# Patient Record
Sex: Female | Born: 1985 | Race: White | Hispanic: No | Smoking: Current some day smoker
Health system: Southern US, Community
[De-identification: ages and names within clinical notes are randomized; demographics above are authoritative.]

## PROBLEM LIST (undated history)

## (undated) DIAGNOSIS — E119 Type 2 diabetes mellitus without complications: Secondary | ICD-10-CM

## (undated) DIAGNOSIS — I10 Essential (primary) hypertension: Secondary | ICD-10-CM

## (undated) DIAGNOSIS — F909 Attention-deficit hyperactivity disorder, unspecified type: Secondary | ICD-10-CM

## (undated) DIAGNOSIS — G43909 Migraine, unspecified, not intractable, without status migrainosus: Secondary | ICD-10-CM

## (undated) DIAGNOSIS — R569 Unspecified convulsions: Secondary | ICD-10-CM

## (undated) DIAGNOSIS — G8929 Other chronic pain: Secondary | ICD-10-CM

## (undated) DIAGNOSIS — C9590 Leukemia, unspecified not having achieved remission: Secondary | ICD-10-CM

## (undated) DIAGNOSIS — F419 Anxiety disorder, unspecified: Secondary | ICD-10-CM

## (undated) DIAGNOSIS — N3010 Interstitial cystitis (chronic) without hematuria: Secondary | ICD-10-CM

## (undated) DIAGNOSIS — F32A Depression, unspecified: Secondary | ICD-10-CM

## (undated) DIAGNOSIS — F329 Major depressive disorder, single episode, unspecified: Secondary | ICD-10-CM

## (undated) DIAGNOSIS — R12 Heartburn: Secondary | ICD-10-CM

## (undated) DIAGNOSIS — N889 Noninflammatory disorder of cervix uteri, unspecified: Secondary | ICD-10-CM

## (undated) DIAGNOSIS — K5900 Constipation, unspecified: Secondary | ICD-10-CM

## (undated) DIAGNOSIS — M1990 Unspecified osteoarthritis, unspecified site: Secondary | ICD-10-CM

## (undated) DIAGNOSIS — C539 Malignant neoplasm of cervix uteri, unspecified: Secondary | ICD-10-CM

## (undated) DIAGNOSIS — R87629 Unspecified abnormal cytological findings in specimens from vagina: Secondary | ICD-10-CM

## (undated) DIAGNOSIS — G4730 Sleep apnea, unspecified: Secondary | ICD-10-CM

## (undated) HISTORY — DX: Anxiety disorder, unspecified: F41.9

## (undated) HISTORY — PX: OTHER SURGICAL HISTORY: SHX169

## (undated) HISTORY — DX: Unspecified abnormal cytological findings in specimens from vagina: R87.629

## (undated) HISTORY — DX: Unspecified osteoarthritis, unspecified site: M19.90

## (undated) HISTORY — DX: Sleep apnea, unspecified: G47.30

## (undated) HISTORY — PX: CARDIAC CATHETERIZATION: SHX172

## (undated) HISTORY — DX: Migraine, unspecified, not intractable, without status migrainosus: G43.909

## (undated) HISTORY — DX: Heartburn: R12

## (undated) HISTORY — PX: TUBAL LIGATION: SHX77

## (undated) HISTORY — PX: LEEP: SHX91

## (undated) HISTORY — DX: Unspecified convulsions: R56.9

---
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
## 2005-02-25 ENCOUNTER — Emergency Department: Payer: Self-pay | Admitting: Emergency Medicine

## 2005-05-03 ENCOUNTER — Emergency Department: Payer: Self-pay | Admitting: General Practice

## 2006-02-16 ENCOUNTER — Emergency Department: Payer: Self-pay | Admitting: Unknown Physician Specialty

## 2007-01-12 ENCOUNTER — Emergency Department: Payer: Self-pay | Admitting: Emergency Medicine

## 2007-06-16 ENCOUNTER — Emergency Department: Payer: Self-pay | Admitting: Emergency Medicine

## 2007-06-16 ENCOUNTER — Other Ambulatory Visit: Payer: Self-pay

## 2008-03-27 ENCOUNTER — Ambulatory Visit: Payer: Self-pay | Admitting: Unknown Physician Specialty

## 2008-05-21 ENCOUNTER — Ambulatory Visit: Payer: Self-pay | Admitting: Urology

## 2009-11-30 ENCOUNTER — Emergency Department: Payer: Self-pay | Admitting: Emergency Medicine

## 2009-12-16 ENCOUNTER — Emergency Department: Payer: Self-pay | Admitting: Emergency Medicine

## 2010-05-11 ENCOUNTER — Emergency Department: Payer: Self-pay | Admitting: Emergency Medicine

## 2010-05-21 ENCOUNTER — Emergency Department: Payer: Self-pay | Admitting: Emergency Medicine

## 2010-10-05 ENCOUNTER — Emergency Department: Payer: Self-pay | Admitting: Internal Medicine

## 2011-10-07 ENCOUNTER — Encounter (HOSPITAL_COMMUNITY): Payer: Self-pay | Admitting: *Deleted

## 2011-10-07 DIAGNOSIS — R10819 Abdominal tenderness, unspecified site: Secondary | ICD-10-CM | POA: Insufficient documentation

## 2011-10-07 DIAGNOSIS — R109 Unspecified abdominal pain: Secondary | ICD-10-CM | POA: Insufficient documentation

## 2011-10-07 DIAGNOSIS — N390 Urinary tract infection, site not specified: Secondary | ICD-10-CM | POA: Insufficient documentation

## 2011-10-07 DIAGNOSIS — Z856 Personal history of leukemia: Secondary | ICD-10-CM | POA: Insufficient documentation

## 2011-10-07 DIAGNOSIS — R110 Nausea: Secondary | ICD-10-CM | POA: Insufficient documentation

## 2011-10-07 NOTE — ED Notes (Signed)
Pt reports abd cramping and vomiting dark red blood starting earlier tonight

## 2011-10-08 ENCOUNTER — Emergency Department (HOSPITAL_COMMUNITY)
Admission: EM | Admit: 2011-10-08 | Discharge: 2011-10-08 | Disposition: A | Discharge status: Home / Self Care | Payer: Self-pay | Attending: Emergency Medicine | Admitting: Emergency Medicine

## 2011-10-08 ENCOUNTER — Emergency Department (HOSPITAL_COMMUNITY): Payer: Self-pay

## 2011-10-08 DIAGNOSIS — R110 Nausea: Secondary | ICD-10-CM

## 2011-10-08 DIAGNOSIS — N390 Urinary tract infection, site not specified: Secondary | ICD-10-CM

## 2011-10-08 HISTORY — DX: Noninflammatory disorder of cervix uteri, unspecified: N88.9

## 2011-10-08 HISTORY — DX: Leukemia, unspecified not having achieved remission: C95.90

## 2011-10-08 LAB — URINALYSIS, ROUTINE W REFLEX MICROSCOPIC
Bilirubin Urine: NEGATIVE
Nitrite: POSITIVE — AB
Specific Gravity, Urine: 1.01 (ref 1.005–1.030)
Urobilinogen, UA: 0.2 mg/dL (ref 0.0–1.0)
pH: 6 (ref 5.0–8.0)

## 2011-10-08 LAB — URINE MICROSCOPIC-ADD ON

## 2011-10-08 LAB — PREGNANCY, URINE: Preg Test, Ur: NEGATIVE

## 2011-10-08 MED ORDER — SODIUM CHLORIDE 0.9 % IV BOLUS (SEPSIS)
1000.0000 mL | Freq: Once | INTRAVENOUS | Status: AC
  Administered 2011-10-08: 1000 mL via INTRAVENOUS

## 2011-10-08 MED ORDER — METRONIDAZOLE 500 MG PO TABS
500.0000 mg | ORAL_TABLET | Freq: Once | ORAL | Status: AC
  Administered 2011-10-08: 500 mg via ORAL
  Filled 2011-10-08: qty 1

## 2011-10-08 MED ORDER — ONDANSETRON HCL 4 MG/2ML IJ SOLN: 4.0000 mg | Freq: Once | INTRAMUSCULAR | Status: DC

## 2011-10-08 MED ORDER — METRONIDAZOLE 500 MG PO TABS: 500.0000 mg | ORAL_TABLET | Freq: Two times a day (BID) | ORAL | Status: AC

## 2011-10-08 MED ORDER — ONDANSETRON HCL 4 MG/2ML IJ SOLN
4.0000 mg | Freq: Once | INTRAMUSCULAR | Status: AC
  Administered 2011-10-08: 4 mg via INTRAVENOUS
  Filled 2011-10-08: qty 2

## 2011-10-08 MED ORDER — CIPROFLOXACIN HCL 250 MG PO TABS
250.0000 mg | ORAL_TABLET | Freq: Once | ORAL | Status: AC
  Administered 2011-10-08: 250 mg via ORAL
  Filled 2011-10-08: qty 1

## 2011-10-08 MED ORDER — CIPROFLOXACIN HCL 250 MG PO TABS
250.0000 mg | ORAL_TABLET | Freq: Two times a day (BID) | ORAL | Status: AC
Start: 2011-10-08 — End: 2011-10-18

## 2011-10-08 MED ORDER — MORPHINE SULFATE 2 MG/ML IJ SOLN
2.0000 mg | Freq: Once | INTRAMUSCULAR | Status: AC
  Administered 2011-10-08: 2 mg via INTRAVENOUS
  Filled 2011-10-08: qty 1

## 2011-10-08 MED ORDER — PROMETHAZINE HCL 25 MG PO TABS: 12.5000 mg | ORAL_TABLET | Freq: Four times a day (QID) | ORAL | Status: DC | PRN

## 2011-10-08 NOTE — ED Provider Notes (Signed)
History     CSN: [id number]  Arrival date & time 10/07/11  2318   First MD Initiated Contact with Patient 10/08/11 0032      Chief Complaint  Patient presents with  . Abdominal Pain    (Consider location/radiation/quality/duration/timing/severity/associated sxs/prior treatment) HPI Bethany Little is a 26 y.o. female who presents to the Emergency Department complaining of abdominal cramping that began earlier tonight was associated with vomiting. Vomit had dark red blood in it. Abdominal cramping his lower abdominal pain with no radiation. She has taken no medicines. She denies fever, chills, shortness of breath, chest pain, diarrhea.  No PCP  Past Medical History  Diagnosis Date  . Leukemia   . Cervical abnormality     Past Surgical History  Procedure Date  . Cesarean section   . Leep     No family history on file.  History  Substance Use Topics  . Smoking status: Current Some Day Smoker  . Smokeless tobacco: Not on file  . Alcohol Use: Yes    OB History    Grav Para Term Preterm Abortions TAB SAB Ect Mult Living                  Review of Systems  Constitutional: Negative for fever.       10 Systems reviewed and are negative for acute change except as noted in the HPI.  HENT: Negative for congestion.   Eyes: Negative for discharge and redness.  Respiratory: Negative for cough and shortness of breath.   Cardiovascular: Negative for chest pain.  Gastrointestinal: Positive for nausea, vomiting and abdominal pain.       Hematemesis  Musculoskeletal: Negative for back pain.  Skin: Negative for rash.  Neurological: Negative for syncope, numbness and headaches.  Psychiatric/Behavioral:       No behavior change.    Allergies  Erythromycin; Pertussis vaccines; and Tetracyclines & related  Home Medications   Current Outpatient Rx  Name Route Sig Dispense Refill  . ORPHENADRINE CITRATE ER 100 MG PO TB12 Oral Take 100 mg by mouth 2 (two) times daily as  needed.      BP 130/84  Pulse 85  Temp(Src) 97.9 F (36.6 C) (Oral)  Resp 18  Ht 5\' 4"  (1.626 m)  Wt 170 lb (77.111 kg)  BMI 29.18 kg/m2  SpO2 100%  LMP 09/21/2011  Physical Exam  Nursing note and vitals reviewed. Constitutional: She is oriented to person, place, and time.       Awake, alert, nontoxic appearance.  HENT:  Head: Atraumatic.  Eyes: Right eye exhibits no discharge. Left eye exhibits no discharge.  Neck: Neck supple.  Cardiovascular: Normal rate, normal heart sounds and intact distal pulses.   Pulmonary/Chest: Effort normal. She exhibits no tenderness.  Abdominal: Soft. Bowel sounds are normal. She exhibits no distension. There is tenderness. There is no rebound and no guarding.       Suprapubic tenderness  Genitourinary:       No cva tenderness  Musculoskeletal: She exhibits no tenderness.       Baseline ROM, no obvious new focal weakness.  Neurological: She is alert and oriented to person, place, and time. She has normal reflexes.       Mental status and motor strength appears baseline for patient and situation.  Skin: No rash noted.  Psychiatric: She has a normal mood and affect.    ED Course  Procedures (including critical care time) Results for orders placed during the hospital encounter of 10/08/11  URINALYSIS, ROUTINE W REFLEX MICROSCOPIC      Component Value Range   Color, Urine YELLOW  YELLOW    APPearance CLEAR  CLEAR    Specific Gravity, Urine 1.010  1.005 - 1.030    pH 6.0  5.0 - 8.0    Glucose, UA NEGATIVE  NEGATIVE (mg/dL)   Hgb urine dipstick MODERATE (*) NEGATIVE    Bilirubin Urine NEGATIVE  NEGATIVE    Ketones, ur NEGATIVE  NEGATIVE (mg/dL)   Protein, ur NEGATIVE  NEGATIVE (mg/dL)   Urobilinogen, UA 0.2  0.0 - 1.0 (mg/dL)   Nitrite POSITIVE (*) NEGATIVE    Leukocytes, UA NEGATIVE  NEGATIVE   PREGNANCY, URINE      Component Value Range   Preg Test, Ur NEGATIVE  NEGATIVE   URINE MICROSCOPIC-ADD ON      Component Value Range    Squamous Epithelial / LPF MANY (*) RARE    WBC, UA 0-2  <3 (WBC/hpf)   RBC / HPF 7-10  <3 (RBC/hpf)   Bacteria, UA MANY (*) RARE    Urine-Other CLUE CELLS PRESENT    Dg Abd Acute W/chest  10/08/2011  *RADIOLOGY REPORT*  Clinical Data: Lower abdominal pain, vaginal spotting, vomiting with dark blood.  ACUTE ABDOMEN SERIES (ABDOMEN 2 VIEW & CHEST 1 VIEW)  Comparison: None.  Findings: Normal heart size and pulmonary vascularity.  No focal consolidation in the lungs.  No blunting of costophrenic angles.  Scattered gas and stool in the colon.  No small or large bowel dilatation.  No free intra-abdominal air.  No abnormal air fluid levels.  No radiopaque stones. Intrauterine device projected over the pelvis.  IMPRESSION: No evidence of active pulmonary disease.  Nonobstructive bowel gas pattern.  Original Report Authenticated By: Marlon Pel, M.D.    [phone] NG was placed. No blood in the stomach. NG was discontinued.  MDM  Patient with complaint of abdominal pain and vomiting. MG did not reveal any blood in the stomach. Urinalysis shows a UTI.Initiated antibiotic therapy. Patient was given IV fluids, analgesics, anti-medic, with improvement. Abdominal series negative for acute findings.Pt stable in ED with no significant deterioration in condition.The patient appears reasonably screened and/or stabilized for discharge and I doubt any other medical condition or other [facility] requiring further screening, evaluation, or treatment in the ED at this time prior to discharge.  MDM Reviewed: nursing note and vitals Interpretation: labs and x-ray           Nicoletta Dress. Colon Branch, MD 10/08/11 [phone]

## 2011-10-08 NOTE — Discharge Instructions (Signed)
Your urine pregnancy was negative. Your urine showed a urinary tract infection and bacterial vaginosis. Both require antibiotics. Your x-ray did not show any acute findings. Use a nausea medicine as needed. Take both of the antibiotics until they are finished. Followup with your doctor.Marland Kitchen

## 2012-01-01 ENCOUNTER — Encounter (HOSPITAL_COMMUNITY): Payer: Self-pay

## 2012-01-01 ENCOUNTER — Emergency Department (HOSPITAL_COMMUNITY)
Admission: EM | Admit: 2012-01-01 | Discharge: 2012-01-01 | Disposition: A | Discharge status: Home / Self Care | Payer: Self-pay | Attending: Emergency Medicine | Admitting: Emergency Medicine

## 2012-01-01 DIAGNOSIS — L259 Unspecified contact dermatitis, unspecified cause: Secondary | ICD-10-CM | POA: Insufficient documentation

## 2012-01-01 DIAGNOSIS — Z856 Personal history of leukemia: Secondary | ICD-10-CM | POA: Insufficient documentation

## 2012-01-01 DIAGNOSIS — L255 Unspecified contact dermatitis due to plants, except food: Secondary | ICD-10-CM

## 2012-01-01 DIAGNOSIS — F17200 Nicotine dependence, unspecified, uncomplicated: Secondary | ICD-10-CM | POA: Insufficient documentation

## 2012-01-01 HISTORY — DX: Interstitial cystitis (chronic) without hematuria: N30.10

## 2012-01-01 MED ORDER — PREDNISONE 10 MG PO TABS: 20.0000 mg | ORAL_TABLET | Freq: Every day | ORAL | Status: DC

## 2012-01-01 MED ORDER — DIPHENHYDRAMINE HCL 25 MG PO CAPS
25.0000 mg | ORAL_CAPSULE | Freq: Four times a day (QID) | ORAL | Status: DC | PRN
  Administered 2012-01-01: 25 mg via ORAL
  Filled 2012-01-01: qty 1

## 2012-01-01 MED ORDER — DIPHENHYDRAMINE HCL 25 MG PO CAPS: 25.0000 mg | ORAL_CAPSULE | Freq: Four times a day (QID) | ORAL | Status: DC | PRN

## 2012-01-01 MED ORDER — PREDNISONE 10 MG PO TABS
40.0000 mg | ORAL_TABLET | Freq: Once | ORAL | Status: AC
Start: 2012-01-01 — End: 2012-01-01
  Administered 2012-01-01: 40 mg via ORAL
  Filled 2012-01-01: qty 4

## 2012-01-01 NOTE — ED Notes (Signed)
Started breaking out in a rash on my right forearm, now breaking out on my face and neck per pt. My right hand started going numb while I was at work today per pt.

## 2012-01-01 NOTE — ED Notes (Signed)
Pt presents to ED with rash to right arm as well as face and neck. Pt states she first noticed the breakout on her arms on Wednesday. Pt states clear drainage comes from areas of the rash. Pt describes areas as burning and itching.

## 2012-01-01 NOTE — ED Provider Notes (Signed)
History     CSN: [id number]  Arrival date & time 01/01/12  1924   First MD Initiated Contact with Patient 01/01/12 1956      Chief Complaint  Patient presents with  . Rash    HPI Bethany Little is a 26 y.o. female who presents to the ED for a rash. The rash started 2 days ago after mowing the lawn. The rash started on the right wrist and now has spread to right forearm, face and left arm. The history was provided by the patient.  Past Medical History  Diagnosis Date  . Leukemia   . Cervical abnormality   . Interstitial cystitis     Past Surgical History  Procedure Date  . Cesarean section   . Leep     History reviewed. No pertinent family history.  History  Substance Use Topics  . Smoking status: Current Some Day Smoker  . Smokeless tobacco: Not on file  . Alcohol Use: Yes    OB History    Grav Para Term Preterm Abortions TAB SAB Ect Mult Living                  Review of Systems  Constitutional: Negative for fever, chills, diaphoresis and fatigue.  HENT: Negative for ear pain, congestion, sore throat, facial swelling, neck pain, neck stiffness, dental problem and sinus pressure.   Eyes: Negative for photophobia, pain and discharge.  Respiratory: Negative for cough, chest tightness and wheezing.   Gastrointestinal: Negative for nausea, vomiting, abdominal pain, diarrhea, constipation and abdominal distention.  Genitourinary: Negative for dysuria, frequency, flank pain and difficulty urinating.  Musculoskeletal: Negative for myalgias, back pain and gait problem.  Skin: Positive for rash. Negative for color change.       Itching   Neurological: Negative for dizziness, speech difficulty, weakness, light-headedness, numbness and headaches.  Psychiatric/Behavioral: Negative for confusion and agitation.    Allergies  Erythromycin; Pertussis vaccines; Tetracyclines & related; and Latex  Home Medications   Current Outpatient Rx  Name Route Sig Dispense Refill   . AMITRIPTYLINE HCL 25 MG PO TABS Oral Take 25 mg by mouth at bedtime.    . DESIPRAMINE HCL 25 MG PO TABS Oral Take 25 mg by mouth daily.    Marland Kitchen LEVONORGESTREL 20 MCG/24HR IU IUD Intrauterine 1 each by Intrauterine route once.    Marland Kitchen PROMETHAZINE HCL 25 MG PO TABS Oral Take 25 mg by mouth every 6 (six) hours as needed. For nausea      BP 130/94  Pulse 88  Temp 98.4 F (36.9 C) (Oral)  Resp 18  Ht 5\' 3"  (1.6 m)  Wt 172 lb (78.019 kg)  BMI 30.47 kg/m2  SpO2 100%  LMP 12/01/2011  Physical Exam  Nursing note and vitals reviewed. Constitutional: She is oriented to person, place, and time. She appears well-developed and well-nourished. No distress.       Elevated blood pressure   HENT:  Head: Atraumatic.       Rash to face    Eyes: EOM are normal. Pupils are equal, round, and reactive to light.  Neck: Normal range of motion. Neck supple.  Cardiovascular: Normal rate, regular rhythm and normal heart sounds.   Pulmonary/Chest: Effort normal and breath sounds normal.  Musculoskeletal: Normal range of motion.       Linear rash noted right wrist and elbow. Few vesicular lesions noted face and left arm.   Neurological: She is alert and oriented to person, place, and time. No cranial nerve  deficit.  Skin: Skin is warm and dry.  Psychiatric: She has a normal mood and affect. Her behavior is normal. Judgment and thought content normal.   Assessment: Contact dermitis  Plan:  Prednisone 40 mg    Benadryl 25 mg po   Follow up with derm as needed Medication List  As of 01/01/2012  8:58 PM   START taking these medications         diphenhydrAMINE 25 mg capsule   Commonly known as: BENADRYL   Take 1 capsule (25 mg total) by mouth every 6 (six) hours as needed for itching.      predniSONE 10 MG tablet   Commonly known as: DELTASONE   Take 2 tablets (20 mg total) by mouth daily.         ASK your doctor about these medications         amitriptyline 25 MG tablet   Commonly known as:  ELAVIL      desipramine 25 MG tablet   Commonly known as: NORPRAMIN      levonorgestrel 20 MCG/24HR IUD   Commonly known as: MIRENA      promethazine 25 MG tablet   Commonly known as: PHENERGAN          Where to get your medications    These are the prescriptions that you need to pick up.   You may get these medications from any pharmacy.         diphenhydrAMINE 25 mg capsule   predniSONE 10 MG tablet           Follow-up Information    Follow up with Fran Lowes, MD. (As needed if symptoms worsen)    Contact information:   [address], [place]. [place] [address] [phone]        I have discussed clinical findings with the patient and plan of care. Patient voices understanding. ED Course  Procedures  MDM          Janne Napoleon, NP 01/01/12 [phone]

## 2012-01-02 NOTE — ED Provider Notes (Signed)
Medical screening examination/treatment/procedure(s) were performed by non-physician practitioner and as supervising physician I was immediately available for consultation/collaboration.   Brihanna Devenport, MD 01/02/12 1502 

## 2012-04-03 ENCOUNTER — Encounter (HOSPITAL_COMMUNITY): Payer: Self-pay

## 2012-04-03 ENCOUNTER — Observation Stay (HOSPITAL_COMMUNITY)
Admission: EM | Admit: 2012-04-03 | Discharge: 2012-04-04 | Payer: Managed Care, Other (non HMO) | Attending: Internal Medicine | Admitting: Internal Medicine

## 2012-04-03 DIAGNOSIS — C9590 Leukemia, unspecified not having achieved remission: Secondary | ICD-10-CM

## 2012-04-03 DIAGNOSIS — T43502A Poisoning by unspecified antipsychotics and neuroleptics, intentional self-harm, initial encounter: Secondary | ICD-10-CM | POA: Insufficient documentation

## 2012-04-03 DIAGNOSIS — Y929 Unspecified place or not applicable: Secondary | ICD-10-CM | POA: Insufficient documentation

## 2012-04-03 DIAGNOSIS — F3289 Other specified depressive episodes: Secondary | ICD-10-CM | POA: Insufficient documentation

## 2012-04-03 DIAGNOSIS — R45851 Suicidal ideations: Secondary | ICD-10-CM | POA: Insufficient documentation

## 2012-04-03 DIAGNOSIS — F17200 Nicotine dependence, unspecified, uncomplicated: Secondary | ICD-10-CM | POA: Insufficient documentation

## 2012-04-03 DIAGNOSIS — T43011A Poisoning by tricyclic antidepressants, accidental (unintentional), initial encounter: Secondary | ICD-10-CM | POA: Insufficient documentation

## 2012-04-03 DIAGNOSIS — T43591A Poisoning by other antipsychotics and neuroleptics, accidental (unintentional), initial encounter: Secondary | ICD-10-CM

## 2012-04-03 DIAGNOSIS — F329 Major depressive disorder, single episode, unspecified: Secondary | ICD-10-CM | POA: Insufficient documentation

## 2012-04-03 HISTORY — DX: Depression, unspecified: F32.A

## 2012-04-03 HISTORY — DX: Major depressive disorder, single episode, unspecified: F32.9

## 2012-04-03 LAB — COMPREHENSIVE METABOLIC PANEL
AST: 14 U/L (ref 0–37)
Albumin: 4.1 g/dL (ref 3.5–5.2)
BUN: 9 mg/dL (ref 6–23)
Calcium: 9.5 mg/dL (ref 8.4–10.5)
Chloride: 104 mEq/L (ref 96–112)
Creatinine, Ser: 0.83 mg/dL (ref 0.50–1.10)
Total Bilirubin: 0.3 mg/dL (ref 0.3–1.2)
Total Protein: 7.3 g/dL (ref 6.0–8.3)

## 2012-04-03 LAB — ETHANOL: Alcohol, Ethyl (B): <11 mg/dL (ref 0–11)

## 2012-04-03 LAB — CBC WITH DIFFERENTIAL/PLATELET
Basophils Absolute: 0.1 10*3/uL (ref 0.0–0.1)
Basophils Relative: 1 % (ref 0–1)
Eosinophils Absolute: 0.1 10*3/uL (ref 0.0–0.7)
Eosinophils Relative: 1 % (ref 0–5)
HCT: 43.7 % (ref 36.0–46.0)
Hemoglobin: 14.8 g/dL (ref 12.0–15.0)
MCH: 31.6 pg (ref 26.0–34.0)
MCHC: 33.9 g/dL (ref 30.0–36.0)
MCV: 93.4 fL (ref 78.0–100.0)
Monocytes Absolute: 0.7 10*3/uL (ref 0.1–1.0)
Monocytes Relative: 7 % (ref 3–12)
Neutro Abs: 6.8 10*3/uL (ref 1.7–7.7)
RDW: 12.5 % (ref 11.5–15.5)

## 2012-04-03 LAB — TROPONIN I: Troponin I: <0.3 ng/mL (ref <0.30)

## 2012-04-03 LAB — URINALYSIS, ROUTINE W REFLEX MICROSCOPIC
Glucose, UA: NEGATIVE mg/dL
Ketones, ur: NEGATIVE mg/dL
Leukocytes, UA: NEGATIVE
Nitrite: NEGATIVE
pH: 5.5 (ref 5.0–8.0)

## 2012-04-03 LAB — PREGNANCY, URINE: Preg Test, Ur: NEGATIVE

## 2012-04-03 LAB — RAPID URINE DRUG SCREEN, HOSP PERFORMED
Amphetamines: NOT DETECTED
Opiates: NOT DETECTED

## 2012-04-03 LAB — SALICYLATE LEVEL: Salicylate Lvl: <2 mg/dL — ABNORMAL LOW (ref 2.8–20.0)

## 2012-04-03 LAB — PROTIME-INR
INR: 1 (ref 0.00–1.49)
Prothrombin Time: 13.1 seconds (ref 11.6–15.2)

## 2012-04-03 MED ORDER — SODIUM CHLORIDE 0.9 % IV BOLUS (SEPSIS)
1000.0000 mL | Freq: Once | INTRAVENOUS | Status: AC
  Administered 2012-04-03: 1000 mL via INTRAVENOUS

## 2012-04-03 MED ORDER — SODIUM CHLORIDE 0.9 % IV SOLN: INTRAVENOUS | Status: DC

## 2012-04-03 MED ORDER — SODIUM BICARBONATE 8.4 % IV SOLN
50.0000 meq | Freq: Once | INTRAVENOUS | Status: AC
  Administered 2012-04-03: 50 meq via INTRAVENOUS
  Filled 2012-04-03: qty 50

## 2012-04-03 MED ORDER — ONDANSETRON HCL 4 MG/2ML IJ SOLN: 4.0000 mg | Freq: Three times a day (TID) | INTRAMUSCULAR | Status: DC | PRN

## 2012-04-03 MED ORDER — SODIUM CHLORIDE 0.9 % IV SOLN
Freq: Once | INTRAVENOUS | Status: AC
  Administered 2012-04-03: 1000 mL via INTRAVENOUS

## 2012-04-03 NOTE — ED Notes (Addendum)
Spoke with Gavin Pound from [employer].  Recommended to monitor for prolonged QRS, and seizures.  Recommended overnight medical admission.

## 2012-04-03 NOTE — H&P (Signed)
[facility] History and Physical  Bethany Little ZOX:[id number] DOB: 1985/10/14 DOA: 04/03/2012   PCP: Dr. Audley Hose at [facility] Specialists: Unknown  Chief Complaint: Overdose  HPI: Bethany Little is a 26 y.o. female with a past medical history of depression, cervical abnormalities of unknown type. 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 She, according to the fianc who provided the history, stated that this was her second adverse report and would mean that the patient would lose her job. Patient was very upset. She came home and then about 8:30 PM patient took about 20 tablets of amitriptyline. There is no indication that the patient took any other medications. She was found to be somnolent at home. She apparently tried to cut herself on the left arm. EMS was called and the patient was brought into the hospital. At this time the patient is arousable, however, is not able to provide any history. According to the fianc she doesn't have a history of suicidal behavior in the past. He also tells me that she was diagnosed with some form of cancer recently and is awaiting insurance, so, that 'chemotherapy' can be initiated.  Home Medications: This list does not appear to be accurate. We'll have pharmacy review.  Prior to Admission medications   Medication Sig Start Date End Date Taking? Authorizing Provider  cefdinir (OMNICEF) 300 MG capsule Take 300 mg by mouth 2 (two) times daily. rx'd 03/27/12   Yes Historical Provider, MD  HYDROcodone-acetaminophen (VICODIN) 5-500 MG per tablet Take 1 tablet by mouth every 6 (six) hours as needed. rx'd 03/27/12  For 20 tabs, now empty   Yes Historical Provider, MD  amitriptyline (ELAVIL) 25 MG tablet Take 25 mg by mouth at bedtime.    Historical Provider, MD  desipramine (NORPRAMIN) 25 MG tablet Take 25 mg by mouth daily.    Historical Provider, MD  diphenhydrAMINE (BENADRYL) 25  mg capsule Take 1 capsule (25 mg total) by mouth every 6 (six) hours as needed for itching. 01/01/12 01/11/12  Hope Orlene Och, NP  levonorgestrel (MIRENA) 20 MCG/24HR IUD 1 each by Intrauterine route once.    Historical Provider, MD  predniSONE (DELTASONE) 10 MG tablet Take 2 tablets (20 mg total) by mouth daily. 01/01/12   Hope Orlene Och, NP  promethazine (PHENERGAN) 25 MG tablet Take 25 mg by mouth every 6 (six) hours as needed. For nausea    Historical Provider, MD    Allergies:  Allergies  Allergen Reactions  . Erythromycin Hives    Childhood allergy  . Pertussis Vaccines Hives    Childhood allergy  . Tetracyclines & Related Hives    Childhood allergy  . Latex Itching and Rash    Past Medical History: Past Medical History  Diagnosis Date  . Leukemia   . Cervical abnormality   . Interstitial cystitis   . Depression     Past Surgical History  Procedure Date  . Cesarean section   . Leep     Social History: According to the fianc she lives in [place]. She has a son. There is a history of smoking cigarettes about 1 pack per day. Occasional alcohol use. No illicit drug use.  Living Situation: Lives with her son Activity Level: Independent with daily activities   Family History: Unable to obtain due to mental status  Review of Systems -unable to do due to mental status  Physical Examination  Filed Vitals:  04/03/12 2132 04/03/12 2137  BP:  123/81  Pulse:  92  TempSrc:  Oral  SpO2: 98% 100%   Unfortunately, examination is limited as the patient was not cooperative. She would push my arms away when I was trying to examine her. I cannot get her to be cooperative.  General appearance: distracted, no distress and uncooperative Head: Normocephalic, without obvious abnormality, atraumatic Eyes: conjunctivae/corneas clear. PERRL Resp: clear to auscultation bilaterally Cardio: regular rate and rhythm, S1, S2 normal, no murmur, click, rub or gallop GI: soft, non-tender; bowel  sounds normal; no masses,  no organomegaly Extremities: Superficial lacerations noted on the left forearm. No active bleeding is noted. Pulses: 2+ and symmetric Neurologic: She is asleep. She does open her eyes on painful stimuli. She does move all her extremities. However, she would not cooperative for examination.  Laboratory Data: Results for orders placed during the hospital encounter of 04/03/12 (from the past 48 hour(s))  CBC WITH DIFFERENTIAL     Status: Normal   Collection Time   04/03/12  9:56 PM      Component Value Range Comment   WBC 10.2  4.0 - 10.5 K/uL    RBC 4.68  3.87 - 5.11 MIL/uL    Hemoglobin 14.8  12.0 - 15.0 g/dL    HCT 16.1  09.6 - 04.5 %    MCV 93.4  78.0 - 100.0 fL    MCH 31.6  26.0 - 34.0 pg    MCHC 33.9  30.0 - 36.0 g/dL    RDW 40.9  81.1 - 91.4 %    Platelets 240  150 - 400 K/uL    Neutrophils Relative 66  43 - 77 %    Neutro Abs 6.8  1.7 - 7.7 K/uL    Lymphocytes Relative 25  12 - 46 %    Lymphs Abs 2.6  0.7 - 4.0 K/uL    Monocytes Relative 7  3 - 12 %    Monocytes Absolute 0.7  0.1 - 1.0 K/uL    Eosinophils Relative 1  0 - 5 %    Eosinophils Absolute 0.1  0.0 - 0.7 K/uL    Basophils Relative 1  0 - 1 %    Basophils Absolute 0.1  0.0 - 0.1 K/uL   COMPREHENSIVE METABOLIC PANEL     Status: Normal   Collection Time   04/03/12  9:56 PM      Component Value Range Comment   Sodium 142  135 - 145 mEq/L    Potassium 3.5  3.5 - 5.1 mEq/L    Chloride 104  96 - 112 mEq/L    CO2 28  19 - 32 mEq/L    Glucose, Bld 91  70 - 99 mg/dL    BUN 9  6 - 23 mg/dL    Creatinine, Ser 7.82  0.50 - 1.10 mg/dL    Calcium 9.5  8.4 - 95.6 mg/dL    Total Protein 7.3  6.0 - 8.3 g/dL    Albumin 4.1  3.5 - 5.2 g/dL    AST 14  0 - 37 U/L    ALT 17  0 - 35 U/L    Alkaline Phosphatase 48  39 - 117 U/L    Total Bilirubin 0.3  0.3 - 1.2 mg/dL    GFR calc non Af Amer >90  >90 mL/min    GFR calc Af Amer >90  >90 mL/min   ETHANOL     Status: Normal   Collection Time  04/03/12   9:56 PM      Component Value Range Comment   Alcohol, Ethyl (B) <11  0 - 11 mg/dL   ACETAMINOPHEN LEVEL     Status: Normal   Collection Time   04/03/12  9:56 PM      Component Value Range Comment   Acetaminophen (Tylenol), Serum <15.0  10 - 30 ug/mL   SALICYLATE LEVEL     Status: Abnormal   Collection Time   04/03/12  9:56 PM      Component Value Range Comment   Salicylate Lvl <2.0 (*) 2.8 - 20.0 mg/dL   PROTIME-INR     Status: Normal   Collection Time   04/03/12  9:56 PM      Component Value Range Comment   Prothrombin Time 13.1  11.6 - 15.2 seconds    INR 1.00  0.00 - 1.49   TROPONIN I     Status: Normal   Collection Time   04/03/12  9:56 PM      Component Value Range Comment   Troponin I <0.30  <0.30 ng/mL   PREGNANCY, URINE     Status: Normal   Collection Time   04/03/12 10:11 PM      Component Value Range Comment   Preg Test, Ur NEGATIVE  NEGATIVE   URINALYSIS, ROUTINE W REFLEX MICROSCOPIC     Status: Normal   Collection Time   04/03/12 10:11 PM      Component Value Range Comment   Color, Urine YELLOW  YELLOW    APPearance CLEAR  CLEAR    Specific Gravity, Urine 1.020  1.005 - 1.030    pH 5.5  5.0 - 8.0    Glucose, UA NEGATIVE  NEGATIVE mg/dL    Hgb urine dipstick NEGATIVE  NEGATIVE    Bilirubin Urine NEGATIVE  NEGATIVE    Ketones, ur NEGATIVE  NEGATIVE mg/dL    Protein, ur NEGATIVE  NEGATIVE mg/dL    Urobilinogen, UA 0.2  0.0 - 1.0 mg/dL    Nitrite NEGATIVE  NEGATIVE    Leukocytes, UA NEGATIVE  NEGATIVE MICROSCOPIC NOT DONE ON URINES WITH NEGATIVE PROTEIN, BLOOD, LEUKOCYTES, NITRITE, OR GLUCOSE <1000 mg/dL.  URINE RAPID DRUG SCREEN (HOSP PERFORMED)     Status: Abnormal   Collection Time   04/03/12 10:11 PM      Component Value Range Comment   Opiates NONE DETECTED  NONE DETECTED    Cocaine NONE DETECTED  NONE DETECTED    Benzodiazepines NONE DETECTED  NONE DETECTED    Amphetamines NONE DETECTED  NONE DETECTED    Tetrahydrocannabinol POSITIVE (*) NONE  DETECTED    Barbiturates NONE DETECTED  NONE DETECTED   CK     Status: Normal   Collection Time   04/03/12 10:45 PM      Component Value Range Comment   Total CK 82  7 - 177 U/L     Radiology Reports: No results found.  Electrocardiogram: Independently reviewed. EKG shows a sinus rhythm at 87 beats per minute. Normal axis. Intervals are normal. QRS is 98. No Q waves. No concerning ST or T-wave changes are noted.  Assessment/Plan  Principal Problem:  *Amitriptyline overdose Active Problems:  Suicidal intent  Depression   #1 amitriptyline overdose: Her QRS interval is normal. Continue to monitor her in step down with telemetry. Supportive treatment will be provided. EKG will be repeated. She is appears to be maintaining her airway at this time. Once she is medically stable she will be seen  by behavioral health.  #2 suicidal attempt: As mentioned, above, she'll be seen by behavioral health, when she is medically ready. Sitter will be useful utilized for suicidal precautions.  #3 history of depression: As above,  We'll have pharmacy reconcile her medications as it is unclear if the medication list is accurate. It appears that some of the older medications may be on the list.  Code Status: Full code Family Communication: Her fianc is at the, bedside  Disposition Plan: Unclear at this time   [facility]  [facility] Pager [phone]  If 7PM-7AM, please contact night-coverage [email or web address] Password [facility]  04/03/2012, 11:33 PM

## 2012-04-03 NOTE — ED Provider Notes (Signed)
History   This chart was scribed for Glynn Octave, MD, by Marcina Millard scribe. The patient was seen in room APA02/APA02 and the patient's care was started at 2148.    CSN: [id number]  Arrival date & time 04/03/12  2153   First MD Initiated Contact with Patient 04/03/12 2148      Chief Complaint  Patient presents with  . Drug Overdose    (Consider location/radiation/quality/duration/timing/severity/associated sxs/prior treatment) HPI Comments: Bethany Little is a 26 y.o. female who presents to the Emergency Department complaining of an overdose from taking 20 amitriptyline at 20:30. Her family reports that they are unsure why she took them. She is a Level 5 [employer].    Past Medical History  Diagnosis Date  . Leukemia   . Cervical abnormality   . Interstitial cystitis   . Depression     Past Surgical History  Procedure Date  . Cesarean section   . Leep     History reviewed. No pertinent family history.  History  Substance Use Topics  . Smoking status: Current Some Day Smoker -- 1.0 packs/day  . Smokeless tobacco: Not on file  . Alcohol Use: Yes    OB History    Grav Para Term Preterm Abortions TAB SAB Ect Mult Living                  Review of Systems  Unable to perform ROS: Mental status change   A complete 10 system review of systems was obtained and all systems are negative except as noted in the HPI and PMH.   Allergies  Erythromycin; Pertussis vaccines; Tetracyclines & related; and Latex  Home Medications   Current Outpatient Rx  Name  Route  Sig  Dispense  Refill  . CEFDINIR 300 MG PO CAPS   Oral   Take 300 mg by mouth 2 (two) times daily. rx'd 03/27/12         . HYDROCODONE-ACETAMINOPHEN 5-500 MG PO TABS   Oral   Take 1 tablet by mouth every 6 (six) hours as needed. rx'd 03/27/12  For 20 tabs, now empty         . AMITRIPTYLINE HCL 25 MG PO TABS   Oral   Take 25 mg by mouth at bedtime.         . DESIPRAMINE HCL 25 MG PO TABS  Oral   Take 25 mg by mouth daily.         Marland Kitchen DIPHENHYDRAMINE HCL 25 MG PO CAPS   Oral   Take 1 capsule (25 mg total) by mouth every 6 (six) hours as needed for itching.   30 capsule   0   . LEVONORGESTREL 20 MCG/24HR IU IUD   Intrauterine   1 each by Intrauterine route once.         Marland Kitchen PREDNISONE 10 MG PO TABS   Oral   Take 2 tablets (20 mg total) by mouth daily.   16 tablet   0   . PROMETHAZINE HCL 25 MG PO TABS   Oral   Take 25 mg by mouth every 6 (six) hours as needed. For nausea           BP 97/59  Pulse 91  Resp 14  SpO2 98%  LMP 03/20/2012  Physical Exam  Nursing note and vitals reviewed. Constitutional: She is oriented to person, place, and time. She appears well-developed and well-nourished. No distress.       She is obtunded and arouses to voice commands.  HENT:  Head: Normocephalic and atraumatic.  Eyes: EOM are normal. Pupils are equal, round, and reactive to light.       Her pupils are equal and reactive.  Neck: Normal range of motion. Neck supple. No tracheal deviation present.  Cardiovascular: Normal rate.   Pulmonary/Chest: Effort normal. No respiratory distress.  Abdominal: Soft. She exhibits no distension.  Musculoskeletal: Normal range of motion. She exhibits no edema.  Neurological: She is alert and oriented to person, place, and time.       She follows commands.   Skin: Skin is warm and dry.       She has a superficial laceration to her left forearm.  Psychiatric: She has a normal mood and affect. Her behavior is normal.    ED Course  Procedures (including critical care time)  DIAGNOSTIC STUDIES: Oxygen Saturation is 100% on room air, normal by my interpretation.    COORDINATION OF CARE:  21:57- Performed physical exam. Ordered blood work and an IV.  Labs Reviewed  SALICYLATE LEVEL - Abnormal; Notable for the following:    Salicylate Lvl <2.0 (*)     All other components within normal limits  URINE RAPID DRUG SCREEN (HOSP  PERFORMED) - Abnormal; Notable for the following:    Tetrahydrocannabinol POSITIVE (*)     All other components within normal limits  CBC WITH DIFFERENTIAL  COMPREHENSIVE METABOLIC PANEL  PREGNANCY, URINE  URINALYSIS, ROUTINE W REFLEX MICROSCOPIC  ETHANOL  ACETAMINOPHEN LEVEL  PROTIME-INR  TROPONIN I  CK   No results found.   1. Tricyclic overdose   2. Amitriptyline overdose   3. Depression   4. Suicidal intent       MDM  Intentional overdose of amitriptyline Vicodin around 8:30 PM. Patient is somnolent and unable to provide a history. She is protecting her airway but not awake enough to tolerate charcoal.  QRS normal at this time. Toxicology labs negative. Discussed with poison control recommends admission medically for observation. Benzodiazepines as needed for seizures, bicarbonate as needed for QRS prolongation.   Date: 04/03/2012   Rate: 87  Rhythm: normal sinus rhythm  QRS Axis: normal  Intervals: normal  ST/T Wave abnormalities: normal  Conduction Disutrbances:none  Narrative Interpretation:   Old EKG Reviewed: none available  CRITICAL CARE Performed by: Glynn Octave   Total critical care time: 30  Critical care time was exclusive of separately billable procedures and treating other patients.  Critical care was necessary to treat or prevent imminent or life-threatening deterioration.  Critical care was time spent personally by me on the following activities: development of treatment plan with patient and/or surrogate as well as nursing, discussions with consultants, evaluation of patient's response to treatment, examination of patient, obtaining history from patient or surrogate, ordering and performing treatments and interventions, ordering and review of laboratory studies, ordering and review of radiographic studies, pulse oximetry and re-evaluation of patient's condition.    I personally performed the services described in this documentation, which  was scribed in my presence. The recorded information has been reviewed and is accurate.       Glynn Octave, MD 04/04/12 [phone]

## 2012-04-03 NOTE — ED Notes (Signed)
Family reports pt took 20 amitriptyline approx 8:30pm, unkn reason

## 2012-04-04 ENCOUNTER — Other Ambulatory Visit: Payer: Self-pay

## 2012-04-04 ENCOUNTER — Inpatient Hospital Stay (HOSPITAL_COMMUNITY)
Admission: AD | Admit: 2012-04-04 | Discharge: 2012-04-07 | DRG: 881 | Disposition: A | Discharge status: Home / Self Care | Payer: Medicaid Other | Source: Ambulatory Visit | Attending: Psychiatry | Admitting: Psychiatry

## 2012-04-04 ENCOUNTER — Encounter (HOSPITAL_COMMUNITY): Payer: Self-pay | Admitting: *Deleted

## 2012-04-04 DIAGNOSIS — R42 Dizziness and giddiness: Secondary | ICD-10-CM | POA: Diagnosis not present

## 2012-04-04 DIAGNOSIS — R030 Elevated blood-pressure reading, without diagnosis of hypertension: Secondary | ICD-10-CM | POA: Diagnosis not present

## 2012-04-04 DIAGNOSIS — T438X2A Poisoning by other psychotropic drugs, intentional self-harm, initial encounter: Secondary | ICD-10-CM | POA: Diagnosis present

## 2012-04-04 DIAGNOSIS — Z79899 Other long term (current) drug therapy: Secondary | ICD-10-CM

## 2012-04-04 DIAGNOSIS — F1210 Cannabis abuse, uncomplicated: Secondary | ICD-10-CM | POA: Diagnosis present

## 2012-04-04 DIAGNOSIS — F17200 Nicotine dependence, unspecified, uncomplicated: Secondary | ICD-10-CM | POA: Diagnosis present

## 2012-04-04 DIAGNOSIS — F3289 Other specified depressive episodes: Principal | ICD-10-CM | POA: Diagnosis present

## 2012-04-04 DIAGNOSIS — T43205A Adverse effect of unspecified antidepressants, initial encounter: Secondary | ICD-10-CM | POA: Diagnosis not present

## 2012-04-04 DIAGNOSIS — T43011A Poisoning by tricyclic antidepressants, accidental (unintentional), initial encounter: Secondary | ICD-10-CM | POA: Diagnosis present

## 2012-04-04 DIAGNOSIS — H538 Other visual disturbances: Secondary | ICD-10-CM | POA: Diagnosis not present

## 2012-04-04 DIAGNOSIS — R609 Edema, unspecified: Secondary | ICD-10-CM | POA: Diagnosis not present

## 2012-04-04 DIAGNOSIS — C9590 Leukemia, unspecified not having achieved remission: Secondary | ICD-10-CM | POA: Insufficient documentation

## 2012-04-04 DIAGNOSIS — F329 Major depressive disorder, single episode, unspecified: Secondary | ICD-10-CM

## 2012-04-04 DIAGNOSIS — F411 Generalized anxiety disorder: Secondary | ICD-10-CM | POA: Diagnosis present

## 2012-04-04 DIAGNOSIS — T43502A Poisoning by unspecified antipsychotics and neuroleptics, intentional self-harm, initial encounter: Secondary | ICD-10-CM | POA: Diagnosis present

## 2012-04-04 DIAGNOSIS — R45851 Suicidal ideations: Secondary | ICD-10-CM

## 2012-04-04 LAB — COMPREHENSIVE METABOLIC PANEL
Albumin: 3.2 g/dL — ABNORMAL LOW (ref 3.5–5.2)
Alkaline Phosphatase: 38 U/L — ABNORMAL LOW (ref 39–117)
BUN: 10 mg/dL (ref 6–23)
Calcium: 8.6 mg/dL (ref 8.4–10.5)
Creatinine, Ser: 0.79 mg/dL (ref 0.50–1.10)
GFR calc Af Amer: >90 mL/min (ref >90)
Glucose, Bld: 89 mg/dL (ref 70–99)
Potassium: 3.6 mEq/L (ref 3.5–5.1)
Total Protein: 5.9 g/dL — ABNORMAL LOW (ref 6.0–8.3)

## 2012-04-04 LAB — CBC
HCT: 39 % (ref 36.0–46.0)
MCV: 94.2 fL (ref 78.0–100.0)
RBC: 4.14 MIL/uL (ref 3.87–5.11)
RDW: 12.6 % (ref 11.5–15.5)
WBC: 9 10*3/uL (ref 4.0–10.5)

## 2012-04-04 MED ORDER — MAGNESIUM HYDROXIDE 400 MG/5ML PO SUSP: 30.0000 mL | Freq: Every day | ORAL | Status: DC | PRN

## 2012-04-04 MED ORDER — SODIUM CHLORIDE 0.9 % IJ SOLN
3.0000 mL | Freq: Two times a day (BID) | INTRAMUSCULAR | Status: DC
  Administered 2012-04-04: 3 mL via INTRAVENOUS
  Filled 2012-04-04: qty 3

## 2012-04-04 MED ORDER — ONDANSETRON HCL 4 MG PO TABS
4.0000 mg | ORAL_TABLET | Freq: Four times a day (QID) | ORAL | Status: DC | PRN
  Filled 2012-04-04: qty 1

## 2012-04-04 MED ORDER — NICOTINE 21 MG/24HR TD PT24
21.0000 mg | MEDICATED_PATCH | Freq: Every day | TRANSDERMAL | Status: DC
  Administered 2012-04-05 – 2012-04-07 (×3): 21 mg via TRANSDERMAL
  Filled 2012-04-04 (×5): qty 1

## 2012-04-04 MED ORDER — SODIUM CHLORIDE 0.9 % IV SOLN
INTRAVENOUS | Status: DC
  Administered 2012-04-04 (×2): via INTRAVENOUS

## 2012-04-04 MED ORDER — ACETAMINOPHEN 325 MG PO TABS
650.0000 mg | ORAL_TABLET | Freq: Four times a day (QID) | ORAL | Status: DC | PRN
  Administered 2012-04-04: 650 mg via ORAL
  Filled 2012-04-04: qty 2

## 2012-04-04 MED ORDER — ALUM & MAG HYDROXIDE-SIMETH 200-200-20 MG/5ML PO SUSP: 30.0000 mL | ORAL | Status: DC | PRN

## 2012-04-04 MED ORDER — ONDANSETRON HCL 4 MG/2ML IJ SOLN
4.0000 mg | Freq: Four times a day (QID) | INTRAMUSCULAR | Status: DC | PRN
  Administered 2012-04-04 (×2): 4 mg via INTRAVENOUS
  Filled 2012-04-04: qty 2

## 2012-04-04 MED ORDER — ACETAMINOPHEN 650 MG RE SUPP: 650.0000 mg | Freq: Four times a day (QID) | RECTAL | Status: DC | PRN

## 2012-04-04 MED ORDER — ACETAMINOPHEN 325 MG PO TABS
650.0000 mg | ORAL_TABLET | Freq: Four times a day (QID) | ORAL | Status: DC | PRN
  Administered 2012-04-06 – 2012-04-07 (×3): 650 mg via ORAL

## 2012-04-04 MED ORDER — NICOTINE 14 MG/24HR TD PT24: 1.0000 | MEDICATED_PATCH | Freq: Every day | TRANSDERMAL | Status: DC

## 2012-04-04 MED ORDER — TRAZODONE HCL 50 MG PO TABS
50.0000 mg | ORAL_TABLET | Freq: Every evening | ORAL | Status: DC | PRN
  Administered 2012-04-05 – 2012-04-06 (×4): 50 mg via ORAL
  Filled 2012-04-04 (×4): qty 1
  Filled 2012-04-04 (×2): qty 28
  Filled 2012-04-04 (×2): qty 1
  Filled 2012-04-04: qty 28

## 2012-04-04 MED ORDER — ALBUTEROL SULFATE (5 MG/ML) 0.5% IN NEBU: 2.5000 mg | INHALATION_SOLUTION | RESPIRATORY_TRACT | Status: DC | PRN

## 2012-04-04 MED ORDER — NICOTINE 14 MG/24HR TD PT24
14.0000 mg | MEDICATED_PATCH | Freq: Every day | TRANSDERMAL | Status: DC
  Administered 2012-04-04: 14 mg via TRANSDERMAL
  Filled 2012-04-04: qty 1

## 2012-04-04 NOTE — Tx Team (Signed)
Initial Interdisciplinary Treatment Plan  PATIENT STRENGTHS: (choose at least two) Ability for insight Active sense of humor Average or above average intelligence Capable of independent living Communication skills General fund of knowledge Motivation for treatment/growth Physical Health Work skills  PATIENT STRESSORS: Financial difficulties Health problems Legal issue Marital or family conflict Occupational concerns Substance abuse   PROBLEM LIST: Problem List/Patient Goals Date to be addressed Date deferred Reason deferred Estimated date of resolution  "I didn't want to come, because I know I screwed up"            Depression 04/04/12     Increased risk for suicide 04/04/12     Substance abuse 04/04/12                              DISCHARGE CRITERIA:  Ability to meet basic life and health needs Adequate post-discharge living arrangements Improved stabilization in mood, thinking, and/or behavior Medical problems require only outpatient monitoring Motivation to continue treatment in a less acute level of care Need for constant or close observation no longer present Reduction of life-threatening or endangering symptoms to within safe limits Safe-care adequate arrangements made Verbal commitment to aftercare and medication compliance  PRELIMINARY DISCHARGE PLAN: Attend 12-step recovery group Outpatient therapy Participate in family therapy Return to previous living arrangement Return to previous work or school arrangements  PATIENT/FAMIILY INVOLVEMENT: This treatment plan has been presented to and reviewed with the patient, Bethany Little, and/or family member.  The patient and family have been given the opportunity to ask questions and make suggestions.  Fransico Michael [facility] 04/04/2012, 9:36 PM

## 2012-04-04 NOTE — Addendum Note (Signed)
Addended by: Norval Gable on: 04/04/2012 03:35 PM   Modules accepted: Orders

## 2012-04-04 NOTE — Progress Notes (Signed)
Patient ID: Bethany Little, female   DOB: 11/10/1985, 26 y.o.   MRN: [id number] Pt Was pleasant and cooperative, but tearful and depressed during the adm process. Stated that she's been dealing with a lot of stress but realizes that she did the wrong thing by over dosing on meds. Stated she remembers taking 5, but doesn't remember taking more. Stated she "just wanted to get some sleep".  States she lives with her father, and her boyfriend. States she also has primary custody of her 87yr child.  Pt states she's responsible for paying all the utility bills and buying food. States her boyfriend pays 500.00 child support and is unable to help. Pt has custody hearing over 5 yr and the child is with it's father at this time. Pt's uds tested positive for THC, but she denies using, stating she was around people that were smoking. Report states pt lost her job, but she denies. Stated, "unless they find out I'm in here". Pt became more tearful when she asked if DSS would be called. Informed pt that they may have been notified at the hosp, however she would need to speak with her CM in the morning. Pt denies SI, HI, A/V.

## 2012-04-04 NOTE — BHH Counselor (Signed)
The patient was seen earlier today before medical clearance was given. Later today Dr Brien Few called and informed writer that the patient was now clear and ready for discharge. He agrees that inpatient treatment is indicated and asked that ACT assist in obtaining inpatient treatment for the patient. She is a  [facility] resident and is coverd by [facility]. Called [facility] and they do accept [facility]. They requested that a referral be sent to them for consideration. Patient also referred to [facility].

## 2012-04-04 NOTE — Clinical Social Work Psychosocial (Signed)
    Clinical Social Work Department BRIEF PSYCHOSOCIAL ASSESSMENT 04/04/2012  Patient:  Bethany Little,Bethany Little     Account Number:  [id number]     Admit date:  04/03/2012  Clinical Social Worker:  Santa Genera, CLINICAL SOCIAL WORKER  Date/Time:  04/04/2012 10:00 AM  Referred by:  Physician  Date Referred:  04/04/2012 Referred for  Behavioral Health Issues   Other Referral:   Interview type:  Patient Other interview type:    PSYCHOSOCIAL DATA Living Status:  FAMILY Admitted from facility:   Level of care:   Primary support name:  Clint Bolder Primary support relationship to patient:  FRIEND Degree of support available:   Limited    CURRENT CONCERNS Current Concerns  Behavioral Health Issues   Other Concerns:    SOCIAL WORK ASSESSMENT / PLAN CSW met w patient at bedside, patient alert and oriented. Patient tearful, has been told that she needs inpatient psychiatric hospitalization to treat her depression. Patient overdosed last night, said she "just wanted to go to sleep" and was upset due to second poor review at work and concerns about her 93 year old son.  She is estranged from child's father, patient provides most of the care and support for child.  However, child has frequent visits w father, and patient says father is not supportive of her as a parent.  Patient currently lives w her father, who requires significant payment for living expenses.  Patient is new hire at grocery store, feels she is vulnerable to losing her job due to [employer] of poor performance appraisals and absences.  Patient feels that if she is hospitalized she will lose her job and will also lose custody of her son.  Fears chlid's father will allege she is unable to parent due to mental health concerns.  Patient adamant that she does not want inpatient treatment and states she is willing to do "anything" outpatient.    Patient says this is first suicide attempt, regrets the "trouble it has caused."  Has  sporadic history of psychiatric treatment at [facility], has had multiple providers and many changes in medications.  Feels treatment has been ineffective.  Lives in [place], seeks treatment in [place].  Realizes resources are limited in her area.    Discussed need to follow medical recommendations for needed mental health treatment and patient agreed that she will speak w MD.  However, it is patient's strong desire to be at work tomorrow at 6 AM in order to keep her job.    CSW concluded interview when family members arrived to visit w patient.   Assessment/plan status:  Other - See comment Other assessment/ plan:   CSW will follow recommendations of ACT team and MD and offer support as needed.   Information/referral to community resources:   None at this time    PATIENT'S/FAMILY'S RESPONSE TO PLAN OF CARE: Wants referrals to mental health treatment in her area.    Santa Genera, LCSW Clinical Social Worker [phone])

## 2012-04-04 NOTE — BH Assessment (Addendum)
Assessment Note   Bethany Little is an 26 y.o. female. The patient came to the ED after an intentional overdose on 20 Amitripilyne pills. According to her boyfriend she has received her 2nd bad review at work and will lose her job. She came home and because she was upset she sent her son home with her ex-spouse. After they left she took the pill. She told Clinical research associate that she just wanted  To go to sleep and not wake up. She says she does not know why she did this. She denies any previous attempts on her life. She denies any previous inpatient treatment. She was followed in [facility] by [facility] for a time but is not seeing anyone at this time. She is not on any medications for depression nor anxiety.  She is not homicidal nor does she have a history of violence. She is neither hallucinated nor delusional.  She  Became very tearful when told that she needs jnpatient treatment to resume medications. She cannot contract for safety. Her hospitalist is not available at this time. We will discuss inpatient care after he sees the patient and can provide medical clearance.   Axis I: Major Depression, Recurrent severe Axis II: Deferred Axis III:  Past Medical History  Diagnosis Date  . Leukemia   . Cervical abnormality   . Interstitial cystitis   . Depression    Axis IV: economic problems, occupational problems and problems with access to health care services Axis V: 21-30 behavior considerably influenced by delusions or hallucinations OR serious impairment in judgment, communication OR inability to function in almost all areas  Past Medical History:  Past Medical History  Diagnosis Date  . Leukemia   . Cervical abnormality   . Interstitial cystitis   . Depression     Past Surgical History  Procedure Date  . Cesarean section   . Leep     Family History: History reviewed. No pertinent family history.  Social History:  reports that she has been smoking.  She does not have any smokeless  tobacco history on file. She reports that she drinks alcohol. Her drug history not on file.  Additional Social History:     CIWA: CIWA-Ar BP: 103/75 mmHg Pulse Rate: 82  COWS:    Allergies:  Allergies  Allergen Reactions  . Erythromycin Hives    Childhood allergy  . Pertussis Vaccines Hives    Childhood allergy  . Tetracyclines & Related Hives    Childhood allergy  . Latex Itching and Rash    Home Medications:  Medications Prior to Admission  Medication Sig Dispense Refill  . amitriptyline (ELAVIL) 25 MG tablet Take 25 mg by mouth at bedtime.      . cefdinir (OMNICEF) 300 MG capsule Take 600 mg by mouth daily.      Marland Kitchen desipramine (NORPRAMIN) 25 MG tablet Take 25 mg by mouth daily as needed. Muscle Spasms      . HYDROcodone-acetaminophen (VICODIN) 5-500 MG per tablet Take 1 tablet by mouth every 6 (six) hours as needed. rx'd 03/27/12  For 20 tabs, now empty      . promethazine (PHENERGAN) 25 MG tablet Take 25 mg by mouth every 6 (six) hours as needed. For nausea      . diphenhydrAMINE (BENADRYL) 25 mg capsule Take 1 capsule (25 mg total) by mouth every 6 (six) hours as needed for itching.  30 capsule  0  . levonorgestrel (MIRENA) 20 MCG/24HR IUD 1 each by Intrauterine route once.  OB/GYN Status:  Patient's last menstrual period was 03/20/2012.  General Assessment Data Location of Assessment: [facility] ([facility]) ACT Assessment: Yes Living Arrangements: Spouse/significant other Can pt return to current living arrangement?: Yes Admission Status: Voluntary Is patient capable of signing voluntary admission?: Yes Transfer from: Acute Hospital Referral Source: Medical Floor Inpatient  Education Status Is patient currently in school?: No  Risk to self Suicidal Ideation: Yes-Currently Present Suicidal Intent: Yes-Currently Present Is patient at risk for suicide?: Yes Suicidal Plan?: Yes-Currently Present Specify Current Suicidal Plan: patient took 20 amitriplyne after getting  a bad review at work Access to [employer]: Yes Specify Access to Suicidal Means: had medications at home What has been your use of drugs/alcohol within the last 12 months?: ad;mits to occassional etoh use Previous Attempts/Gestures: No How many times?: 0  Other Self Harm Risks: she did cut her arm superfically Triggers for Past Attempts:  (depression) Intentional Self Injurious Behavior: None Family Suicide History: No Recent stressful life event(s): Financial Problems;Recent negative physical changes Persecutory voices/beliefs?: No Depression: Yes Depression Symptoms: Insomnia;Tearfulness;Isolating;Loss of interest in usual pleasures;Feeling worthless/self pity Substance abuse history and/or treatment for substance abuse?: No (occassional etoh;denies street drugs but was + for [facility]) Suicide prevention information given to non-admitted patients: Not applicable  Risk to Others Homicidal Ideation: No Thoughts of Harm to Others: No Current Homicidal Intent: No Current Homicidal Plan: No Access to Homicidal Means: No History of harm to others?: No Assessment of Violence: None Noted Does patient have access to weapons?: No Criminal Charges Pending?: No Does patient have a court date: No  Psychosis Hallucinations: None noted Delusions: None noted  Mental Status Report Appear/Hygiene: Disheveled Eye Contact: Fair Motor Activity: Freedom of movement;Restlessness Speech: Slurred;Logical/coherent Level of Consciousness: Crying;Sedated Mood: Depressed;Anhedonia Affect: Depressed Anxiety Level: Minimal Thought Processes: Coherent;Relevant Judgement: Unimpaired Orientation: Person;Place;Time;Situation Obsessive Compulsive Thoughts/Behaviors: Minimal  Cognitive Functioning Concentration: Decreased Memory: Recent Impaired;Remote Impaired IQ: Average Insight: Poor Impulse Control: Poor Appetite: Poor Weight Loss: 0  (weight up and down) Weight Gain: 0  (weight up and down) Sleep:  Decreased Total Hours of Sleep: 4  (up and down during the night) Vegetative Symptoms: None  ADLScreening [facility] Assessment Services) Patient's cognitive ability adequate to safely complete daily activities?: Yes Patient able to express need for assistance with ADLs?: Yes Independently performs ADLs?: Yes (appropriate for developmental age)  Abuse/Neglect [facility]) Physical Abuse: Denies Verbal Abuse: Denies Sexual Abuse: Denies  Prior Inpatient Therapy Prior Inpatient Therapy: No  Prior Outpatient Therapy Prior Outpatient Therapy: Yes Prior Therapy Dates: 2012 Prior Therapy Facilty/Provider(s): [facility] [facility] Reason for Treatment: depression/medications  ADL Screening (condition at time of admission) Patient's cognitive ability adequate to safely complete daily activities?: Yes Patient able to express need for assistance with ADLs?: Yes Independently performs ADLs?: Yes (appropriate for developmental age) Weakness of Legs: Both Weakness of Arms/Hands: Both  Home Assistive Devices/Equipment Home Assistive Devices/Equipment: None  Therapy Consults (therapy consults require a physician order) PT Evaluation Needed: No OT Evalulation Needed: No SLP Evaluation Needed: No Abuse/Neglect Assessment (Assessment to be complete while patient is alone) Physical Abuse: Denies Verbal Abuse: Denies Sexual Abuse: Denies Exploitation of patient/patient's resources: Denies Self-Neglect: Denies Values / Beliefs Cultural Requests During Hospitalization: None Spiritual Requests During Hospitalization: None Consults Spiritual Care Consult Needed: No Social Work Consult Needed: Yes (Comment) Advance Directives (For Healthcare) Advance Directive: Patient does not have advance directive;Patient would not like information Pre-existing out of facility DNR order (yellow form or pink MOST form): No Nutrition Screen- [facility]  Adult/[facility] Patient's home diet: Regular Have you recently lost weight  without trying?: No Have you been eating poorly because of a decreased appetite?: No Malnutrition Screening Tool Score: 0   Additional Information 1:1 In Past 12 Months?: No CIRT Risk: No Elopement Risk: No Does patient have medical clearance?: Yes (physician will clear patient after lunch)     Disposition:Remain on medical floor. Spoke with Dr. Brien Few, patient is now medically clear and ready to transfer to inpatient psychiatric care. Patient has been medically cleared by Dr Brien Few. Patient accepted to [facility] as a voluntary admission.  Dr Brien Few in agreement with this disposition. Disposition Disposition of Patient: Other dispositions Other disposition(s):  (remain on medical floor until seen by physician)  On Site Evaluation by:   Reviewed with Physician:     Jearld Pies 04/04/2012 10:25 AM

## 2012-04-04 NOTE — Progress Notes (Signed)
Report called to [facility]. Patient aware of transfer. Tearful but understanding of why she needs to be transferred. Awaiting carelink.

## 2012-04-04 NOTE — Progress Notes (Signed)
UR Chart Review Completed  

## 2012-04-04 NOTE — Progress Notes (Signed)
[facility] PROGRESS NOTE  Jacquana Manteufel WGN:[id number] DOB: [birth year] DOA: 04/03/2012 PCP: No primary provider on file.  Assessment/Plan: Principal Problem:  *Amitriptyline overdose Active Problems:  Suicidal intent  Depression    1. Amitriptyline overdose: Patient ingested about 20 x 25 mg Amitriptyline. Fortunately, she has remained asymptomatic, showed no arrhythmias on telemetry. Repeat EKG shows normal Q-TC.  2. Suicidal attempt: Patient has been evaluated by behavioral health, and is currently on suicide precautions/1:1 Sitter. Inpatient treatment at [facility], has been recommended.  3. History of depression: See above discussion.   Code Status: Full Code. Family Communication:  Disposition Plan: Medically stable for transfer to [facility]. Cleared medically.   Brief narrative: HPI: Bethany Little is a 26 y.o. female with a past medical history of depression, cervical abnormalities of unknown type. 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 She, according to the fianc who provided the history, stated that this was her second adverse report and this would mean that the patient would lose her job. Patient was very upset, came home and then about 8:30 PM, ingested about 20 tablets of amitriptyline, and apparently tried to cut herself on the left arm. She was found to be somnolent at home, EMS was called and the patient was brought into the hospital. According to the fianc, patient has no previous history of suicidal behavior. He also says that she was diagnosed with some form of cancer recently and is awaiting insurance, so, that "chemotherapy" can be initiated.   Consultants:  Psychiatry.   Procedures:  N/A.   Antibiotics:  N/A  HPI/Subjective: Asymptomatic  Objective: Vital signs in last 24 hours: Temp:  [98.5 F (36.9 C)-98.9 F (37.2 C)] 98.9 F (37.2 C) (11/12 0800) Pulse  Rate:  [77-93] 82  (11/12 0900) Resp:  [0-24] 14  (11/12 0800) BP: (95-138)/(56-82) 103/75 mmHg (11/12 0900) SpO2:  [96 %-100 %] 97 % (11/12 0900) Weight:  [82.8 kg (182 lb 8.7 oz)] 82.8 kg (182 lb 8.7 oz) (11/12 0114) Weight change:  Last BM Date: 04/03/12  Intake/Output from previous day: 11/11 0701 - 11/12 0700 In: 566.7 [I.V.:566.7] Out: -  Total I/O In: 303 [I.V.:303] Out: -    Physical Exam: General: Comfortable, alert, communicative, fully oriented, not short of breath at rest.  HEENT:  No clinical pallor, no jaundice, no conjunctival injection or discharge. Hydration is fair.  NECK:  Supple, JVP not seen, no carotid bruits, no palpable lymphadenopathy, no palpable goiter. CHEST:  Clinically clear to auscultation, no wheezes, no crackles. HEART:  Sounds 1 and 2 heard, normal, regular, no murmurs. ABDOMEN:  Full, soft, non-tender, no palpable organomegaly, no palpable masses, normal bowel sounds. GENITALIA:  Not examined. LOWER EXTREMITIES:  No pitting edema, palpable peripheral pulses. MUSCULOSKELETAL SYSTEM:  Unremarkable.  CENTRAL NERVOUS SYSTEM:  No focal neurologic deficit on gross examination.  Lab Results:  Basename 04/04/12 0450 04/03/12 2156  WBC 9.0 10.2  HGB 13.2 14.8  HCT 39.0 43.7  PLT 236 240    Basename 04/04/12 0450 04/03/12 2156  NA 146* 142  K 3.6 3.5  CL 111 104  CO2 27 28  GLUCOSE 89 91  BUN 10 9  CREATININE 0.79 0.83  CALCIUM 8.6 9.5   Recent Results (from the past 240 hour(s))  MRSA PCR SCREENING     Status: Normal   Collection Time   04/04/12  1:13 AM      Component  Value Range Status Comment   MRSA by PCR NEGATIVE  NEGATIVE Final      Studies/Results: No results found.  Medications: Scheduled Meds:   . [COMPLETED] sodium chloride   Intravenous Once  . nicotine  14 mg Transdermal Daily  . [COMPLETED] sodium bicarbonate  50 mEq Intravenous Once  . [COMPLETED] sodium chloride  1,000 mL Intravenous Once  . sodium chloride  3  mL Intravenous Q12H  . [DISCONTINUED] sodium chloride   Intravenous STAT   Continuous Infusions:   . sodium chloride 100 mL/hr at 04/04/12 1122   PRN Meds:.acetaminophen, acetaminophen, albuterol, ondansetron (ZOFRAN) IV, ondansetron, [DISCONTINUED] ondansetron (ZOFRAN) IV    LOS: 1 day   Maysoon Lozada,CHRISTOPHER  [facility] Pager [phone]. If 8PM-8AM, please contact night-coverage at [email or web address], password [facility] 04/04/2012, 12:16 PM  LOS: 1 day

## 2012-04-04 NOTE — BHH Counselor (Signed)
The patient has been accepted to [facility] by K. Karoline Caldwell NP to the service of Dr Daleen Bo. She is assigned to room 503-01. She will be transported by [facility]. She is  an [facility] resident and will be a [facility] sponsorship. Spoke with A Meyers in admission, she will complete [facility] paperwork and fax it in for the [facility]. Support paperwork completed and faxed to [facility]. Dr Brien Few informed of the acceptance .

## 2012-04-04 NOTE — Discharge Summary (Signed)
Physician Discharge Summary  Bethany Little ZOX:[id number] DOB: 25-Nov-1985 DOA: 04/03/2012  PCP: No primary provider on file.  Admit date: 04/03/2012 Discharge date: 04/04/2012  Time spent: 35 minutes  Recommendations for Outpatient Follow-up:  1. Transferred to [facility].   Discharge Diagnoses:  Principal Problem:  *Amitriptyline overdose Active Problems:  Suicidal intent  Depression   Discharge Condition: Satisfactory.  Diet recommendation: Regular.  Filed Weights   04/04/12 0114  Weight: 82.8 kg (182 lb 8.7 oz)    History of present illness:  25 y.o. female with a past medical history of depression, cervical abnormalities of unknown type. 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 She, according to the fianc who provided the history, stated that this was her second adverse report and this would mean that the patient would lose her job. Patient was very upset, came home and then about 8:30 PM, ingested about 20 tablets of amitriptyline, and apparently tried to cut herself on the left arm. She was found to be somnolent at home, EMS was called and the patient was brought into the hospital. According to the fianc, patient has no previous history of suicidal behavior. He also says that she was diagnosed with some form of cancer recently and is awaiting insurance, so, that "chemotherapy" can be initiated.   Hospital Course:  1. Amitriptyline overdose: Patient ingested about 20 x 25 mg Amitriptyline. Fortunately, she remained asymptomatic, showed no arrhythmias on telemetric monitoring. Repeat EKG on 04/04/12, showed normal Q-TC.  2. Suicidal attempt: Patient has been evaluated by behavioral health, and is currently on suicide precautions/1:1 Sitter. Inpatient treatment at [facility], has been recommended.  3. History of depression: See above discussion. 4. Tobacco abuse: Counseled and managed with  Nicoderm CQ patch.    Procedures:  N/A.  Consultations:  Psychiatry.  Discharge Exam: Filed Vitals:   04/04/12 0800 04/04/12 0900 04/04/12 1200 04/04/12 1427  BP: 102/71 103/75  100/58  Pulse: 85 82  93  Temp: 98.9 F (37.2 C)  98.5 F (36.9 C) 98.2 F (36.8 C)  TempSrc: Oral  Oral Oral  Resp: 14   18  Height:      Weight:      SpO2: 98% 97%  100%    General: Comfortable, alert, communicative, fully oriented, not short of breath at rest.  HEENT: No clinical pallor, no jaundice, no conjunctival injection or discharge. Hydration is fair.  NECK: Supple, JVP not seen, no carotid bruits, no palpable lymphadenopathy, no palpable goiter.  CHEST: Clinically clear to auscultation, no wheezes, no crackles.  HEART: Sounds 1 and 2 heard, normal, regular, no murmurs.  ABDOMEN: Full, soft, non-tender, no palpable organomegaly, no palpable masses, normal bowel sounds.  GENITALIA: Not examined.  LOWER EXTREMITIES: No pitting edema, palpable peripheral pulses.  MUSCULOSKELETAL SYSTEM: Unremarkable.  CENTRAL NERVOUS SYSTEM: No focal neurologic deficit on gross examination.  Discharge Instructions      Discharge Orders    Future Orders Please Complete By Expires   Diet general      Increase activity slowly          Medication List     As of 04/04/2012  4:46 PM    STOP taking these medications         amitriptyline 25 MG tablet   Commonly known as: ELAVIL      cefdinir 300 MG capsule   Commonly known as: OMNICEF  desipramine 25 MG tablet   Commonly known as: NORPRAMIN      diphenhydrAMINE 25 mg capsule   Commonly known as: BENADRYL      HYDROcodone-acetaminophen 5-500 MG per tablet   Commonly known as: VICODIN      promethazine 25 MG tablet   Commonly known as: PHENERGAN      TAKE these medications         levonorgestrel 20 MCG/24HR IUD   Commonly known as: MIRENA   1 each by Intrauterine route once.      nicotine 14 mg/24hr patch   Commonly known as:  NICODERM CQ - dosed in mg/24 hours   Place 1 patch onto the skin daily.         Follow-up Information    Please follow up. (To be determined)           The results of significant diagnostics from this hospitalization (including imaging, microbiology, ancillary and laboratory) are listed below for reference.    Significant Diagnostic Studies: No results found.  Microbiology: Recent Results (from the past 240 hour(s))  MRSA PCR SCREENING     Status: Normal   Collection Time   04/04/12  1:13 AM      Component Value Range Status Comment   MRSA by PCR NEGATIVE  NEGATIVE Final      Labs: Basic Metabolic Panel:  Lab 04/04/12 4098 04/03/12 2156  NA 146* 142  K 3.6 3.5  CL 111 104  CO2 27 28  GLUCOSE 89 91  BUN 10 9  CREATININE 0.79 0.83  CALCIUM 8.6 9.5  MG -- --  PHOS -- --   Liver Function Tests:  Lab 04/04/12 0450 04/03/12 2156  AST 12 14  ALT 13 17  ALKPHOS 38* 48  BILITOT 0.4 0.3  PROT 5.9* 7.3  ALBUMIN 3.2* 4.1   No results found for this basename: LIPASE:5,AMYLASE:5 in the last 168 hours No results found for this basename: AMMONIA:5 in the last 168 hours CBC:  Lab 04/04/12 0450 04/03/12 2156  WBC 9.0 10.2  NEUTROABS -- 6.8  HGB 13.2 14.8  HCT 39.0 43.7  MCV 94.2 93.4  PLT 236 240   Cardiac Enzymes:  Lab 04/03/12 2245 04/03/12 2156  CKTOTAL 82 --  CKMB -- --  CKMBINDEX -- --  TROPONINI -- <0.30   BNP: BNP (last 3 results) No results found for this basename: PROBNP:3 in the last 8760 hours CBG: No results found for this basename: GLUCAP:5 in the last 168 hours     Signed:  Maurie Musco,CHRISTOPHER  [facility] 04/04/2012, 4:46 PM

## 2012-04-04 NOTE — Progress Notes (Signed)
Report given to Armanda Magic, RN. Patient ready for transfer to room 212 with suicide sitter.  ACT team to return to do paperwork for inpatient rehabilitation.  Patient resting quietly at present with no acute distress noted.

## 2012-04-05 LAB — TSH: TSH: 0.386 u[IU]/mL (ref 0.350–4.500)

## 2012-04-05 MED ORDER — ONDANSETRON 4 MG PO TBDP
4.0000 mg | ORAL_TABLET | Freq: Four times a day (QID) | ORAL | Status: DC | PRN
  Administered 2012-04-05: 4 mg via ORAL

## 2012-04-05 MED ORDER — FAMOTIDINE 20 MG PO TABS
20.0000 mg | ORAL_TABLET | Freq: Two times a day (BID) | ORAL | Status: DC
  Administered 2012-04-05: 20 mg via ORAL
  Filled 2012-04-05 (×7): qty 1

## 2012-04-05 MED ORDER — ONDANSETRON 4 MG PO TBDP
4.0000 mg | ORAL_TABLET | Freq: Once | ORAL | Status: AC
  Administered 2012-04-05: 4 mg via ORAL
  Filled 2012-04-05: qty 1

## 2012-04-05 MED ORDER — SERTRALINE HCL 25 MG PO TABS
25.0000 mg | ORAL_TABLET | Freq: Every day | ORAL | Status: DC
  Administered 2012-04-05 – 2012-04-07 (×3): 25 mg via ORAL
  Filled 2012-04-05 (×2): qty 1
  Filled 2012-04-05: qty 14
  Filled 2012-04-05 (×4): qty 1
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
## 2012-04-05 NOTE — Tx Team (Signed)
Interdisciplinary Treatment Plan Update (Adult)  Date:  04/05/2012  Time Reviewed:  9:37 AM   Progress in Treatment: Attending groups:   Yes   Participating in groups:  Yes Taking medication as prescribed:  Yes Tolerating medication:  Yes Family/Significant othe contact made: Contact made with family to be made Patient understands diagnosis:  Yes Discussing patient identified problems/goals with staff: Yes Medical problems stabilized or resolved: Yes Denies suicidal/homicidal ideation: Yes Issues/concerns per patient self-inventory:  Other:  New problem(s) identified:  Reason for Continuation of Hospitalization: Anxiety Depression Medication stabilization  Interventions implemented related to continuation of hospitalization:  Medication Management; safety checks q 15 mins  Additional comments:  Estimated length of stay:  2-3 days  Discharge Plan:  Home with outpatient follow up in [facility] goal(s):  Review of initial/current patient goals per problem list:    1.  Goal(s): Eliminate SI/other thoughts of self harm   Met:  Yes  Target date: d/c  As evidenced by: Patient no longer endorsing SI/HI or other thoughts of self harm.    2.  Goal (s):Reduce depression/anxiety (rated at five six)  Met: No  Target date: d/c  As evidenced by: Patient currently rating symptoms at four or below    3.  Goal(s):.stabilize on meds   Met:  No  Target date: d/c  As evidenced by: Patient reports being stabilized on medications - less symptomatic    4.  Goal(s): Refer for outpatient follow up   Met:  No  Target date: d/c  As evidenced by: Follow up appointment scheduled    Attendees: Patient:  Bethany Little 04/05/2012 9:37 AM  Physican:  Patrick North, MD 04/05/2012 9:37 AM  Nursing:  Neill Loft, RN 04/05/2012 9:37 AM   Nursing:   Quintella Reichert, RN 04/05/2012 9:37 AM   Clinical Social Worker:  Juline Patch, LCSW 04/05/2012 9:37 AM     Other: Norval Gable, FNP 04/05/2012 9:37 AM   Other: Teddy Spike, MSW Intern     04/05/2012 04/05/2012 Other:        04/05/2012 9:37 AM

## 2012-04-05 NOTE — Progress Notes (Signed)
[facility] Group Notes:  (Counselor/Nursing/MHT/Case Management/Adjunct)  04/05/2012 8:10PM  Type of Therapy:  Group Therapy  Participation Level:  Active  Participation Quality:  Attentive, Sharing and Supportive  Affect:  Flat  Cognitive:  Alert and Oriented  Insight:  Good  Engagement in Group:  Good  Engagement in Therapy:  Good  Modes of Intervention:  Clarification, Problem-solving, Socialization and Support  Summary of Progress/Problems: Pt reported that she realized that she needs to change her environment. Pt reported that a coping strategy that she uses is to sit in her room and have some alone time. Pt stated that she is looking forward to going home and being with her son and fiance. Pt reported that her two main supports are her grandparents and her fiance. Pt reported that coping strategies after her discharge is for outpatient therapy, regular therapy sessions, and to concentrate more on herself and learn to understand her cancer.   Jennifier Smitherman, Randal Buba 04/05/2012, 9:23 PM

## 2012-04-05 NOTE — Progress Notes (Signed)
[facility] Group Notes:  (Counselor/Nursing/MHT/Case Management/Adjunct)  04/05/2012 5:05 PM  Type of Therapy:  Psychoeducational Skills  Participation Level:  Active  Participation Quality:  Appropriate, Attentive and Sharing  Affect:  Appropriate  Cognitive:  Alert, Appropriate and Oriented  Insight:  Good  Engagement in Group:  Good  Engagement in Therapy:  n/a  Modes of Intervention:  Activity, Education, Problem-solving, Socialization and Support  Summary of Progress/Problems: [name] attended a psychoeducational group on labels. 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 [name] was active while group discussed what labels are, how they change the way we think about and perceive the world, and listed positive and negative labels they have used or been called. [name] was given a homework assignment to list 10 words she has been labeled and to find the reality of that situation/label.     Bethany Little 04/05/2012, 5:05 PM

## 2012-04-05 NOTE — Progress Notes (Signed)
  Aftercare Planning Group: 04/05/2012 8:45 AM  Kathryn did not attend group this morning.  Will work to [employer] in group later this afternoon and being developing a aftercare plan.    [facility] Group Notes:  (Counselor/Nursing/MHT/Case Management/Adjunct)     Emotional Regulation 1:15 - 2:30 PM            04/05/2012 1:15 PM  Type of Therapy:  Group Therapy  Participation Level:  Minimal  Participation Quality:  Appropriate and Attentive  Affect:  Appropriate  Cognitive:  Alert and Appropriate  Insight:  Limited  Engagement in Group:  Limited  Engagement in Therapy:  Limited  Modes of Intervention:  Education, Problem-solving, Support and Exploration  Summary of Progress/Problems:  Tiffney stated she has difficulty with her emotions.  She stated it feels like for every step forward, someone pulls her twenty step backwards.  She advised her son is the only thing that keeps her going.   Hannah Nail, LCSW 04/05/2012 10:29 AM

## 2012-04-05 NOTE — Progress Notes (Signed)
Psychoeducational Group Note  Date:  04/05/2012 Time:  1100  Group Topic/Focus:  Personal Choices and Values:   The focus of this group is to help patients assess and explore the importance of values in their lives, how their values affect their decisions, how they express their values and what opposes their expression.  Participation Level: Did Not Attend  Participation Quality:  Not Applicable  Affect:  Not Applicable  Cognitive:  Not Applicable  Insight:  Not Applicable  Engagement in Group: Not Applicable  Additional Comments:  Patient did not attend group.  Karleen Hampshire Brittini 04/05/2012, 7:06 PM
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
## 2012-04-05 NOTE — Progress Notes (Signed)
D: Patient resting in bed with eyes closed.  Respirations even and unlabored.  Patient appears to be in no apparent distress. A: Staff to monitor Q 15 mins for safety.   R:Patient remains safe on the unit.  

## 2012-04-05 NOTE — Progress Notes (Signed)
D: Patient in her room sitting up in bed on approach.  Patient states she had been depressed today but it got better after her boyfriend came to visit her.  Patient states she knew that something was off and was not feeling well prior to being admitted.  Patient states she did not remember taking pills with intent of harming herself.  Patient states she has a 28 year old son who is staying with his father. Patient state she is sad and feeling depressed.  Patient denies SI/HI and denies AVH.  A: Staff to monitor Q 15 mins for safety.  Encouragement and support offered.  Scheduled medications administered per orders. Zofran administered prn for nausea. R: Patient remains safe on the unit.  Patient attended wrap-up group tonight.  Patient calm,cooperative and taking administered medications. Patient states she felt relief from Zofran.

## 2012-04-05 NOTE — Progress Notes (Signed)
D: Patient pleasant and cooperative with staff. Patient c/o nausea. She reported on self inventory sheet that her energy level is low and ability to pay attention is good. Patient rated depression "3" and feelings of hopelessness "4".    A: Support and encouragement provided to patient. Administered scheduled medications per MD orders. Maintain 15 minute checks for safety.   R: Patient receptive. Patient reported relief of nausea. Denies SI/HI/AVH. Patient remains safe.

## 2012-04-06 ENCOUNTER — Encounter (HOSPITAL_COMMUNITY): Payer: Self-pay | Admitting: Family Medicine

## 2012-04-06 DIAGNOSIS — T43011A Poisoning by tricyclic antidepressants, accidental (unintentional), initial encounter: Secondary | ICD-10-CM

## 2012-04-06 DIAGNOSIS — R030 Elevated blood-pressure reading, without diagnosis of hypertension: Secondary | ICD-10-CM

## 2012-04-06 DIAGNOSIS — T43591A Poisoning by other antipsychotics and neuroleptics, accidental (unintentional), initial encounter: Secondary | ICD-10-CM

## 2012-04-06 NOTE — Consult Note (Signed)
[facility] Medical Consultation  Prerana Xxxhutchins UJW:[id number] DOB: 1985/12/18 DOA: 04/04/2012 PCP: No primary provider on file. Dr. Audley Hose at [facility]  Requesting physician: Henrietta Dine, MD Date of consultation: 04/06/2012 Reason for consultation: hand swelling  Impression/Recommendations 1. Adverse drug reaction to Elavil--constellation of signs and symptoms consistent with reaction to Elavil (edema, thirst, hypertension, dizziness, blurred vision, palpitations). Main complaint of edema already improved. Except spontaneous improvement to continue. 2. Reported upper/lower extremity edema--no noted upper extremity/hand edema. Trace bilateral LE edema. Likely secondary to ADR and is spontaneously resolving. No further suggestions. 3. Elevated blood pressure--no history of HTN, likely secondary to stress, possibly Elavil. Recommend outpatient follow-up. No medications suggested at this time.  Patient knows to follow-up with gynecologist. No further recommendations. I will sign off. Please call with questions. Thank you for consultation.  Brendia Sacks, MD [facility] Team 2 Pager [phone]  If 8PM-8AM, please contact night-coverage [email or web address] Password [email or web address]  Chief Complaint: hand swelling  HPI:  26 year old woman initially admitted for Elavil overdose and subsequently transferred to [facility] for inpatient treatment. Noted edema of hands last night and had difficulty manipulating and removing ring this morning. Also noted LE edema bilaterally, elevated blood pressure, some dizziness, blurred vision at times, thirst. New medications--Zoloft, trazodone, Pepcid.  I was asked to comment on hand swelling and elevated blood pressure. Patient denies history of HTN. She does report a cervix problem that is actively followed by her GYN and has had LEEP in the past.  Patient reports hand and leg edema better this evening.  Review of Systems:  Negative for fever, sore throat, rash, new  muscle aches, chest pain, SOB, bleeding, n/v/  Positive for chronic dysuria (interstitial cystitis).  Past Medical History  Diagnosis Date  . Cervical abnormality   . Interstitial cystitis   . Depression   . Leukemia     Remission for 23 yrs   Past Surgical History  Procedure Date  . Cesarean section   . Leep    Social History:  reports that she has been smoking Cigarettes.  She has a 5 pack-year smoking history. She does not have any smokeless tobacco history on file. She reports that she drinks alcohol. She reports that she uses illicit drugs (Marijuana).  Allergies  Allergen Reactions  . Erythromycin Hives    Childhood allergy  . Pertussis Vaccines Hives    Childhood allergy  . Tetracyclines & Related Hives    Childhood allergy  . Latex Itching and Rash   Family History  Problem Relation Age of Onset  . Depression Mother     Prior to Admission medications   Medication Sig Start Date End Date Taking? Authorizing Provider  amitriptyline (ELAVIL) 25 MG tablet Take 25 mg by mouth at bedtime.   Yes Historical Provider, MD  cefdinir (OMNICEF) 300 MG capsule Take 600 mg by mouth daily.   Yes Historical Provider, MD  desipramine (NORPRAMIN) 25 MG tablet Take 25 mg by mouth daily as needed. For muscle spasms   Yes Historical Provider, MD  HYDROcodone-acetaminophen (VICODIN) 5-500 MG per tablet Take 1 tablet by mouth every 6 (six) hours as needed. For pain   Yes Historical Provider, MD  promethazine (PHENERGAN) 25 MG tablet Take 25 mg by mouth every 6 (six) hours as needed. For nausea   Yes Historical Provider, MD  levonorgestrel (MIRENA) 20 MCG/24HR IUD 1 each by Intrauterine route once.    Historical Provider, MD   Physical Exam: Filed Vitals:   04/05/12 6578 04/05/12 0745  04/06/12 0600 04/06/12 0601  BP: 124/87 132/86 136/92 141/92  Pulse: 83 86 83 86  Temp: 98.6 F (37 C)  98 F (36.7 C)   TempSrc: Oral     Resp: 15  16     General:  Appears calm and  comfortable Eyes: PERRL, normal lids, irises ENT: grossly normal hearing, lips & tongue Neck: no LAD, masses or thyromegaly Cardiovascular: RRR, no m/r/g. Trace bilateral LE edema. No appreciable hand edema bilaterally. Ring left ring finger is loose and easily manipulated. Bony landmarks both hands easily visible. Respiratory: CTA bilaterally, no w/r/r. Normal respiratory effort. Abdomen: soft, ntnd Skin: no rash or induration seen on limited exam Musculoskeletal: grossly normal tone BUE/BLE Psychiatric: grossly normal mood and affect, speech fluent and appropriate  Labs on Admission:  Basic Metabolic Panel:  Lab 04/04/12 1610 04/03/12 2156  NA 146* 142  K 3.6 3.5  CL 111 104  CO2 27 28  GLUCOSE 89 91  BUN 10 9  CREATININE 0.79 0.83  CALCIUM 8.6 9.5  MG -- --  PHOS -- --   Liver Function Tests:  Lab 04/04/12 0450 04/03/12 2156  AST 12 14  ALT 13 17  ALKPHOS 38* 48  BILITOT 0.4 0.3  PROT 5.9* 7.3  ALBUMIN 3.2* 4.1   CBC:  Lab 04/04/12 0450 04/03/12 2156  WBC 9.0 10.2  NEUTROABS -- 6.8  HGB 13.2 14.8  HCT 39.0 43.7  MCV 94.2 93.4  PLT 236 240   Cardiac Enzymes:  Lab 04/03/12 2245 04/03/12 2156  CKTOTAL 82 --  CKMB -- --  CKMBINDEX -- --  TROPONINI -- <0.30    Principal Problem:  *Depression Active Problems:  Amitriptyline overdose    Time spent: 35 minutes  Brendia Sacks, MD [facility] Team 2 Pager [phone]  If 8PM-8AM, please contact night-coverage [email or web address] Password [facility] 04/06/2012, 6:57 PM

## 2012-04-06 NOTE — Progress Notes (Signed)
Psychoeducational Group Note  Date:  04/06/2012 Time:  10:00 AM  Group Topic/Focus:  Therapeutic Activity - Question Ball  Participation Level:  Active  Participation Quality:  Appropriate  Affect:  Appropriate  Cognitive:  Alert and Oriented  Insight:  Good  Engagement in Group:  Good  Additional Comments:    Melanee Spry 04/06/2012, 6:29 PM
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
## 2012-04-06 NOTE — Progress Notes (Signed)
Psychoeducational Group Note  Date:  04/06/2012 Time:  1100  Group Topic/Focus:  Rediscovering Joy:   The focus of this group is to explore various ways to relieve stress in a positive manner.  Participation Level:  Active  Participation Quality:  Appropriate, Attentive and Sharing  Affect:  Appropriate  Cognitive:  Appropriate  Insight:  Good  Engagement in Group:  Good  Additional Comments:  Patient participated in group of rediscovering joy. Patient shared and stated ways humor and laughter affected the mind and body. Patient then stated others ways to have joy apart of life as a coping skill. Patient was asked to give one thing that brings joy to patient when it is related to five senses. Patient understood that using the five senses as tool was a way of being pro-active and coping skills.   Karleen Hampshire Brittini 04/06/2012, 4:31 PM

## 2012-04-06 NOTE — Progress Notes (Signed)
Aftercare Planning Group:  04/06/2012 8:30 AM   Patient rates all symptoms at zero. She is not endorsing SI or HI at this time.  Patient communicates that she would like a letter for work. She is interested in follow up with [facility] in [place].    [facility] Group Notes:  (Counselor/Nursing/MHT/Case Management/Adjunct)  04/06/2012 10:39 AM  Type of Therapy:  Group Therapy  Participation Level:  Minimal  Participation Quality:  Appropriate and Attentive  Affect:  Appropriate  Cognitive:  Alert and Appropriate  Insight:  Good  Engagement in Group:  Limited  Engagement in Therapy:  Limited  Modes of Intervention:  Clarification, Problem-solving and Support  Summary of Progress/Problems:  Bethany Little listened attentively to speaker from [employer].  She shared that her strength is that she is caring.  She stated group would be surprised to learn she is a three time cancer survivor.  She noted she has been battling cancer for 28 years.   Bethany Little, Bethany Little 04/06/2012, 10:39 AM

## 2012-04-06 NOTE — Progress Notes (Signed)
Patient states over the past few days her hands have been swelling.  No pitting edema noted however patient cannot get her ring off her ring finger and she was able to last night.

## 2012-04-06 NOTE — Progress Notes (Signed)
Nutrition Brief Note  Patient identified on the Malnutrition Screening Tool (MST) Report  Current wt: 182 lbs, wt gain. Wt Readings from Last 10 Encounters:  04/04/12 182 lb 8.7 oz (82.8 kg)  01/01/12 172 lb (78.019 kg)  10/07/11 170 lb (77.111 kg)    Current diet order is Regular and pt is consuming approximately 100% of meals at this time. Labs and medications reviewed.   Pt with wt gain.  Notes wt fluctuates and denies concern for nutrition or wt status.  No nutrition interventions warranted at this time. If nutrition issues arise, please consult RD.   Loyce Dys, MS RD LDN Clinical Inpatient Dietitian Pager: [phone] Weekend/After hours pager: [phone]

## 2012-04-06 NOTE — Progress Notes (Signed)
[facility] MD Progress Note  04/06/2012 11:16 AM Bethany Little  MRN:  [id number]  S: Patient reports her mood is improved. Having swelling all over body. BP is elevated, having increased thirst. Poor appetite.  Diagnosis:   Axis I: Depressive Disorder NOS Axis II: No diagnosis Axis III:  Past Medical History  Diagnosis Date  . Cervical abnormality   . Interstitial cystitis   . Depression   . Leukemia     Remission for 23 yrs   Axis IV: occupational problems and other psychosocial or environmental problems Axis V: 51-60 moderate symptoms  ADL's:  Intact  Sleep: Fair  Appetite:  Poor  Mental Status Examination/Evaluation: Objective:  Appearance: Casual, looks puffy in the face  Eye Contact::  Good  Speech:  Clear and Coherent  Volume:  Normal  Mood:  Anxious and Depressed  Affect:  Full Range  Thought Process:  Coherent  Orientation:  Full  Thought Content:  WDL  Suicidal Thoughts:  No  Homicidal Thoughts:  No  Memory:  Immediate;   Fair Recent;   Fair Remote;   Fair  Judgement:  Fair  Insight:  Fair  Psychomotor Activity:  Normal  Concentration:  Fair  Recall:  Fair  Akathisia:  No  Handed:  Right  AIMS (if indicated):     Assets:  Communication Skills Desire for Improvement Housing  Sleep:  Number of Hours: 6.75    Vital Signs:Blood pressure 141/92, pulse 86, temperature 98 F (36.7 C), temperature source Oral, resp. rate 16, last menstrual period 03/20/2012. Current Medications: Current Facility-Administered Medications  Medication Dose Route Frequency Provider Last Rate Last Dose  . acetaminophen (TYLENOL) tablet 650 mg  650 mg Oral Q6H PRN Kerry Hough, PA   650 mg at 04/06/12 4098  . alum & mag hydroxide-simeth (MAALOX/MYLANTA) 200-200-20 MG/5ML suspension 30 mL  30 mL Oral Q4H PRN Kerry Hough, PA      . famotidine (PEPCID) tablet 20 mg  20 mg Oral BID Kerry Hough, PA   20 mg at 04/05/12 2118  . magnesium hydroxide (MILK OF MAGNESIA)  suspension 30 mL  30 mL Oral Daily PRN Kerry Hough, PA      . nicotine (NICODERM CQ - dosed in mg/24 hours) patch 21 mg  21 mg Transdermal Q0600 Kerry Hough, PA   21 mg at 04/06/12 0643  . [COMPLETED] ondansetron (ZOFRAN-ODT) disintegrating tablet 4 mg  4 mg Oral Once Maryland Luppino, MD   4 mg at 04/05/12 1326  . ondansetron (ZOFRAN-ODT) disintegrating tablet 4 mg  4 mg Oral Q6H PRN Kerry Hough, PA   4 mg at 04/05/12 2057  . sertraline (ZOLOFT) tablet 25 mg  25 mg Oral Daily Lura Falor, MD   25 mg at 04/06/12 0933  . traZODone (DESYREL) tablet 50 mg  50 mg Oral QHS,MR X 1 Kerry Hough, PA   50 mg at 04/05/12 2229    Lab Results:  Results for orders placed during the hospital encounter of 04/04/12 (from the past 48 hour(s))  TSH     Status: Normal   Collection Time   04/05/12  6:21 AM      Component Value Range Comment   TSH 0.386  0.350 - 4.500 uIU/mL     Physical Findings: AIMS: Facial and Oral Movements Muscles of Facial Expression: None, normal Lips and Perioral Area: None, normal Jaw: None, normal Tongue: None, normal,Extremity Movements Upper (arms, wrists, hands, fingers): None, normal Lower (legs, knees, ankles,  toes): None, normal, Trunk Movements Neck, shoulders, hips: None, normal, Overall Severity Severity of abnormal movements (highest score from questions above): None, normal Incapacitation due to abnormal movements: None, normal Patient's awareness of abnormal movements (rate only patient's report): No Awareness, Dental Status Current problems with teeth and/or dentures?: No Does patient usually wear dentures?: No  CIWA:    COWS:     Treatment Plan Summary: Daily contact with patient to assess and evaluate symptoms and progress in treatment Medication management  Plan: Obtain hospitalist consult to evaluate swelling and high BP. Continue current plan of care. Plan for discharge tomorrow. Bethany Little 04/06/2012, 11:16 AM

## 2012-04-06 NOTE — Progress Notes (Signed)
D: Patient appropriate and cooperative with staff and peers. Sad and tearful at times. Patient complained of headache 8/10. Patient attending groups throughout the day.   A: Support and encouragement provided to patient. Scheduled medications administered per MD orders. PRN Tylenol given for pain. Maintain 15 minute checks for safety.  R: Patient receptive. Denies SI/HI/AVH. Patient remains safe.
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
## 2012-04-07 DIAGNOSIS — F329 Major depressive disorder, single episode, unspecified: Principal | ICD-10-CM

## 2012-04-07 MED ORDER — SERTRALINE HCL 25 MG PO TABS: 25.0000 mg | ORAL_TABLET | Freq: Every day | ORAL | Status: DC

## 2012-04-07 MED ORDER — TRAZODONE HCL 50 MG PO TABS: 50.0000 mg | ORAL_TABLET | Freq: Every evening | ORAL | Status: DC | PRN

## 2012-04-07 MED ORDER — LEVONORGESTREL 20 MCG/24HR IU IUD: 1.0000 | INTRAUTERINE_SYSTEM | Freq: Once | INTRAUTERINE | Status: DC

## 2012-04-07 NOTE — Progress Notes (Signed)
Patient attended 11 am Psychoeducational group.  Group consists of therapeutic question ball along with long and short term goals 

## 2012-04-07 NOTE — [facility] Suicide Risk Assessment (Signed)
Suicide Risk Assessment  Discharge Assessment     Demographic Factors:  Caucasian  Mental Status Per Nursing Assessment::   On Admission:  NA  Current Mental Status by Physician: In full contact with reality. There are no suicidal ideas, plans or intent. Her mood is euthymic, her affect is bright, broad. Expresses readiness to be discharge and follow up on outpatient basis  Loss Factors: Decline in physical health  Historical Factors: NA  Risk Reduction Factors:   Sense of responsibility to family, Employed, Living with another person, especially a relative, Positive social support and Positive coping skills or problem solving skills  Continued Clinical Symptoms: Depression improved.    Cognitive Features That Contribute To Risk: No evidence  Suicide Risk:  Minimal: No identifiable suicidal ideation.  Patients presenting with no risk factors but with morbid ruminations; may be classified as minimal risk based on the severity of the depressive symptoms  Discharge Diagnoses:   AXIS I:  Major Depression, single episode AXIS II:  Deferred AXIS III:   Past Medical History  Diagnosis Date  . Cervical abnormality   . Interstitial cystitis   . Depression   . Leukemia     Remission for 23 yrs   AXIS IV:  occupational problems AXIS V:  61-70 mild symptoms  Plan Of Care/Follow-up recommendations:  Activity:  As tolerated Diet:  Regular  Is patient on multiple antipsychotic therapies at discharge:  No   Has Patient had three or more failed trials of antipsychotic monotherapy by history:  No  Recommended Plan for Multiple Antipsychotic Therapies: N/A   Dajana Gehrig A 04/07/2012, 12:20 PM

## 2012-04-07 NOTE — Progress Notes (Signed)
D: Pt in bed resting with eyes closed. Respirations even and unlabored. Pt appears to be in no signs of distress at this time. A: Q15min checks remains for this pt. R: Pt remains safe at this time.   
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
## 2012-04-07 NOTE — Progress Notes (Signed)
[facility] Adult Inpatient Family/Significant Other Suicide Prevention Education  Suicide Prevention Education:  Education Completed; Bethany Little (Father) [phone] has been identified by the patient as the family member/significant other with whom the patient will be residing, and identified as the person(s) who will aid the patient in the event of a mental health crisis (suicidal ideations/suicide attempt).  With written consent from the patient, the family member/significant other has been provided the following suicide prevention education, prior to the and/or following the discharge of the patient.  The suicide prevention education provided includes the following:  Suicide risk factors  Suicide prevention and interventions  National Suicide Hotline telephone number  [facility] assessment telephone number  [facility] Emergency Assistance 911  [facility] and/or Residential Mobile Crisis Unit telephone number  Request made of family/significant other to:  Remove weapons (e.g., guns, rifles, knives), all items previously/currently identified as safety concern.    Remove drugs/medications (over-the-counter, prescriptions, illicit drugs), all items previously/currently identified as a safety concern.  The family member/significant other verbalizes understanding of the suicide prevention education information provided.  The family member/significant other agrees to remove the items of safety concern listed above.  Bethany Little, Bethany Little 04/07/2012, 1:51 PM

## 2012-04-07 NOTE — Progress Notes (Signed)
[facility] Case Management Discharge Plan:  Will you be returning to the same living situation after discharge: Yes,  Patient returning home with family At discharge, do you have transportation home?:Yes,  Patient has arranged transportation home Do you have the ability to pay for your medications:Yes,  Patient has insurance and able to make co-payment  Interagency Information:     Release of information consent forms completed and in the chart;  Patient's signature needed at discharge.  Patient to Follow up at:  Follow-up Information    Follow up with Florencia Reasons [facility]. On 04/11/2012. (You are scheduled with Florencia Reasons on Tuesday, April 11, 2012 at 9:45 AM)    Contact information:   [address] [place], [place]  [address]  [phone]        Follow up with Dr. Dan Humphreys - [facility] [employer]. On 05/05/2012. (You are scheduled to see Dr. Dan Humphreys on Friday, May 05, 2012 15 8:45 am)    Contact information:   [address] [place], [place]  [address]  [phone]         Patient denies SI/HI:   Yes,  Patient denies SI/HI and other thoughts of self harm    Safety Planning and Suicide Prevention discussed:  Yes,  Reviewed during after care group  Barrier to discharge identified:Yes,  Limited supports  Summary and Recommendations: .Patient encouraged to be compliant with medications and to follow up with all outpatient recommendations.    Wynn Banker 04/07/2012, 3:41 PM

## 2012-04-07 NOTE — Progress Notes (Signed)
D/C instructions/meds/follow-up reviewed, pt verbalized understanding, pt's belongings returned, samples given. 

## 2012-04-07 NOTE — Discharge Summary (Signed)
Physician Discharge Summary Note  Patient:  Bethany Little is an 26 y.o., female MRN:  [id number] DOB:  [birth year] Patient phone:  [phone] (home)  Patient address:   [address] [place] [address],   Date of Admission:  04/04/2012 Date of Discharge: 05/07/2012  Reason for Admission: Amitriptyline overdose  Discharge Diagnoses: Principal Problem:  *Depression Active Problems:  Amitriptyline overdose  Elevated blood pressure   Axis Diagnosis:      Level of Care:  OP  Hospital Course:  Bethany Little was admitted after being stabilized in the ICU after an overdose on Amitriptyline.  She endorsed symptoms of depression, anxiety and feelings of being overwhelmed. She was evaluated and treated with Zoloft 25mg  po qd for her anxiety and depression, and trazodone for sleep.      Bethany Little was seen daily by clinical providers to evaluate her response to treatment.  Her improvement was documented in a daily self inventory assessing her mental and emotional status. She was encouraged to participate in unit and group programming with our Case managers and nursing staff.      Her medical problems were addressed and treated appropriately. On the day of discharge she was in much improved condition than upon her admission and requesting discharge. She was evaluated by the treatment team and all felt she was stable to return home. Consults:  None  Significant Diagnostic Studies:  None  Discharge Vitals:   Blood pressure 109/76, pulse 99, temperature 98.1 F (36.7 C), temperature source Oral, resp. rate 20, last menstrual period 03/20/2012. Lab Results:   Results for orders placed during the hospital encounter of 04/04/12 (from the past 72 hour(s))  TSH     Status: Normal   Collection Time   04/05/12  6:21 AM      Component Value Range Comment   TSH 0.386  0.350 - 4.500 uIU/mL     Physical Findings: AIMS: Facial and Oral Movements Muscles of Facial Expression: None, normal Lips  and Perioral Area: None, normal Jaw: None, normal Tongue: None, normal,Extremity Movements Upper (arms, wrists, hands, fingers): None, normal Lower (legs, knees, ankles, toes): None, normal, Trunk Movements Neck, shoulders, hips: None, normal, Overall Severity Severity of abnormal movements (highest score from questions above): None, normal Incapacitation due to abnormal movements: None, normal Patient's awareness of abnormal movements (rate only patient's report): No Awareness, Dental Status Current problems with teeth and/or dentures?: No Does patient usually wear dentures?: No  CIWA:    COWS:     Mental Status Exam: See Mental Status Examination and Suicide Risk Assessment completed by Attending Physician prior to discharge.  Discharge destination:  Home  Is patient on multiple antipsychotic therapies at discharge:  No   Has Patient had three or more failed trials of antipsychotic monotherapy by history:  No  Recommended Plan for Multiple Antipsychotic Therapies: n/a  Discharge Orders    Future Orders Please Complete By Expires   Diet - low sodium heart healthy      Increase activity slowly      Discharge instructions      Comments:   Take all of your medications as prescribed.  Be sure to keep ALL follow up appointments as scheduled. This is to ensure getting your refills on time to avoid any interruption in your medication.  If you find that you can not keep your appointment, call the clinic and reschedule. Be sure to tell the nurse if you will need a refill before your appointment.  Medication List     As of 04/07/2012 11:10 AM    STOP taking these medications         amitriptyline 25 MG tablet   Commonly known as: ELAVIL      cefdinir 300 MG capsule   Commonly known as: OMNICEF      desipramine 25 MG tablet   Commonly known as: NORPRAMIN      HYDROcodone-acetaminophen 5-500 MG per tablet   Commonly known as: VICODIN      promethazine 25 MG tablet    Commonly known as: PHENERGAN      TAKE these medications      Indication    levonorgestrel 20 MCG/24HR IUD   Commonly known as: MIRENA   1 each by Intrauterine route once.     Contraception    sertraline 25 MG tablet   Commonly known as: ZOLOFT   Take 1 tablet (25 mg total) by mouth daily.    Indication: Anxiety Disorder      traZODone 50 MG tablet   Commonly known as: DESYREL   Take 1 tablet (50 mg total) by mouth at bedtime and may repeat dose one time if needed.    Indication: Trouble Sleeping           Follow-up Information    Follow up with Florencia Reasons [facility]. On 04/11/2012. (You are scheduled with Florencia Reasons on Tuesday, April 11, 2012 at 9:45 AM)    Contact information:   [address] [place], [place]  [address]  [phone]        Follow up with Dr. Dan Humphreys - [facility] [place]. On 05/05/2012. (You are scheduled to see Dr. Dan Humphreys on Friday, May 05, 2012 15 8:45 am)    Contact information:   [address] [place], [place]  [address]  [phone]         Follow-up recommendations:  As noted above  Comments:    Signed: Lloyd Huger T. Aleera Gilcrease PAC 04/07/2012, 11:10 AM

## 2012-04-11 ENCOUNTER — Emergency Department (HOSPITAL_COMMUNITY): Payer: Managed Care, Other (non HMO)

## 2012-04-11 ENCOUNTER — Ambulatory Visit (HOSPITAL_COMMUNITY): Payer: Self-pay | Admitting: Psychiatry

## 2012-04-11 ENCOUNTER — Encounter (HOSPITAL_COMMUNITY): Payer: Self-pay

## 2012-04-11 ENCOUNTER — Emergency Department (HOSPITAL_COMMUNITY)
Admission: EM | Admit: 2012-04-11 | Discharge: 2012-04-11 | Disposition: A | Discharge status: Home / Self Care | Payer: Managed Care, Other (non HMO) | Attending: Emergency Medicine | Admitting: Emergency Medicine

## 2012-04-11 DIAGNOSIS — F3289 Other specified depressive episodes: Secondary | ICD-10-CM | POA: Insufficient documentation

## 2012-04-11 DIAGNOSIS — Z8742 Personal history of other diseases of the female genital tract: Secondary | ICD-10-CM | POA: Insufficient documentation

## 2012-04-11 DIAGNOSIS — Z79899 Other long term (current) drug therapy: Secondary | ICD-10-CM | POA: Insufficient documentation

## 2012-04-11 DIAGNOSIS — R109 Unspecified abdominal pain: Secondary | ICD-10-CM | POA: Insufficient documentation

## 2012-04-11 DIAGNOSIS — F329 Major depressive disorder, single episode, unspecified: Secondary | ICD-10-CM | POA: Insufficient documentation

## 2012-04-11 DIAGNOSIS — T50905A Adverse effect of unspecified drugs, medicaments and biological substances, initial encounter: Secondary | ICD-10-CM

## 2012-04-11 DIAGNOSIS — F411 Generalized anxiety disorder: Secondary | ICD-10-CM | POA: Insufficient documentation

## 2012-04-11 DIAGNOSIS — Z856 Personal history of leukemia: Secondary | ICD-10-CM | POA: Insufficient documentation

## 2012-04-11 DIAGNOSIS — T43205A Adverse effect of unspecified antidepressants, initial encounter: Secondary | ICD-10-CM | POA: Insufficient documentation

## 2012-04-11 DIAGNOSIS — R0602 Shortness of breath: Secondary | ICD-10-CM | POA: Insufficient documentation

## 2012-04-11 DIAGNOSIS — F17200 Nicotine dependence, unspecified, uncomplicated: Secondary | ICD-10-CM | POA: Insufficient documentation

## 2012-04-11 DIAGNOSIS — Z8739 Personal history of other diseases of the musculoskeletal system and connective tissue: Secondary | ICD-10-CM | POA: Insufficient documentation

## 2012-04-11 NOTE — ED Provider Notes (Signed)
History     CSN: [id number]  Arrival date & time 04/11/12  1118   First MD Initiated Contact with Patient 04/11/12 1140      Chief Complaint  Patient presents with  . Shortness of Breath    (Consider location/radiation/quality/duration/timing/severity/associated sxs/prior treatment) HPI Comments: Pt present to ED with C/O SOB. She reports, and EMS report that the patient was pasted on zoloft recent and had taken this medication for about 3 days. She was recently released from [facility] due to overdose. She reports tremors, jerks, swelling of the hkands and feet  And SOB.  EMS reports labored breathing, she was treated with solumedrol, benadryl and zantac during transport. The breathing has improved, but pt states she has a sensation of swelling of the throat and a burning sensation from the back of the throat to near the abd.   Past Medical History  Diagnosis Date  . Cervical abnormality   . Interstitial cystitis   . Depression   . Leukemia     Remission for 23 yrs    Past Surgical History  Procedure Date  . Cesarean section   . Leep     Family History  Problem Relation Age of Onset  . Depression Mother     History  Substance Use Topics  . Smoking status: Current Some Day Smoker -- 0.5 packs/day for 10 years    Types: Cigarettes  . Smokeless tobacco: Not on file  . Alcohol Use: Yes     Comment: 1 to 2 drinks monthly    OB History    Grav Para Term Preterm Abortions TAB SAB Ect Mult Living                  Review of Systems  Constitutional: Negative for activity change.       All ROS Neg except as noted in HPI  HENT: Negative for nosebleeds and neck pain.   Eyes: Negative for photophobia and discharge.  Respiratory: Negative for cough, shortness of breath and wheezing.   Cardiovascular: Negative for chest pain and palpitations.  Gastrointestinal: Positive for abdominal pain. Negative for blood in stool.  Genitourinary: Negative for dysuria,  frequency and hematuria.  Musculoskeletal: Negative for back pain and arthralgias.  Skin: Negative.   Neurological: Negative for dizziness, seizures and speech difficulty.  Psychiatric/Behavioral: Negative for hallucinations and confusion. The patient is nervous/anxious.        Depression    Allergies  Erythromycin; Pertussis vaccines; Tetracyclines & related; and Latex  Home Medications   Current Outpatient Rx  Name  Route  Sig  Dispense  Refill  . CLOTRIMAZOLE 1 % EX CREA   Topical   Apply 1 application topically daily as needed. Rash/itching.         . IBUPROFEN 200 MG PO TABS   Oral   Take 200 mg by mouth every 6 (six) hours as needed. Pain.         Marland Kitchen LEVONORGESTREL 20 MCG/24HR IU IUD   Intrauterine   1 Intra Uterine Device (1 each total) by Intrauterine route once.   1 each   0     For contraception.   . SERTRALINE HCL 25 MG PO TABS   Oral   Take 1 tablet (25 mg total) by mouth daily.   30 tablet   0   . TRAZODONE HCL 50 MG PO TABS   Oral   Take 1 tablet (50 mg total) by mouth at bedtime and may repeat dose one  time if needed.   30 tablet   0     BP 109/72  Pulse 88  Temp 98.8 F (37.1 C) (Oral)  Resp 15  SpO2 95%  LMP 03/20/2012  Physical Exam  Nursing note and vitals reviewed. Constitutional: She is oriented to person, place, and time. She appears well-developed and well-nourished.  Non-toxic appearance.  HENT:  Head: Normocephalic.  Right Ear: Tympanic membrane and external ear normal.  Left Ear: Tympanic membrane and external ear normal.       Swelling of the lips or tongue. The airway is patent. The uvula is in the midline.   Eyes: EOM and lids are normal. Pupils are equal, round, and reactive to light.  Neck: Normal range of motion. Neck supple. Carotid bruit is not present. No tracheal deviation present.  Cardiovascular: Normal rate, regular rhythm, normal heart sounds, intact distal pulses and normal pulses.   No murmur  heard. Pulmonary/Chest: Breath sounds normal. No respiratory distress. She has no wheezes. She has no rales.  Abdominal: Soft. Bowel sounds are normal. There is no tenderness. There is no guarding.  Musculoskeletal: Normal range of motion.  Lymphadenopathy:       Head (right side): No submandibular adenopathy present.       Head (left side): No submandibular adenopathy present.    She has no cervical adenopathy.  Neurological: She is alert and oriented to person, place, and time. She has normal strength. No cranial nerve deficit or sensory deficit. She exhibits normal muscle tone. Coordination normal.  Skin: Skin is warm and dry.       There is a fine rash on the hands, but no other rash noted. No hives appreciated.  Psychiatric: Her speech is normal. Her mood appears anxious.    ED Course  Procedures (including critical care time)  Labs Reviewed - No data to display Dg Chest 2 View  04/11/2012  *RADIOLOGY REPORT*  Clinical Data: Left  CHEST - 2 VIEW  Comparison: None.  Findings: The heart, mediastinum and hila are within normal limits. The lungs are clear.  No pleural effusion or pneumothorax.  The bony thorax and surrounding soft tissues are unremarkable.  IMPRESSION: Normal PA and lateral chest radiographs   Original Report Authenticated By: Amie Portland, M.D.    Ct Soft Tissue Neck W Contrast  04/11/2012  *RADIOLOGY REPORT*  Clinical Data: Coughing.  Hoarseness.  Throat burning and swelling. History of childhood leukemia.  CT NECK WITH CONTRAST  Technique:  Multidetector CT imaging of the neck was performed with intravenous contrast.  Contrast:  100 ml of Omnipaque-300.  Comparison: No priors.  Findings: No pathologic lymphadenopathy identified in the neck.  No abnormal rim enhancing fluid collection to suggest the presence of an abscess.  Nasopharynx, oropharynx and parapharyngeal soft tissues are unremarkable in appearance. Prevertebral soft tissues are normal.  Vascular structures are  normal in appearance. No acute abnormality of the cervical spine.  Visualized portions of the upper thorax are remarkable for high density structure in the left innominate vein, that may represents a chronically calcified fibrin sheath (particularly if the patient previously had a left-sided Port-A-Cath) or a less likely retained fragment of the catheter.  IMPRESSION: 1.  No acute abnormality in the neck to account for the patient's symptoms. 2.  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   Original Report Authenticated By: Trudie Reed, M.D.      No diagnosis found.    MDM  I have reviewed  nursing notes, vital signs, and all appropriate lab and imaging results for this patient. Xray of the chest is negative for acute problem. Soft tissue CT of the neck is neg for swelling, mass or acute problems. Pulse ox wnl the entire time the patient has been in ED. Pt has been speaking in complete sentences during the ED visit. Suspect adverse side effects from medication accompanied by panic attack. Advised family to speak with prescribing MD about tapering zoloft and possible changing to a different med. Pt to use benadryl for symptoms until seen by her MD. Pt ambulatory from ED without problem.       Kathie Dike, [place] 04/13/12 [phone]

## 2012-04-11 NOTE — ED Notes (Signed)
Per ems, pt was released from [facility] last Friday for overdose.  Pt was sent home with with prescription for Zoloft.  Pt reports several adverse side effects from the medication.  Pt reports tremors, sob, and swelling to her hands, feet, and stomach.  Per ems, pt had labored breathing upon their arrival.  Ems reports giving her 125 solumedrol, 50mg  benadryl, and Zantac during transport.  Pt is alert and oriented, breathing in wnl.  Pt reports burning to her throat and difficulty talking.

## 2012-04-11 NOTE — ED Notes (Signed)
Pt gave a urine sample, urine sample placed in blue bin.

## 2012-04-12 NOTE — Progress Notes (Signed)
Patient Discharge Instructions:  After Visit Summary (AVS):   Faxed to:  04/12/12 Psychiatric Admission Assessment Note:   Faxed to:  04/12/12 Suicide Risk Assessment - Discharge Assessment:   Faxed to:  04/12/12 Next Level Care Provider Has Access to the EMR, 04/12/12 Records provided to Dr. Dan Humphreys @ [facility] via [facility] Access  Jerelene Redden, 04/12/2012, 2:34 PM

## 2012-04-15 NOTE — ED Provider Notes (Signed)
Medical screening examination/treatment/procedure(s) were conducted as a shared visit with non-physician practitioner(s) and myself.  I personally evaluated the patient during the encounter.  Good airway. CT scan of soft tissue neck negative for obstruction  Donnetta Hutching, MD 04/15/12 1556

## 2012-04-16 NOTE — Discharge Summary (Signed)
Agree with assessment and plan Hiroko Tregre A. Raeden Schippers, M.D. 

## 2012-05-05 ENCOUNTER — Ambulatory Visit (HOSPITAL_COMMUNITY): Payer: Self-pay | Admitting: Psychiatry

## 2012-06-11 ENCOUNTER — Emergency Department: Payer: Self-pay | Admitting: Emergency Medicine

## 2012-06-11 LAB — COMPREHENSIVE METABOLIC PANEL
Anion Gap: 9 (ref 7–16)
Bilirubin,Total: 0.6 mg/dL (ref 0.2–1.0)
Calcium, Total: 9.2 mg/dL (ref 8.5–10.1)
Chloride: 104 mmol/L (ref 98–107)
Co2: 26 mmol/L (ref 21–32)
Creatinine: 0.86 mg/dL (ref 0.60–1.30)
SGOT(AST): 14 U/L — ABNORMAL LOW (ref 15–37)
SGPT (ALT): 25 U/L (ref 12–78)
Total Protein: 8 g/dL (ref 6.4–8.2)

## 2012-06-11 LAB — URINALYSIS, COMPLETE
Blood: NEGATIVE
Glucose,UR: NEGATIVE mg/dL (ref 0–75)
Ketone: NEGATIVE
Nitrite: NEGATIVE
Ph: 8 (ref 4.5–8.0)
Protein: NEGATIVE
Specific Gravity: 1.015 (ref 1.003–1.030)
Squamous Epithelial: 13
WBC UR: <1 /HPF (ref 0–5)

## 2012-06-11 LAB — CBC
MCH: 31.1 pg (ref 26.0–34.0)
MCV: 91 fL (ref 80–100)
Platelet: 193 10*3/uL (ref 150–440)
RBC: 4.99 10*6/uL (ref 3.80–5.20)
WBC: 8.4 10*3/uL (ref 3.6–11.0)

## 2012-06-11 LAB — RAPID INFLUENZA A&B ANTIGENS

## 2012-06-14 ENCOUNTER — Emergency Department (HOSPITAL_COMMUNITY)
Admission: EM | Admit: 2012-06-14 | Discharge: 2012-06-14 | Disposition: A | Discharge status: Home / Self Care | Payer: Managed Care, Other (non HMO) | Attending: Emergency Medicine | Admitting: Emergency Medicine

## 2012-06-14 ENCOUNTER — Encounter (HOSPITAL_COMMUNITY): Payer: Self-pay | Admitting: *Deleted

## 2012-06-14 DIAGNOSIS — Z79899 Other long term (current) drug therapy: Secondary | ICD-10-CM | POA: Insufficient documentation

## 2012-06-14 DIAGNOSIS — R509 Fever, unspecified: Secondary | ICD-10-CM | POA: Insufficient documentation

## 2012-06-14 DIAGNOSIS — R059 Cough, unspecified: Secondary | ICD-10-CM | POA: Insufficient documentation

## 2012-06-14 DIAGNOSIS — J3489 Other specified disorders of nose and nasal sinuses: Secondary | ICD-10-CM | POA: Insufficient documentation

## 2012-06-14 DIAGNOSIS — F3289 Other specified depressive episodes: Secondary | ICD-10-CM | POA: Insufficient documentation

## 2012-06-14 DIAGNOSIS — H9209 Otalgia, unspecified ear: Secondary | ICD-10-CM | POA: Insufficient documentation

## 2012-06-14 DIAGNOSIS — Z8742 Personal history of other diseases of the female genital tract: Secondary | ICD-10-CM | POA: Insufficient documentation

## 2012-06-14 DIAGNOSIS — Z862 Personal history of diseases of the blood and blood-forming organs and certain disorders involving the immune mechanism: Secondary | ICD-10-CM | POA: Insufficient documentation

## 2012-06-14 DIAGNOSIS — R51 Headache: Secondary | ICD-10-CM | POA: Insufficient documentation

## 2012-06-14 DIAGNOSIS — R05 Cough: Secondary | ICD-10-CM | POA: Insufficient documentation

## 2012-06-14 DIAGNOSIS — F329 Major depressive disorder, single episode, unspecified: Secondary | ICD-10-CM | POA: Insufficient documentation

## 2012-06-14 DIAGNOSIS — R0602 Shortness of breath: Secondary | ICD-10-CM | POA: Insufficient documentation

## 2012-06-14 DIAGNOSIS — F17200 Nicotine dependence, unspecified, uncomplicated: Secondary | ICD-10-CM | POA: Insufficient documentation

## 2012-06-14 DIAGNOSIS — Z8739 Personal history of other diseases of the musculoskeletal system and connective tissue: Secondary | ICD-10-CM | POA: Insufficient documentation

## 2012-06-14 DIAGNOSIS — R42 Dizziness and giddiness: Secondary | ICD-10-CM | POA: Insufficient documentation

## 2012-06-14 DIAGNOSIS — R6889 Other general symptoms and signs: Secondary | ICD-10-CM

## 2012-06-14 DIAGNOSIS — J111 Influenza due to unidentified influenza virus with other respiratory manifestations: Secondary | ICD-10-CM | POA: Insufficient documentation

## 2012-06-14 DIAGNOSIS — R110 Nausea: Secondary | ICD-10-CM | POA: Insufficient documentation

## 2012-06-14 NOTE — ED Provider Notes (Signed)
History   This chart was scribed for Joya Gaskins, MD by Charolett Bumpers, ED Scribe. The patient was seen in room APFT21/APFT21. Patient's care was started at 1343.   CSN: [id number]  Arrival date & time 06/14/12  1336   First MD Initiated Contact with Patient 06/14/12 1343      Chief Complaint  Patient presents with  . Influenza    The history is provided by the patient. No language interpreter was used.  Bethany Little is a 27 y.o. female who presents to the Emergency Department complaining of persistent, moderate cough with associated fever, SOB, headache, ear pain, congestion and slight nausea. She states that her symptoms started 3 days ago. Marland Kitchen She was seen at [facility] and was started on Tamiflu, Tylenol with codeine and Zofran. She states that she last had a fever yesterday. Temperature here in ED is 98.2.  She states that she is otherwise healthy.   Past Medical History  Diagnosis Date  . Cervical abnormality   . Interstitial cystitis   . Depression   . Leukemia     Remission for 23 yrs    Past Surgical History  Procedure Date  . Cesarean section   . Leep     Family History  Problem Relation Age of Onset  . Depression Mother     History  Substance Use Topics  . Smoking status: Current Some Day Smoker -- 0.5 packs/day for 10 years    Types: Cigarettes  . Smokeless tobacco: Not on file  . Alcohol Use: Yes     Comment: 1 to 2 drinks monthly    OB History    Grav Para Term Preterm Abortions TAB SAB Ect Mult Living                  Review of Systems  Constitutional: Positive for fever.  HENT: Positive for ear pain and congestion.   Respiratory: Positive for cough and shortness of breath.   Gastrointestinal: Positive for nausea. Negative for vomiting.  Neurological: Positive for dizziness and headaches.  All other systems reviewed and are negative.    Allergies  Erythromycin; Pertussis vaccines; Tetracyclines & related; and  Latex  Home Medications   Current Outpatient Rx  Name  Route  Sig  Dispense  Refill  . CLOTRIMAZOLE 1 % EX CREA   Topical   Apply 1 application topically daily as needed. Rash/itching.         . IBUPROFEN 200 MG PO TABS   Oral   Take 200 mg by mouth every 6 (six) hours as needed. Pain.         Marland Kitchen LEVONORGESTREL 20 MCG/24HR IU IUD   Intrauterine   1 Intra Uterine Device (1 each total) by Intrauterine route once.   1 each   0     For contraception.   . SERTRALINE HCL 25 MG PO TABS   Oral   Take 1 tablet (25 mg total) by mouth daily.   30 tablet   0   . TRAZODONE HCL 50 MG PO TABS   Oral   Take 1 tablet (50 mg total) by mouth at bedtime and may repeat dose one time if needed.   30 tablet   0     BP 126/97  Pulse 93  Temp 98.2 F (36.8 C) (Oral)  Resp 16  Ht 5\' 4"  (1.626 m)  Wt 170 lb (77.111 kg)  BMI 29.18 kg/m2  SpO2 99%  Physical Exam CONSTITUTIONAL: Well  developed/well nourished HEAD AND FACE: Normocephalic/atraumatic EYES: EOMI/PERRL ENMT: Mucous membranes moist, nasal congestion, uvula midline, pharynx normal NECK: supple no meningeal signs SPINE:entire spine nontender CV: S1/S2 noted, no murmurs/rubs/gallops noted LUNGS: Lungs are clear to auscultation bilaterally, no apparent distress ABDOMEN: soft, nontender, no rebound or guarding GU:no cva tenderness NEURO: Pt is awake/alert, moves all extremitiesx4 EXTREMITIES: pulses normal, full ROM SKIN: warm, color normal PSYCH: no abnormalities of mood noted  ED Course  Procedures (including critical care time)  DIAGNOSTIC STUDIES: Oxygen Saturation is 99% on room air, normal by my interpretation.    COORDINATION OF CARE:  13:48-Discussed planned course of treatment with the patient including continuing with her prescribed medications, fluids and rest, who is agreeable at this time. Pt is requesting a work note.  Pt here for work note.  She appears to be improving.  Her exam is unremarkable.   Stable for d/c    MDM  Nursing notes including past medical history and social history reviewed and considered in documentation    I personally performed the services described in this documentation, which was scribed in my presence. The recorded information has been reviewed and is accurate.         Joya Gaskins, MD 06/14/12 [phone]

## 2012-06-14 NOTE — ED Notes (Signed)
Pt states flu symptoms began Sunday and was seen at [facility]. PT states she needs to be rechecked for flu since work note received had her going back tomorrow per pt. Headache, earache, cough, congestion, slight nausea.

## 2012-11-02 ENCOUNTER — Emergency Department: Payer: Self-pay | Admitting: Emergency Medicine

## 2012-11-16 ENCOUNTER — Ambulatory Visit: Payer: Self-pay | Admitting: Neurology

## 2012-12-14 ENCOUNTER — Ambulatory Visit: Payer: Self-pay | Admitting: Neurology

## 2012-12-27 ENCOUNTER — Emergency Department (HOSPITAL_COMMUNITY)
Admission: EM | Admit: 2012-12-27 | Discharge: 2012-12-27 | Disposition: A | Discharge status: Home / Self Care | Payer: Managed Care, Other (non HMO) | Attending: Emergency Medicine | Admitting: Emergency Medicine

## 2012-12-27 ENCOUNTER — Encounter (HOSPITAL_COMMUNITY): Payer: Self-pay

## 2012-12-27 ENCOUNTER — Emergency Department (HOSPITAL_COMMUNITY): Payer: Managed Care, Other (non HMO)

## 2012-12-27 DIAGNOSIS — Z856 Personal history of leukemia: Secondary | ICD-10-CM | POA: Insufficient documentation

## 2012-12-27 DIAGNOSIS — N3010 Interstitial cystitis (chronic) without hematuria: Secondary | ICD-10-CM | POA: Insufficient documentation

## 2012-12-27 DIAGNOSIS — F3289 Other specified depressive episodes: Secondary | ICD-10-CM | POA: Insufficient documentation

## 2012-12-27 DIAGNOSIS — Z79899 Other long term (current) drug therapy: Secondary | ICD-10-CM | POA: Insufficient documentation

## 2012-12-27 DIAGNOSIS — R5381 Other malaise: Secondary | ICD-10-CM | POA: Insufficient documentation

## 2012-12-27 DIAGNOSIS — Z91040 Latex allergy status: Secondary | ICD-10-CM | POA: Insufficient documentation

## 2012-12-27 DIAGNOSIS — R102 Pelvic and perineal pain: Secondary | ICD-10-CM

## 2012-12-27 DIAGNOSIS — R3919 Other difficulties with micturition: Secondary | ICD-10-CM | POA: Insufficient documentation

## 2012-12-27 DIAGNOSIS — R0602 Shortness of breath: Secondary | ICD-10-CM | POA: Insufficient documentation

## 2012-12-27 DIAGNOSIS — R300 Dysuria: Secondary | ICD-10-CM | POA: Insufficient documentation

## 2012-12-27 DIAGNOSIS — F17200 Nicotine dependence, unspecified, uncomplicated: Secondary | ICD-10-CM | POA: Insufficient documentation

## 2012-12-27 DIAGNOSIS — Z8742 Personal history of other diseases of the female genital tract: Secondary | ICD-10-CM | POA: Insufficient documentation

## 2012-12-27 DIAGNOSIS — N949 Unspecified condition associated with female genital organs and menstrual cycle: Secondary | ICD-10-CM | POA: Insufficient documentation

## 2012-12-27 DIAGNOSIS — N760 Acute vaginitis: Secondary | ICD-10-CM | POA: Insufficient documentation

## 2012-12-27 DIAGNOSIS — F329 Major depressive disorder, single episode, unspecified: Secondary | ICD-10-CM | POA: Insufficient documentation

## 2012-12-27 DIAGNOSIS — B9689 Other specified bacterial agents as the cause of diseases classified elsewhere: Secondary | ICD-10-CM

## 2012-12-27 DIAGNOSIS — Z3202 Encounter for pregnancy test, result negative: Secondary | ICD-10-CM | POA: Insufficient documentation

## 2012-12-27 LAB — URINALYSIS, ROUTINE W REFLEX MICROSCOPIC
Ketones, ur: NEGATIVE mg/dL
Leukocytes, UA: NEGATIVE
Nitrite: NEGATIVE
Protein, ur: NEGATIVE mg/dL

## 2012-12-27 LAB — CBC WITH DIFFERENTIAL/PLATELET
Basophils Absolute: 0 10*3/uL (ref 0.0–0.1)
Basophils Relative: 1 % (ref 0–1)
MCHC: 34.1 g/dL (ref 30.0–36.0)
Neutro Abs: 5.5 10*3/uL (ref 1.7–7.7)
Neutrophils Relative %: 62 % (ref 43–77)
Platelets: 256 10*3/uL (ref 150–400)
RDW: 12.7 % (ref 11.5–15.5)

## 2012-12-27 LAB — POCT PREGNANCY, URINE: Preg Test, Ur: NEGATIVE

## 2012-12-27 LAB — COMPREHENSIVE METABOLIC PANEL
AST: 16 U/L (ref 0–37)
Albumin: 4.1 g/dL (ref 3.5–5.2)
Alkaline Phosphatase: 53 U/L (ref 39–117)
Chloride: 103 mEq/L (ref 96–112)
Creatinine, Ser: 0.75 mg/dL (ref 0.50–1.10)
Potassium: 3.9 mEq/L (ref 3.5–5.1)
Sodium: 139 mEq/L (ref 135–145)
Total Bilirubin: 0.4 mg/dL (ref 0.3–1.2)

## 2012-12-27 LAB — WET PREP, GENITAL

## 2012-12-27 MED ORDER — ONDANSETRON HCL 4 MG/2ML IJ SOLN
4.0000 mg | Freq: Once | INTRAMUSCULAR | Status: AC
  Administered 2012-12-27: 4 mg via INTRAVENOUS
  Filled 2012-12-27: qty 2

## 2012-12-27 MED ORDER — SODIUM CHLORIDE 0.9 % IV SOLN: 1000.0000 mL | INTRAVENOUS | Status: DC

## 2012-12-27 MED ORDER — ONDANSETRON HCL 4 MG PO TABS: 4.0000 mg | ORAL_TABLET | Freq: Four times a day (QID) | ORAL | Status: DC

## 2012-12-27 MED ORDER — HYDROCODONE-ACETAMINOPHEN 5-325 MG PO TABS: ORAL_TABLET | ORAL | Status: DC

## 2012-12-27 MED ORDER — MORPHINE SULFATE 4 MG/ML IJ SOLN
4.0000 mg | Freq: Once | INTRAMUSCULAR | Status: AC
  Administered 2012-12-27: 4 mg via INTRAVENOUS
  Filled 2012-12-27: qty 1

## 2012-12-27 MED ORDER — DEXTROSE 5 % IV SOLN
1.0000 g | Freq: Once | INTRAVENOUS | Status: AC
  Administered 2012-12-27: 1 g via INTRAVENOUS
  Filled 2012-12-27: qty 10

## 2012-12-27 MED ORDER — METRONIDAZOLE 500 MG PO TABS: 500.0000 mg | ORAL_TABLET | Freq: Two times a day (BID) | ORAL | Status: DC

## 2012-12-27 MED ORDER — SODIUM CHLORIDE 0.9 % IV SOLN
1000.0000 mL | Freq: Once | INTRAVENOUS | Status: AC
  Administered 2012-12-27: 1000 mL via INTRAVENOUS

## 2012-12-27 MED ORDER — KETOROLAC TROMETHAMINE 30 MG/ML IJ SOLN
30.0000 mg | Freq: Once | INTRAMUSCULAR | Status: AC
  Administered 2012-12-27: 30 mg via INTRAVENOUS
  Filled 2012-12-27: qty 1

## 2012-12-27 NOTE — ED Notes (Signed)
Pt reports sudden onset of rlq pain since approx 1100.  Pt doubled over in wheelchair.  Reports fell trying to get from her shower to her bedroom because pain was so severe.  Denies any n/v/d.  LBM was this am.  Denies any abnormal vaginal bleeding or discharge.  Reports pain with urination and foul smell since this morning.

## 2012-12-27 NOTE — ED Provider Notes (Signed)
CSN: [id number]     Arrival date & time 12/27/12  1253 History    This chart was scribed for Ward Givens, MD,  by Ashley Jacobs, ED Scribe. The patient was seen in room APA01/APA01 and the patient's care was started at 1:26 PM.    First MD Initiated Contact with Patient 12/27/12 1308     Chief Complaint  Patient presents with  . Abdominal Pain   The history is provided by medical records. No language interpreter was used.    HPI  HPI Comments: Bethany Little is a 27 y.o. female who presents to the Emergency Department complaining of severe constant  abdominal pain localized to R side of her abdomen with sudden onset while showering 2 hrs PTA .She mentions 6 days PTA she experienced SOB, weakness, weight gain (6 lbs) and diffused swelling.  Pt reports that abdominal pain is worsened with movement, and extension and  is relieved by nothing.  Pt denies any changes in BM other than stool appears to be softer. Pt reports that her urine has a foul smell and pain when urinating. She has a hx of ovarian cyst and abnormal cells on pap test. Pt denies nausea, vomiting, blood in stool and diarrhea. Pt denies blood in urine and vaginal discharge.  Patient relates that 11 AM while in the shower she had acute onset of severe sharp lower abdominal pain it's worse on the right than the left. She relates she's never had this before. Patient is G2 P1 Ab1, she states she has an IUD which was changed out about 6 weeks ago by Dr. Logan Bores. She states she does not have periods. She reports she's had soft stools for the past 2 days and is having one to 2 episodes a day. She denies any vaginal bleeding. She has dysuria and foul-smelling urine and sometimes has trouble urinating. She states she typically urinates 3 times a day because she has interstitial cystitis and relates that is unchanged. She states she feels weak but does not feel lightheaded. She relates over the last 2 days she's gained about 6 pounds and she has  some shortness of breath. She denies chest pain. She denies vaginal discharge or leg pain. She reports she was seen by her GYN 2 weeks ago and had an abnormal Pap smear with some abnormal cells. She states they said she did not have ovarian cyst. She reports any movement makes the pain worse. She does take in the car hitting bumps in the road made her pain worse. She states this pain is different from her interstitial cystitis or any other type of pelvic pain she's had in the past.  Pt smokes less than 1 pack a day, and does not drink. She works in as a Nature conservation officer at [employer] and reports that it is very strenuous work.  Pt has hx of two pregnancies and one delivery.  PCP Dr Dan Humphreys GYN Dr Logan Bores  Past Medical History  Diagnosis Date  . Cervical abnormality   . Interstitial cystitis   . Depression   . 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 Cesarean section    . Leep     Family History  Problem Relation Age of Onset  . Depression Mother    History  Substance Use Topics  . Smoking status: Current Some Day Smoker -- 0.50 packs/day for 10 years    Types: Cigarettes  . Smokeless tobacco: Not  on file  . Alcohol Use: Yes     Comment: 1 to 2 drinks monthly  employed    OB History   Grav Para Term Preterm Abortions TAB SAB Ect Mult Living                 Review of Systems  Respiratory: Positive for shortness of breath.   Gastrointestinal: Positive for abdominal pain (Right sided). Negative for nausea, vomiting, diarrhea and blood in stool.  Genitourinary: Positive for dysuria. Negative for vaginal bleeding and vaginal discharge.       Foul smelling urine   All other systems reviewed and are negative.    Allergies  Erythromycin; Pertussis vaccines; Tetracyclines & related; and Latex  Home Medications   Current Outpatient Rx  Name  Route  Sig  Dispense  Refill  . clotrimazole (LOTRIMIN) 1 % cream   Topical   Apply 1 application  topically daily as needed. Rash/itching.         Marland Kitchen ibuprofen (ADVIL,MOTRIN) 200 MG tablet   Oral   Take 200 mg by mouth every 6 (six) hours as needed. Pain.         Marland Kitchen levonorgestrel (MIRENA) 20 MCG/24HR IUD   Intrauterine   1 Intra Uterine Device (1 each total) by Intrauterine route once.   1 each   0     For contraception.   . sertraline (ZOLOFT) 25 MG tablet   Oral   Take 1 tablet (25 mg total) by mouth daily.   30 tablet   0   . traZODone (DESYREL) 50 MG tablet   Oral   Take 1 tablet (50 mg total) by mouth at bedtime and may repeat dose one time if needed.   30 tablet   0    BP 123/97  Pulse 90  Temp(Src) 98.4 F (36.9 C) (Oral)  Resp 24  Ht 5\' 6"  (1.676 m)  Wt 188 lb (85.276 kg)  BMI 30.36 kg/m2  SpO2 97%  Vital signs normal   Physical Exam  Nursing note and vitals reviewed. Constitutional: She is oriented to person, place, and time. She appears well-developed and well-nourished.  Non-toxic appearance. She does not appear ill. She appears distressed.  Very uncomfortable and pain is worsened by movement Lays in L lateral decubitus position  HENT:  Head: Normocephalic and atraumatic.  Right Ear: External ear normal.  Left Ear: External ear normal.  Nose: Nose normal. No mucosal edema or rhinorrhea.  Mouth/Throat: Oropharynx is clear and moist and mucous membranes are normal. No dental abscesses or edematous.  Eyes: Conjunctivae and EOM are normal. Pupils are equal, round, and reactive to light.  Neck: Normal range of motion and full passive range of motion without pain. Neck supple.  Cardiovascular: Normal rate, regular rhythm and normal heart sounds.  Exam reveals no gallop and no friction rub.   No murmur heard. Pulmonary/Chest: Effort normal and breath sounds normal. No respiratory distress. She has no wheezes. She has no rhonchi. She has no rales. She exhibits no tenderness and no crepitus.  Abdominal: Soft. Normal appearance and bowel sounds are  normal. She exhibits no distension. Tenderness: diffusely to palpation, to abd upon palpitation/ positive for Rovsing's sign. There is no rebound and no guarding.  R Flank pain to  Percussion  Diffuse abdominal pain with referral of pain to the RLQ, + Rovsings sign, guarding. No rebound  Genitourinary:  Normal external genitalia. Has a lot of white thick discharge, does not appear to be yeast,  Tender uterus and right adenexa, minimally tender on the left  Musculoskeletal: Normal range of motion. She exhibits no edema and no tenderness.  Neurological: She is alert and oriented to person, place, and time. She has normal strength. No cranial nerve deficit.  Skin: Skin is warm, dry and intact. No rash noted. No erythema. No pallor.  Psychiatric: She has a normal mood and affect. Her speech is normal and behavior is normal. Her mood appears not anxious.    ED Course    Medications  0.9 %  sodium chloride infusion (0 mLs Intravenous Stopped 12/27/12 1554)    Followed by  0.9 %  sodium chloride infusion (not administered)  morphine 4 MG/ML injection 4 mg (4 mg Intravenous Given 12/27/12 1340)  ondansetron (ZOFRAN) injection 4 mg (4 mg Intravenous Given 12/27/12 1339)  morphine 4 MG/ML injection 4 mg (4 mg Intravenous Given 12/27/12 1556)  ketorolac (TORADOL) 30 MG/ML injection 30 mg (30 mg Intravenous Given 12/27/12 1711)  cefTRIAXone (ROCEPHIN) 1 g in dextrose 5 % 50 mL IVPB (0 g Intravenous Stopped 12/27/12 1806)     DIAGNOSTIC STUDIES: Oxygen Saturation is 97% on room air , normal by my interpretation.    COORDINATION OF CARE: 1:30 PM Discussed course of care with pt which includes laboratory tests. Pt understands and agrees.    Procedures (including critical care time)  Recheck at 1545. Patient now sitting upright in her stretcher and appears to be much less painful. Her ultrasound results were discussed in need for CT explained.  1645 Patient had pelvic exam done and was given more pain  medication afterwards. Her CT results were discussed.  Patient given IV Rocephin because she has an IV intact. We discussed I felt she had a infection and should be treated with antibiotics.  Unfortunately patient is allergic to erythromycin and tetracycline. We will have to wait for her Chlamydia to come back and decide at that point what alternative treatment to give her for that.  Reviewed the [place] controlled substance site shows she has gotten about 7 or cardiac prescriptions in the past year, she has gotten prescriptions on May 19 #10 hydrocodone 5/325, June 13 #20 oxycodone 5/325, in June 19 #20 hydrocodone 5/325.  Results for orders placed during the hospital encounter of 12/27/12  WET PREP, GENITAL      Result Value Range   Yeast Wet Prep HPF POC NONE SEEN  NONE SEEN   Trich, Wet Prep NONE SEEN  NONE SEEN   Clue Cells Wet Prep HPF POC MODERATE (*) NONE SEEN   WBC, Wet Prep HPF POC MODERATE (*) NONE SEEN  URINALYSIS, ROUTINE W REFLEX MICROSCOPIC      Result Value Range   Color, Urine YELLOW  YELLOW   APPearance CLEAR  CLEAR   Specific Gravity, Urine 1.010  1.005 - 1.030   pH 6.0  5.0 - 8.0   Glucose, UA NEGATIVE  NEGATIVE mg/dL   Hgb urine dipstick NEGATIVE  NEGATIVE   Bilirubin Urine NEGATIVE  NEGATIVE   Ketones, ur NEGATIVE  NEGATIVE mg/dL   Protein, ur NEGATIVE  NEGATIVE mg/dL   Urobilinogen, UA 0.2  0.0 - 1.0 mg/dL   Nitrite NEGATIVE  NEGATIVE   Leukocytes, UA NEGATIVE  NEGATIVE  CBC WITH DIFFERENTIAL      Result Value Range   WBC 8.9  4.0 - 10.5 K/uL   RBC 4.85  3.87 - 5.11 MIL/uL   Hemoglobin 15.4 (*) 12.0 - 15.0 g/dL   HCT 45.2  36.0 - 46.0 %   MCV 93.2  78.0 - 100.0 fL   MCH 31.8  26.0 - 34.0 pg   MCHC 34.1  30.0 - 36.0 g/dL   RDW 16.1  09.6 - 04.5 %   Platelets 256  150 - 400 K/uL   Neutrophils Relative % 62  43 - 77 %   Neutro Abs 5.5  1.7 - 7.7 K/uL   Lymphocytes Relative 28  12 - 46 %   Lymphs Abs 2.5  0.7 - 4.0 K/uL   Monocytes Relative 6  3 -  12 %   Monocytes Absolute 0.6  0.1 - 1.0 K/uL   Eosinophils Relative 4  0 - 5 %   Eosinophils Absolute 0.3  0.0 - 0.7 K/uL   Basophils Relative 1  0 - 1 %   Basophils Absolute 0.0  0.0 - 0.1 K/uL  COMPREHENSIVE METABOLIC PANEL      Result Value Range   Sodium 139  135 - 145 mEq/L   Potassium 3.9  3.5 - 5.1 mEq/L   Chloride 103  96 - 112 mEq/L   CO2 26  19 - 32 mEq/L   Glucose, Bld 86  70 - 99 mg/dL   BUN 14  6 - 23 mg/dL   Creatinine, Ser 4.09  0.50 - 1.10 mg/dL   Calcium 9.6  8.4 - 81.1 mg/dL   Total Protein 7.5  6.0 - 8.3 g/dL   Albumin 4.1  3.5 - 5.2 g/dL   AST 16  0 - 37 U/L   ALT 15  0 - 35 U/L   Alkaline Phosphatase 53  39 - 117 U/L   Total Bilirubin 0.4  0.3 - 1.2 mg/dL   GFR calc non Af Amer >90  >90 mL/min   GFR calc Af Amer >90  >90 mL/min  POCT PREGNANCY, URINE      Result Value Range   Preg Test, Ur NEGATIVE  NEGATIVE  ABO/RH      Result Value Range   ABO/RH(D) A NEG     Laboratory interpretation all normal except BV   Ct Abdomen Pelvis Wo Contrast  12/27/2012   *RADIOLOGY REPORT*  Clinical Data: Right lower quadrant pain  CT ABDOMEN AND PELVIS WITHOUT CONTRAST  Technique:  Multidetector CT imaging of the abdomen and pelvis was performed following the standard protocol without intravenous contrast.  Comparison: None.  Findings: The lung bases are free of acute infiltrate or sizable effusion.  The liver, gallbladder, spleen, adrenal glands and pancreas are all well visualized and normal in their CT appearance.  Kidneys are well seen and reveal no mass lesion or hydronephrosis. No renal calculi are identified.  Scanning into the pelvis reveals the appendix to be within normal limits.  The bladder is partially distended.  An IUD is noted within the uterus.  It appears appropriately placed.  Mild ovarian follicular changes are seen.  Minimal free pelvic fluid is noted likely physiologic in nature.  The bony structures are within normal limits.  IMPRESSION: No acute  abnormality is noted.   Original Report Authenticated By: Alcide Clever, M.D.   US Transvaginal Non-ob  US Pelvis Complete  12/27/2012   *RADIOLOGY REPORT*  Clinical Data: Right lower quadrant pain  TRANSABDOMINAL AND TRANSVAGINAL ULTRASOUND OF PELVIS Technique:  Both transabdominal and transvaginal ultrasound examinations of the pelvis were performed. Transabdominal technique was performed for global imaging of the pelvis including uterus, ovaries, adnexal regions, and pelvic cul-de-sac.  It was necessary to proceed  with endovaginal exam following the transabdominal exam to visualize the bilateral ovaries.  Comparison:  None  Findings:  Uterus: Normal in size and appearance, measuring 7.8 x 4.0 x 4.2 cm.  Endometrium: Measures 5 mm.  IUD in satisfactory position.  Right ovary:  Normal appearance/no adnexal mass, measuring 2.5 x 2.0 x 1.5 cm.  Left ovary: Normal appearance/no adnexal mass, measuring 3.0 x 2.0 x 2.0 cm.  Other findings: Small volume pelvic ascites.  IMPRESSION: Normal study.  No evidence of pelvic mass or other significant abnormality.   Original Report Authenticated By: Charline Bills, M.D.     1. Pelvic pain   2. BV (bacterial vaginosis)     New Prescriptions   HYDROCODONE-ACETAMINOPHEN (NORCO/VICODIN) 5-325 MG PER TABLET    Take 1 or 2 po Q 6hrs for pain   METRONIDAZOLE (FLAGYL) 500 MG TABLET    Take 1 tablet (500 mg total) by mouth 2 (two) times daily.   ONDANSETRON (ZOFRAN) 4 MG TABLET    Take 1 tablet (4 mg total) by mouth every 6 (six) hours.     Plan discharge  Devoria Albe, MD, FACEP   MDM    I personally performed the services described in this documentation, which was scribed in my presence. The recorded information has been reviewed and considered.    Ward Givens, MD 12/27/12 [facility] Moment

## 2012-12-28 LAB — GC/CHLAMYDIA PROBE AMP
CT Probe RNA: NEGATIVE
GC Probe RNA: NEGATIVE

## 2012-12-31 ENCOUNTER — Emergency Department (HOSPITAL_COMMUNITY): Payer: Managed Care, Other (non HMO)

## 2012-12-31 ENCOUNTER — Emergency Department (HOSPITAL_COMMUNITY)
Admission: EM | Admit: 2012-12-31 | Discharge: 2012-12-31 | Disposition: A | Discharge status: Home / Self Care | Payer: Managed Care, Other (non HMO) | Attending: Emergency Medicine | Admitting: Emergency Medicine

## 2012-12-31 ENCOUNTER — Encounter (HOSPITAL_COMMUNITY): Payer: Self-pay | Admitting: Emergency Medicine

## 2012-12-31 DIAGNOSIS — R300 Dysuria: Secondary | ICD-10-CM | POA: Insufficient documentation

## 2012-12-31 DIAGNOSIS — F3289 Other specified depressive episodes: Secondary | ICD-10-CM | POA: Insufficient documentation

## 2012-12-31 DIAGNOSIS — R102 Pelvic and perineal pain: Secondary | ICD-10-CM

## 2012-12-31 DIAGNOSIS — N949 Unspecified condition associated with female genital organs and menstrual cycle: Secondary | ICD-10-CM | POA: Insufficient documentation

## 2012-12-31 DIAGNOSIS — R630 Anorexia: Secondary | ICD-10-CM | POA: Insufficient documentation

## 2012-12-31 DIAGNOSIS — Z79899 Other long term (current) drug therapy: Secondary | ICD-10-CM | POA: Insufficient documentation

## 2012-12-31 DIAGNOSIS — M7989 Other specified soft tissue disorders: Secondary | ICD-10-CM | POA: Insufficient documentation

## 2012-12-31 DIAGNOSIS — N898 Other specified noninflammatory disorders of vagina: Secondary | ICD-10-CM | POA: Insufficient documentation

## 2012-12-31 DIAGNOSIS — Z8742 Personal history of other diseases of the female genital tract: Secondary | ICD-10-CM | POA: Insufficient documentation

## 2012-12-31 DIAGNOSIS — H538 Other visual disturbances: Secondary | ICD-10-CM | POA: Insufficient documentation

## 2012-12-31 DIAGNOSIS — R197 Diarrhea, unspecified: Secondary | ICD-10-CM | POA: Insufficient documentation

## 2012-12-31 DIAGNOSIS — F17200 Nicotine dependence, unspecified, uncomplicated: Secondary | ICD-10-CM | POA: Insufficient documentation

## 2012-12-31 DIAGNOSIS — Z9889 Other specified postprocedural states: Secondary | ICD-10-CM | POA: Insufficient documentation

## 2012-12-31 DIAGNOSIS — Z3202 Encounter for pregnancy test, result negative: Secondary | ICD-10-CM | POA: Insufficient documentation

## 2012-12-31 DIAGNOSIS — R112 Nausea with vomiting, unspecified: Secondary | ICD-10-CM | POA: Insufficient documentation

## 2012-12-31 DIAGNOSIS — M2559 Pain in other specified joint: Secondary | ICD-10-CM | POA: Insufficient documentation

## 2012-12-31 DIAGNOSIS — Z91040 Latex allergy status: Secondary | ICD-10-CM | POA: Insufficient documentation

## 2012-12-31 DIAGNOSIS — F329 Major depressive disorder, single episode, unspecified: Secondary | ICD-10-CM | POA: Insufficient documentation

## 2012-12-31 LAB — COMPREHENSIVE METABOLIC PANEL
ALT: 14 U/L (ref 0–35)
AST: 16 U/L (ref 0–37)
Calcium: 9.3 mg/dL (ref 8.4–10.5)
Sodium: 139 mEq/L (ref 135–145)
Total Protein: 7 g/dL (ref 6.0–8.3)

## 2012-12-31 LAB — URINE MICROSCOPIC-ADD ON

## 2012-12-31 LAB — CBC WITH DIFFERENTIAL/PLATELET
Basophils Absolute: 0.1 10*3/uL (ref 0.0–0.1)
Eosinophils Absolute: 0.2 10*3/uL (ref 0.0–0.7)
Eosinophils Relative: 2 % (ref 0–5)
Lymphocytes Relative: 26 % (ref 12–46)
MCH: 31.5 pg (ref 26.0–34.0)
MCV: 93.4 fL (ref 78.0–100.0)
Platelets: 242 10*3/uL (ref 150–400)
RDW: 12.8 % (ref 11.5–15.5)
WBC: 10.2 10*3/uL (ref 4.0–10.5)

## 2012-12-31 LAB — URINALYSIS, ROUTINE W REFLEX MICROSCOPIC
Hgb urine dipstick: NEGATIVE
Specific Gravity, Urine: 1.01 (ref 1.005–1.030)
Urobilinogen, UA: 0.2 mg/dL (ref 0.0–1.0)
pH: 7.5 (ref 5.0–8.0)

## 2012-12-31 MED ORDER — HYDROMORPHONE HCL PF 1 MG/ML IJ SOLN
1.0000 mg | Freq: Once | INTRAMUSCULAR | Status: AC
  Administered 2012-12-31: 1 mg via INTRAVENOUS
  Filled 2012-12-31: qty 1

## 2012-12-31 MED ORDER — IOHEXOL 300 MG/ML  SOLN
50.0000 mL | Freq: Once | INTRAMUSCULAR | Status: AC | PRN
  Administered 2012-12-31: 50 mL via ORAL

## 2012-12-31 MED ORDER — HYDROMORPHONE HCL 2 MG PO TABS: 2.0000 mg | ORAL_TABLET | ORAL | Status: DC | PRN

## 2012-12-31 MED ORDER — SODIUM CHLORIDE 0.9 % IV SOLN: INTRAVENOUS | Status: DC

## 2012-12-31 MED ORDER — METRONIDAZOLE 500 MG PO TABS: 500.0000 mg | ORAL_TABLET | Freq: Two times a day (BID) | ORAL | Status: DC

## 2012-12-31 MED ORDER — HYDROMORPHONE HCL PF 1 MG/ML IJ SOLN
0.5000 mg | Freq: Once | INTRAMUSCULAR | Status: AC
  Administered 2012-12-31: 0.5 mg via INTRAVENOUS
  Filled 2012-12-31: qty 1

## 2012-12-31 MED ORDER — CLINDAMYCIN HCL 150 MG PO CAPS: 300.0000 mg | ORAL_CAPSULE | Freq: Three times a day (TID) | ORAL | Status: DC

## 2012-12-31 MED ORDER — VANCOMYCIN HCL IN DEXTROSE 1-5 GM/200ML-% IV SOLN
INTRAVENOUS | Status: AC
  Filled 2012-12-31: qty 200

## 2012-12-31 MED ORDER — ONDANSETRON HCL 4 MG/2ML IJ SOLN
4.0000 mg | Freq: Once | INTRAMUSCULAR | Status: AC
  Administered 2012-12-31: 4 mg via INTRAVENOUS
  Filled 2012-12-31: qty 2

## 2012-12-31 MED ORDER — HYDROCODONE-ACETAMINOPHEN 5-325 MG PO TABS: 1.0000 | ORAL_TABLET | Freq: Four times a day (QID) | ORAL | Status: DC | PRN

## 2012-12-31 MED ORDER — IOHEXOL 300 MG/ML  SOLN
100.0000 mL | Freq: Once | INTRAMUSCULAR | Status: AC | PRN
  Administered 2012-12-31: 100 mL via INTRAVENOUS

## 2012-12-31 MED ORDER — SODIUM CHLORIDE 0.9 % IV BOLUS (SEPSIS)
1000.0000 mL | Freq: Once | INTRAVENOUS | Status: AC
  Administered 2012-12-31: 1000 mL via INTRAVENOUS

## 2012-12-31 NOTE — ED Notes (Signed)
Pt refused pelvic exam and is requesting more pain medication. Dr. Deretha Emory aware.

## 2012-12-31 NOTE — ED Notes (Signed)
Pt c/o right lower abdominal and pelvic pain since Wednesday. Pt was seen in ED that day and told she had an infection. Pt states symptoms have not improved. Pt states N/V/D began this morning. Pt reports gaining 8 pounds since Wednesday.

## 2012-12-31 NOTE — ED Notes (Signed)
States that she started having right lower abdominal pain with nausea, vomiting and diarrhea on Wednesday and was evaluated in the ER, states that her symptoms have not improved.  The pt states that she has gained 8 pounds since Wednesday.

## 2012-12-31 NOTE — ED Notes (Signed)
Dc instructions reviewed with pt and explained. F/u appt discussed and 4 Rx given to pt. Ambulated out without difficulty.

## 2012-12-31 NOTE — ED Provider Notes (Signed)
CSN: [id number]     Arrival date & time 12/31/12  1212 History  This chart was scribed for Shelda Jakes, MD by Greggory Stallion, ED Scribe. This patient was seen in room APA10/[facility]10 and the patient's care was started at 1:57 PM.   Chief Complaint  Patient presents with  . Abdominal Pain  . Nausea  . Emesis  . Diarrhea   Patient is a 27 y.o. female presenting with abdominal pain. The history is provided by the patient. No language interpreter was used.  Abdominal Pain Pain location:  RLQ Pain quality: sharp   Pain radiates to:  Back Pain severity:  Moderate Onset quality:  Gradual Duration:  4 days Timing:  Constant Progression:  Waxing and waning Chronicity:  New Relieved by:  Nothing Worsened by:  Nothing tried Ineffective treatments: hydrocodone, Zofran. Associated symptoms: dysuria, nausea and vaginal bleeding   Associated symptoms: no chest pain, no cough and no shortness of breath     HPI Comments: Bethany Little is a 27 y.o. female who presents to the Emergency Department complaining of gradual onset, waxing and waning sharp RLQ abdominal pain that started Wednesday. Pt rates her pain 10/10 at it's worst and 6/10 at best. She states nausea, emesis and diarrhea started this morning. She states the pain radiates to her back. Pt has dysuria, mild blurred vision and some vaginal bleeding when wipes. Pt has had leg swelling for 2 weeks. She had a severe migraine last night. She states she hasn't been eating very much but has gained 8 pounds since Wednesday. Pt was evaluated in the ED on Wednesday and states her symptoms have not improved. She states the pain medication she was given hasn't relieved any pain. Pt denies fever, rash, congestion, cough, rhinorrhea, CP, trouble breathing, SOB, hematochezia and bleeding easily as associated symptoms. She has an IUD in place.   PCP is Dr. Yates Decamp OB is at same practice as Dr. Dan Humphreys  Past Medical History  Diagnosis Date  .  Cervical abnormality   . Interstitial cystitis   . Depression   . 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 Cesarean section    . Leep     Family History  Problem Relation Age of Onset  . Depression Mother    History  Substance Use Topics  . Smoking status: Current Some Day Smoker -- 0.50 packs/day for 10 years    Types: Cigarettes  . Smokeless tobacco: Not on file  . Alcohol Use: Yes     Comment: 1 to 2 drinks monthly   OB History   Grav Para Term Preterm Abortions TAB SAB Ect Mult Living                 Review of Systems  Constitutional: Positive for appetite change.  HENT: Negative for congestion and rhinorrhea.   Eyes: Positive for visual disturbance.  Respiratory: Negative for cough and shortness of breath.   Cardiovascular: Positive for leg swelling. Negative for chest pain.  Gastrointestinal: Positive for nausea and abdominal pain. Negative for blood in stool.  Genitourinary: Positive for dysuria and vaginal bleeding.  Musculoskeletal: Positive for back pain and arthralgias.  Skin: Negative for rash.  Hematological: Does not bruise/bleed easily.  Psychiatric/Behavioral: Negative for confusion.  All other systems reviewed and are negative.    Allergies  Erythromycin; Pertussis vaccines; Tetracyclines & related; and Latex  Home Medications   Current Outpatient Rx  Name  Route  Sig  Dispense  Refill  . diphenhydramine-acetaminophen (TYLENOL PM) 25-500 MG TABS   Oral   Take 1 tablet by mouth at bedtime as needed (sleep).         . gabapentin (NEURONTIN) 300 MG capsule   Oral   Take 300 mg by mouth 4 (four) times daily.         Marland Kitchen HYDROcodone-acetaminophen (NORCO/VICODIN) 5-325 MG per tablet      Take 1 or 2 po Q 6hrs for pain   10 tablet   0   . levonorgestrel (MIRENA) 20 MCG/24HR IUD   Intrauterine   1 Intra Uterine Device (1 each total) by Intrauterine route once.   1 each   0     For  contraception.   . metroNIDAZOLE (FLAGYL) 500 MG tablet   Oral   Take 1 tablet (500 mg total) by mouth 2 (two) times daily.   28 tablet   0   . ondansetron (ZOFRAN) 4 MG tablet   Oral   Take 1 tablet (4 mg total) by mouth every 6 (six) hours.   12 tablet   0   . pentosan polysulfate (ELMIRON) 100 MG capsule   Oral   Take 100 mg by mouth 3 (three) times daily before meals.         . promethazine (PHENERGAN) 25 MG tablet   Oral   Take 25 mg by mouth every 6 (six) hours as needed for nausea.         . traZODone (DESYREL) 50 MG tablet   Oral   Take 50-100 mg by mouth at bedtime as needed for sleep.         . vitamin B-12 (CYANOCOBALAMIN) 1000 MCG tablet   Oral   Take 1,000 mcg by mouth daily.         . clindamycin (CLEOCIN) 150 MG capsule   Oral   Take 2 capsules (300 mg total) by mouth 3 (three) times daily.   30 capsule   0   . HYDROcodone-acetaminophen (NORCO/VICODIN) 5-325 MG per tablet   Oral   Take 1-2 tablets by mouth every 6 (six) hours as needed for pain.   20 tablet   0   . HYDROmorphone (DILAUDID) 2 MG tablet   Oral   Take 1 tablet (2 mg total) by mouth every 4 (four) hours as needed for pain.   20 tablet   0   . metroNIDAZOLE (FLAGYL) 500 MG tablet   Oral   Take 1 tablet (500 mg total) by mouth 2 (two) times daily.   28 tablet   0    BP 127/77  Pulse 98  Temp(Src) 98 F (36.7 C) (Oral)  Resp 18  Ht 5\' 4"  (1.626 m)  Wt 196 lb (88.905 kg)  BMI 33.63 kg/m2  SpO2 100%  Physical Exam  Nursing note and vitals reviewed. Constitutional: She is oriented to person, place, and time. She appears well-developed and well-nourished. No distress.  HENT:  Head: Normocephalic and atraumatic.  Mucous membranes moist.   Eyes: EOM are normal. No scleral icterus.  Neck: Normal range of motion. Neck supple. No tracheal deviation present.  Cardiovascular: Normal rate, regular rhythm and normal heart sounds.   No murmur heard. Pulmonary/Chest: Effort  normal and breath sounds normal. No respiratory distress. She has no wheezes. She has no rales.  Lungs clear bilaterally.   Abdominal: Soft.  Tenderness in RLQ and super pubic region. No upper quadrant tenderness.  Musculoskeletal: Normal range of motion.  No pitting on the ankles.   Neurological: She is alert and oriented to person, place, and time.  Skin: Skin is warm and dry. No rash noted.  Psychiatric: She has a normal mood and affect. Her behavior is normal.    ED Course   Procedures (including critical care time)  Medications  0.9 %  sodium chloride infusion (not administered)  vancomycin (VANCOCIN) 1 GM/200ML IVPB (  Not Given 12/31/12 1513)  sodium chloride 0.9 % bolus 1,000 mL (0 mLs Intravenous Stopped 12/31/12 1823)  ondansetron (ZOFRAN) injection 4 mg (4 mg Intravenous Given 12/31/12 1515)  HYDROmorphone (DILAUDID) injection 1 mg (1 mg Intravenous Given 12/31/12 1517)  iohexol (OMNIPAQUE) 300 MG/ML solution 50 mL (50 mLs Oral Contrast Given 12/31/12 1556)  iohexol (OMNIPAQUE) 300 MG/ML solution 100 mL (100 mLs Intravenous Contrast Given 12/31/12 1557)  HYDROmorphone (DILAUDID) injection 0.5 mg (0.5 mg Intravenous Given 12/31/12 1548)  HYDROmorphone (DILAUDID) injection 0.5 mg (0.5 mg Intravenous Given 12/31/12 1827)    DIAGNOSTIC STUDIES: Oxygen Saturation is 100% on RA, normal by my interpretation.    COORDINATION OF CARE: 2:34 PM-Discussed treatment plan which includes pelvic and CT with contrast with pt at bedside and pt agreed to plan.   Labs Reviewed  URINALYSIS, ROUTINE W REFLEX MICROSCOPIC - Abnormal; Notable for the following:    Leukocytes, UA TRACE (*)    All other components within normal limits  URINE MICROSCOPIC-ADD ON - Abnormal; Notable for the following:    Squamous Epithelial / LPF FEW (*)    All other components within normal limits  CBC WITH DIFFERENTIAL - Abnormal; Notable for the following:    Hemoglobin 15.3 (*)    All other components within  normal limits  COMPREHENSIVE METABOLIC PANEL - Abnormal; Notable for the following:    Total Bilirubin 0.2 (*)    GFR calc non Af Amer 86 (*)    All other components within normal limits  GC/CHLAMYDIA PROBE AMP  WET PREP, GENITAL  PREGNANCY, URINE     Results for orders placed during the hospital encounter of 12/31/12  PREGNANCY, URINE      Result Value Range   Preg Test, Ur NEGATIVE  NEGATIVE  URINALYSIS, ROUTINE W REFLEX MICROSCOPIC      Result Value Range   Color, Urine YELLOW  YELLOW   APPearance CLEAR  CLEAR   Specific Gravity, Urine 1.010  1.005 - 1.030   pH 7.5  5.0 - 8.0   Glucose, UA NEGATIVE  NEGATIVE mg/dL   Hgb urine dipstick NEGATIVE  NEGATIVE   Bilirubin Urine NEGATIVE  NEGATIVE   Ketones, ur NEGATIVE  NEGATIVE mg/dL   Protein, ur NEGATIVE  NEGATIVE mg/dL   Urobilinogen, UA 0.2  0.0 - 1.0 mg/dL   Nitrite NEGATIVE  NEGATIVE   Leukocytes, UA TRACE (*) NEGATIVE  URINE MICROSCOPIC-ADD ON      Result Value Range   Squamous Epithelial / LPF FEW (*) RARE   WBC, UA 3-6  <3 WBC/hpf   RBC / HPF 0-2  <3 RBC/hpf   Bacteria, UA RARE  RARE  CBC WITH DIFFERENTIAL      Result Value Range   WBC 10.2  4.0 - 10.5 K/uL   RBC 4.86  3.87 - 5.11 MIL/uL   Hemoglobin 15.3 (*) 12.0 - 15.0 g/dL   HCT 69.6  29.5 - 28.4 %   MCV 93.4  78.0 - 100.0 fL   MCH 31.5  26.0 - 34.0 pg  MCHC 33.7  30.0 - 36.0 g/dL   RDW 40.9  81.1 - 91.4 %   Platelets 242  150 - 400 K/uL   Neutrophils Relative % 65  43 - 77 %   Neutro Abs 6.7  1.7 - 7.7 K/uL   Lymphocytes Relative 26  12 - 46 %   Lymphs Abs 2.6  0.7 - 4.0 K/uL   Monocytes Relative 6  3 - 12 %   Monocytes Absolute 0.6  0.1 - 1.0 K/uL   Eosinophils Relative 2  0 - 5 %   Eosinophils Absolute 0.2  0.0 - 0.7 K/uL   Basophils Relative 1  0 - 1 %   Basophils Absolute 0.1  0.0 - 0.1 K/uL  COMPREHENSIVE METABOLIC PANEL      Result Value Range   Sodium 139  135 - 145 mEq/L   Potassium 3.9  3.5 - 5.1 mEq/L   Chloride 103  96 - 112 mEq/L    CO2 28  19 - 32 mEq/L   Glucose, Bld 86  70 - 99 mg/dL   BUN 13  6 - 23 mg/dL   Creatinine, Ser 7.82  0.50 - 1.10 mg/dL   Calcium 9.3  8.4 - 95.6 mg/dL   Total Protein 7.0  6.0 - 8.3 g/dL   Albumin 3.8  3.5 - 5.2 g/dL   AST 16  0 - 37 U/L   ALT 14  0 - 35 U/L   Alkaline Phosphatase 48  39 - 117 U/L   Total Bilirubin 0.2 (*) 0.3 - 1.2 mg/dL   GFR calc non Af Amer 86 (*) >90 mL/min   GFR calc Af Amer >90  >90 mL/min    Ct Abdomen Pelvis W Contrast  12/31/2012   *RADIOLOGY REPORT*  Clinical Data: Right lower quadrant pain  CT ABDOMEN AND PELVIS WITH CONTRAST  Technique:  Multidetector CT imaging of the abdomen and pelvis was performed following the standard protocol during bolus administration of intravenous contrast.  Contrast: 50mL OMNIPAQUE IOHEXOL 300 MG/ML  SOLN, OMNIPAQUE IOHEXOL 300 MG/ML  SOLN  Comparison: 12/27/2012  Findings: Normal appendix.  The liver, gallbladder, spleen, pancreas, kidneys, and adrenal glands are within normal limits.  Right ovary is unremarkable.  IUD is in place within the fundal endometrium.  Collapsing cyst in the left ovary.  Small amount of layering free fluid in the pelvis is likely physiologic.  Unremarkable bladder.  Left sacral sclerotic lesion on image 60 has a benign appearance.  IMPRESSION: No acute pathology.   Original Report Authenticated By: Jolaine Click, M.D.   1. Pelvic pain     MDM  Patient returns with persistent right lower corner abdominal pain pelvic pain since August 6. Patient was seen in the emergency per my August 6 by Dr. Lynelle Doctor head CT scan without contrast sagittal colic ultrasound pelvic examination cultures wet prep and lab work. Patient was treated as if clinically this could be PID she was given a Rocephin. Her cultures for GC and chlamydia are negative. Patient's wet prep was consistent with a bacterial vaginosis. Patient was treated with Flagyl, patient was not treated with doxycycline because she has an allergy to doxycycline.  Today's workup without any significant changes. Patient refused pelvic examination today it is possible that this could be untreated PID but without a significant increase in her leukocytosis or fever I suspect that this is more of noninfectious pelvic pain. However patient's antibiotics will be changed to clindamycin in lieu of the doxycycline  and she'll continue the Flagyl. Pain medication provided. Patient has a GYN Dr. she's been unable to get followup with him in the [facility] area patient given referral here also recommend followup with her GYN as early as possible if she can't then call [facility] here. Patient pain improved in the emergency department. Patient sent home on hydrocodone and the lauded for pain control. Patient's urinalysis is negative for UTI patient's pregnancy test is again negative. Patient had repeat CT scan here today with contrast no significant intra-abdominal findings were pelvic findings.       I personally performed the services described in this documentation, which was scribed in my presence. The recorded information has been reviewed and is accurate.    Shelda Jakes, MD 12/31/12 2042

## 2013-01-01 ENCOUNTER — Other Ambulatory Visit: Payer: Self-pay | Admitting: Gastroenterology

## 2013-01-01 LAB — CLOSTRIDIUM DIFFICILE BY PCR

## 2013-01-02 LAB — WBCS, STOOL

## 2013-01-04 ENCOUNTER — Emergency Department: Payer: Self-pay | Admitting: Emergency Medicine

## 2013-01-04 LAB — COMPREHENSIVE METABOLIC PANEL
Albumin: 4.1 g/dL (ref 3.4–5.0)
BUN: 11 mg/dL (ref 7–18)
Bilirubin,Total: 0.5 mg/dL (ref 0.2–1.0)
Co2: 25 mmol/L (ref 21–32)
Creatinine: 0.79 mg/dL (ref 0.60–1.30)
EGFR (Non-African Amer.): >60
Glucose: 90 mg/dL (ref 65–99)
Osmolality: 278 (ref 275–301)
Potassium: 3.6 mmol/L (ref 3.5–5.1)
SGPT (ALT): 47 U/L (ref 12–78)
Sodium: 140 mmol/L (ref 136–145)

## 2013-01-04 LAB — URINALYSIS, COMPLETE
Ph: 9 (ref 4.5–8.0)
Protein: 25
RBC,UR: 79 /HPF (ref 0–5)
Specific Gravity: 1.005 (ref 1.003–1.030)
Squamous Epithelial: 3
WBC UR: 2 /HPF (ref 0–5)

## 2013-01-04 LAB — DRUG SCREEN, URINE
Barbiturates, Ur Screen: NEGATIVE (ref ?–200)
Cannabinoid 50 Ng, Ur ~~LOC~~: NEGATIVE (ref ?–50)
Cocaine Metabolite,Ur ~~LOC~~: NEGATIVE (ref ?–300)
MDMA (Ecstasy)Ur Screen: NEGATIVE (ref ?–500)
Methadone, Ur Screen: NEGATIVE (ref ?–300)
Opiate, Ur Screen: NEGATIVE (ref ?–300)
Phencyclidine (PCP) Ur S: NEGATIVE (ref ?–25)

## 2013-01-04 LAB — CBC WITH DIFFERENTIAL/PLATELET
Basophil #: 0 10*3/uL (ref 0.0–0.1)
Basophil %: 0.5 %
Eosinophil #: 0 10*3/uL (ref 0.0–0.7)
HCT: 46.6 % (ref 35.0–47.0)
HGB: 16.3 g/dL — ABNORMAL HIGH (ref 12.0–16.0)
Lymphocyte #: 1.6 10*3/uL (ref 1.0–3.6)
MCHC: 34.9 g/dL (ref 32.0–36.0)
Monocyte %: 4.8 %
Neutrophil #: 7.9 10*3/uL — ABNORMAL HIGH (ref 1.4–6.5)
RDW: 12.6 % (ref 11.5–14.5)

## 2013-01-04 LAB — LIPASE, BLOOD: Lipase: 112 U/L (ref 73–393)

## 2013-01-04 LAB — ETHANOL: Ethanol %: <0.003 % (ref 0.000–0.080)

## 2013-01-05 LAB — STOOL CULTURE

## 2013-01-16 ENCOUNTER — Ambulatory Visit: Payer: Self-pay | Admitting: Obstetrics and Gynecology

## 2013-01-16 LAB — PREGNANCY, URINE: Pregnancy Test, Urine: NEGATIVE m[IU]/mL

## 2013-01-19 ENCOUNTER — Ambulatory Visit: Payer: Self-pay | Admitting: Obstetrics and Gynecology

## 2013-02-12 ENCOUNTER — Ambulatory Visit: Payer: Self-pay | Admitting: Gastroenterology

## 2013-02-13 LAB — PATHOLOGY REPORT

## 2013-06-20 ENCOUNTER — Encounter (HOSPITAL_COMMUNITY): Payer: Self-pay | Admitting: Emergency Medicine

## 2013-06-20 ENCOUNTER — Emergency Department (HOSPITAL_COMMUNITY): Payer: Managed Care, Other (non HMO)

## 2013-06-20 ENCOUNTER — Emergency Department (HOSPITAL_COMMUNITY)
Admission: EM | Admit: 2013-06-20 | Discharge: 2013-06-20 | Disposition: A | Discharge status: Home / Self Care | Payer: Managed Care, Other (non HMO) | Attending: Emergency Medicine | Admitting: Emergency Medicine

## 2013-06-20 DIAGNOSIS — F3289 Other specified depressive episodes: Secondary | ICD-10-CM | POA: Insufficient documentation

## 2013-06-20 DIAGNOSIS — Z79899 Other long term (current) drug therapy: Secondary | ICD-10-CM | POA: Insufficient documentation

## 2013-06-20 DIAGNOSIS — Z91040 Latex allergy status: Secondary | ICD-10-CM | POA: Insufficient documentation

## 2013-06-20 DIAGNOSIS — N3010 Interstitial cystitis (chronic) without hematuria: Secondary | ICD-10-CM | POA: Insufficient documentation

## 2013-06-20 DIAGNOSIS — Z856 Personal history of leukemia: Secondary | ICD-10-CM | POA: Insufficient documentation

## 2013-06-20 DIAGNOSIS — F329 Major depressive disorder, single episode, unspecified: Secondary | ICD-10-CM | POA: Insufficient documentation

## 2013-06-20 DIAGNOSIS — F17200 Nicotine dependence, unspecified, uncomplicated: Secondary | ICD-10-CM | POA: Insufficient documentation

## 2013-06-20 DIAGNOSIS — R69 Illness, unspecified: Secondary | ICD-10-CM

## 2013-06-20 DIAGNOSIS — J111 Influenza due to unidentified influenza virus with other respiratory manifestations: Secondary | ICD-10-CM | POA: Insufficient documentation

## 2013-06-20 DIAGNOSIS — Z3202 Encounter for pregnancy test, result negative: Secondary | ICD-10-CM | POA: Insufficient documentation

## 2013-06-20 DIAGNOSIS — Z8742 Personal history of other diseases of the female genital tract: Secondary | ICD-10-CM | POA: Insufficient documentation

## 2013-06-20 DIAGNOSIS — R110 Nausea: Secondary | ICD-10-CM | POA: Insufficient documentation

## 2013-06-20 LAB — CBC WITH DIFFERENTIAL/PLATELET
BASOS ABS: 0.1 10*3/uL (ref 0.0–0.1)
Basophils Relative: 1 % (ref 0–1)
Eosinophils Absolute: 0.1 10*3/uL (ref 0.0–0.7)
Eosinophils Relative: 1 % (ref 0–5)
HCT: 48.4 % — ABNORMAL HIGH (ref 36.0–46.0)
HEMOGLOBIN: 16.5 g/dL — AB (ref 12.0–15.0)
LYMPHS PCT: 29 % (ref 12–46)
Lymphs Abs: 2.5 10*3/uL (ref 0.7–4.0)
MCH: 31.5 pg (ref 26.0–34.0)
MCHC: 34.1 g/dL (ref 30.0–36.0)
MCV: 92.4 fL (ref 78.0–100.0)
MONO ABS: 0.9 10*3/uL (ref 0.1–1.0)
MONOS PCT: 10 % (ref 3–12)
NEUTROS ABS: 5.1 10*3/uL (ref 1.7–7.7)
Neutrophils Relative %: 59 % (ref 43–77)
Platelets: 228 10*3/uL (ref 150–400)
RBC: 5.24 MIL/uL — ABNORMAL HIGH (ref 3.87–5.11)
RDW: 12.6 % (ref 11.5–15.5)
WBC: 8.7 10*3/uL (ref 4.0–10.5)

## 2013-06-20 LAB — COMPREHENSIVE METABOLIC PANEL
ALT: 13 U/L (ref 0–35)
AST: 13 U/L (ref 0–37)
Albumin: 4 g/dL (ref 3.5–5.2)
Alkaline Phosphatase: 59 U/L (ref 39–117)
BUN: 11 mg/dL (ref 6–23)
CO2: 26 mEq/L (ref 19–32)
CREATININE: 0.74 mg/dL (ref 0.50–1.10)
Calcium: 9.6 mg/dL (ref 8.4–10.5)
Chloride: 101 mEq/L (ref 96–112)
GFR calc Af Amer: >90 mL/min (ref >90)
Glucose, Bld: 86 mg/dL (ref 70–99)
Potassium: 3.7 mEq/L (ref 3.7–5.3)
Sodium: 139 mEq/L (ref 137–147)
Total Bilirubin: 0.3 mg/dL (ref 0.3–1.2)
Total Protein: 7.6 g/dL (ref 6.0–8.3)

## 2013-06-20 LAB — URINALYSIS, ROUTINE W REFLEX MICROSCOPIC
BILIRUBIN URINE: NEGATIVE
Glucose, UA: NEGATIVE mg/dL
Hgb urine dipstick: NEGATIVE
Ketones, ur: NEGATIVE mg/dL
Leukocytes, UA: NEGATIVE
Nitrite: NEGATIVE
Protein, ur: NEGATIVE mg/dL
Specific Gravity, Urine: 1.015 (ref 1.005–1.030)
UROBILINOGEN UA: 0.2 mg/dL (ref 0.0–1.0)
pH: 6.5 (ref 5.0–8.0)

## 2013-06-20 LAB — PREGNANCY, URINE: Preg Test, Ur: NEGATIVE

## 2013-06-20 MED ORDER — SODIUM CHLORIDE 0.9 % IV BOLUS (SEPSIS)
1000.0000 mL | Freq: Once | INTRAVENOUS | Status: AC
  Administered 2013-06-20: 1000 mL via INTRAVENOUS

## 2013-06-20 NOTE — ED Notes (Signed)
Pt c/o SOB that began earlier today. Pt states any exertion makes symptoms worse. Pt also reports centralized chest pain with exertion that began today.

## 2013-06-20 NOTE — Discharge Instructions (Signed)

## 2013-06-20 NOTE — ED Provider Notes (Signed)
CSN: [id number]     Arrival date & time 06/20/13  1857 History   First MD Initiated Contact with Patient 06/20/13 1921     Chief Complaint  Patient presents with  . Shortness of Breath   (Consider location/radiation/quality/duration/timing/severity/associated sxs/prior Treatment) HPI 28 year old female who comes in today complaining of nasal congestion, sore throat, cough, nausea, subjective fever and chills for 2-3 days. She has taken clear liquids today. She denies having any diarrhea and states that she has not had a bowel movement for 3 days. She hasn't vomited multiple times and states that her throat is more sore since she had the vomiting. She states that her decreased stooling is consistent with her IBS. She has not noted any blood in her stool. She states her menstrual cycles are normal. She denies any abnormal vaginal discharge she has been pregnant twice and has one child. Past Medical History  Diagnosis Date  . Cervical abnormality   . Interstitial cystitis   . Depression   . 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 Cesarean section    . Leep    . Tubal ligation     Family History  Problem Relation Age of Onset  . Depression Mother    History  Substance Use Topics  . Smoking status: Current Some Day Smoker -- 0.50 packs/day for 10 years    Types: Cigarettes  . Smokeless tobacco: Not on file  . Alcohol Use: Yes     Comment: 1 to 2 drinks monthly   OB History   Grav Para Term Preterm Abortions TAB SAB Ect Mult Living                 Review of Systems  All other systems reviewed and are negative.    Allergies  Erythromycin; Pertussis vaccines; Reglan; Tetracyclines & related; and Latex  Home Medications   Current Outpatient Rx  Name  Route  Sig  Dispense  Refill  . nortriptyline (PAMELOR) 10 MG capsule   Oral   Take 20 mg by mouth at bedtime.         . pentosan polysulfate (ELMIRON) 100 MG capsule  Oral   Take 100 mg by mouth 3 (three) times daily before meals.         . promethazine (PHENERGAN) 25 MG tablet   Oral   Take 25 mg by mouth every 6 (six) hours as needed for nausea.         . SUMAtriptan (IMITREX) 100 MG tablet   Oral   Take 100 mg by mouth every 2 (two) hours as needed for migraine or headache. May repeat in 2 hours if headache persists or recurs.          BP 129/87  Pulse 106  Temp(Src) 97.6 F (36.4 C) (Oral)  Resp 24  SpO2 100% Physical Exam  Nursing note and vitals reviewed. Constitutional: She is oriented to person, place, and time. She appears well-developed and well-nourished.  HENT:  Head: Normocephalic and atraumatic.  Right Ear: External ear normal.  Left Ear: External ear normal.  Nose: Nose normal.  Mouth/Throat: Oropharynx is clear and moist.  Eyes: Conjunctivae and EOM are normal. Pupils are equal, round, and reactive to light.  Neck: Normal range of motion. Neck supple. No JVD present. No tracheal deviation present. No thyromegaly present.  Cardiovascular: Normal rate, regular rhythm, normal heart sounds and intact distal pulses.   Pulmonary/Chest: Effort  normal and breath sounds normal. No respiratory distress. She has no wheezes.  Abdominal: Soft. Bowel sounds are normal. She exhibits no mass. There is no tenderness. There is no guarding.  Musculoskeletal: Normal range of motion.  Lymphadenopathy:    She has no cervical adenopathy.  Neurological: She is alert and oriented to person, place, and time. She has normal reflexes. No cranial nerve deficit or sensory deficit. Gait normal. GCS eye subscore is 4. GCS verbal subscore is 5. GCS motor subscore is 6.  Reflex Scores:      Bicep reflexes are 2+ on the right side and 2+ on the left side.      Patellar reflexes are 2+ on the right side and 2+ on the left side. Strength is 5/5 bilateral elbow flexor/extensors, wrist extension/flexion, intrinsic hand strength equal Bilateral hip  flexion/extension 5/5, knee flexion/extension 5/5, ankle 5/5 flexion extension    Skin: Skin is warm and dry.  Psychiatric: She has a normal mood and affect. Her behavior is normal. Judgment and thought content normal.    ED Course  Procedures (including critical care time) Labs Review Labs Reviewed  CBC WITH DIFFERENTIAL - Abnormal; Notable for the following:    RBC 5.24 (*)    Hemoglobin 16.5 (*)    HCT 48.4 (*)    All other components within normal limits  COMPREHENSIVE METABOLIC PANEL  URINALYSIS, ROUTINE W REFLEX MICROSCOPIC  PREGNANCY, URINE   Imaging Review Dg Chest 2 View  06/20/2013   CLINICAL DATA:  Congestion, shortness of breath  EXAM: CHEST  2 VIEW  COMPARISON:  04/11/2012  FINDINGS: The heart size and mediastinal contours are within normal limits. Both lungs are clear. The visualized skeletal structures are unremarkable.  IMPRESSION: No active cardiopulmonary disease.   Electronically Signed   By: Daryll Brod M.D.   On: 06/20/2013 19:36    EKG Interpretation    Date/Time:  Wednesday June 20 2013 19:08:44 EST Ventricular Rate:  95 PR Interval:  132 QRS Duration: 82 QT Interval:  360 QTC Calculation: 452 R Axis:   63 Text Interpretation:  Sinus rhythm with marked sinus arrhythmia Possible Left atrial enlargement Borderline ECG When compared with ECG of 11-Apr-2012 11:20, No significant change was found Confirmed by Vayla Wilhelmi MD, Shravya Wickwire ([id number]) on 06/20/2013 9:06:39 PM            MDM  No diagnosis found. Patient with influenza-like illness. She does not have any acute lab abnormalities. Chest x-Shayley Medlin shows no evidence of acute infiltrate. Patient is advised of symptomatic treatment and return precautions.   Shaune Pollack, MD 06/20/13 2131

## 2013-11-12 IMAGING — CT CT ABD-PELV W/ CM
2 of 3 series · 17 of 46 positions shown, 19 images · IV contrast (Omnipaque 300)
Comparison: 12/27/2012

CLINICAL DATA: Right lower quadrant pain

CT ABDOMEN AND PELVIS WITH CONTRAST
TECHNIQUE: Multidetector CT imaging of the abdomen and pelvis was
performed following the standard protocol during bolus
administration of intravenous contrast.
Contrast: 50mL OMNIPAQUE IOHEXOL 300 MG/ML  SOLN, 100mL OMNIPAQUE
IOHEXOL 300 MG/ML  SOLN

[Series 2: abd_pel_with 5.0 b40f · axial · 0.65mm/px · z∈[+305,+705]mm · 14 of 92 slices shown, 16 images]
[im 6/92  soft-tissue]
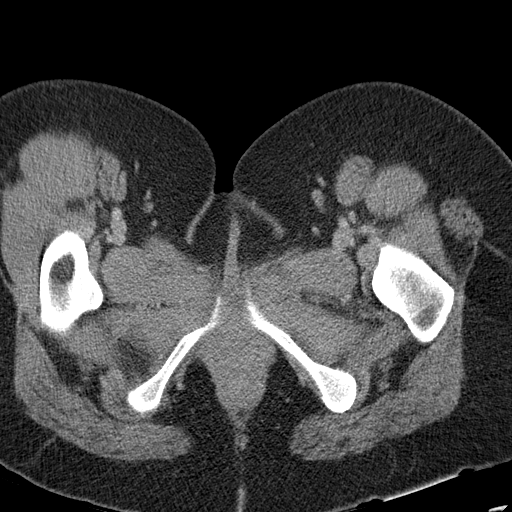
[im 6/92  bone]
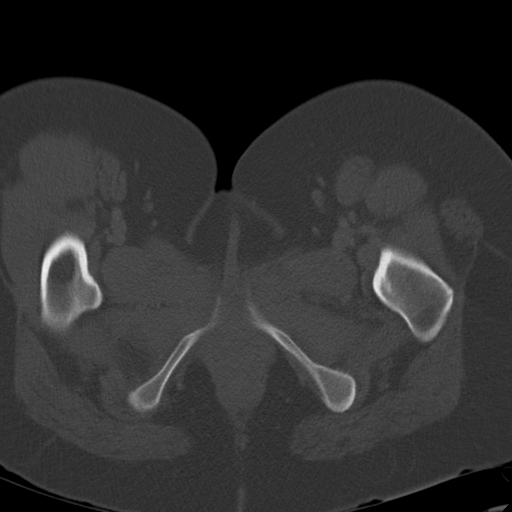
[im 12/92  soft-tissue]
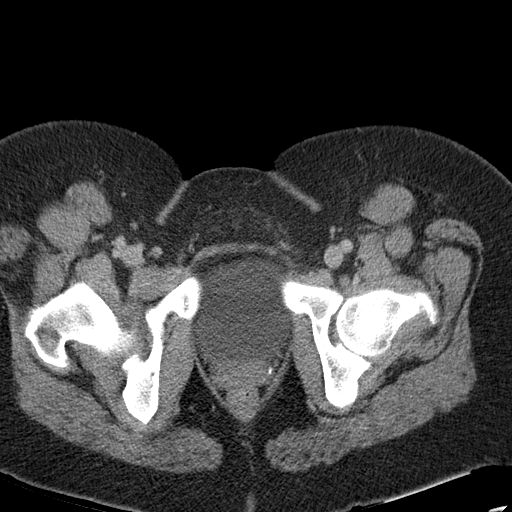
[im 18/92  soft-tissue]
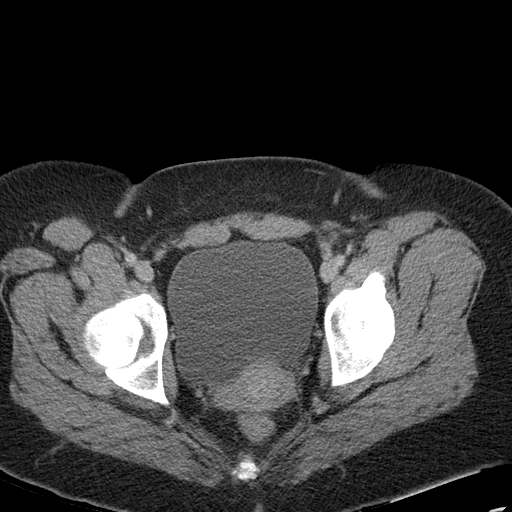
[im 24/92  soft-tissue]
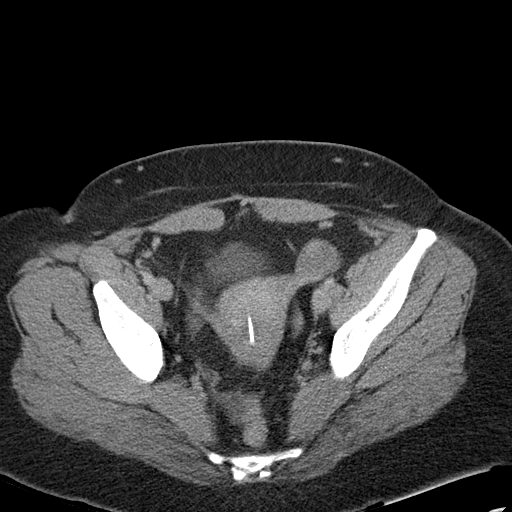
[im 30/92  soft-tissue]
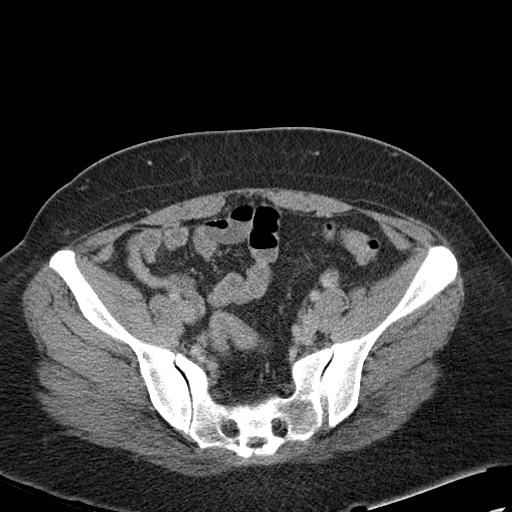
[im 36/92  soft-tissue]
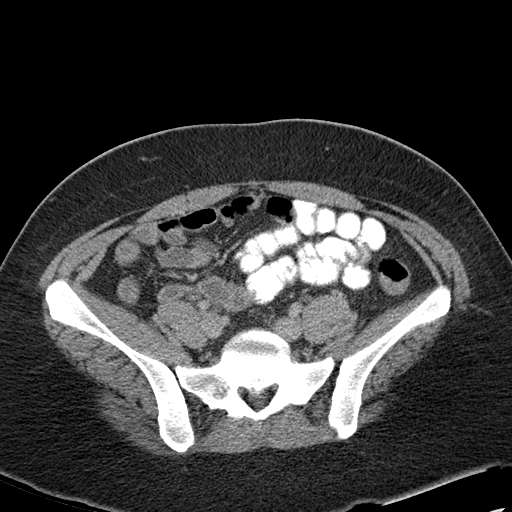
[im 42/92  soft-tissue]
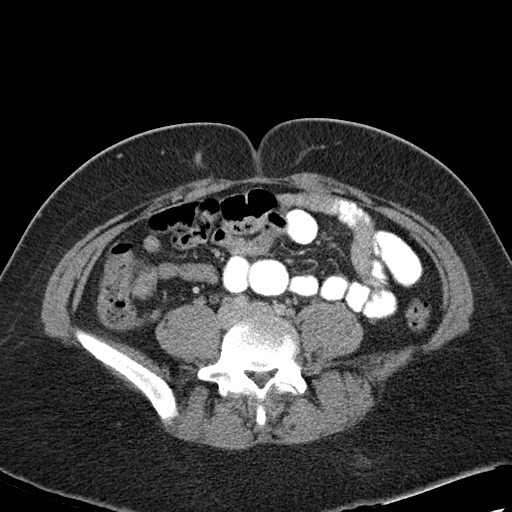
[im 50/92  soft-tissue]
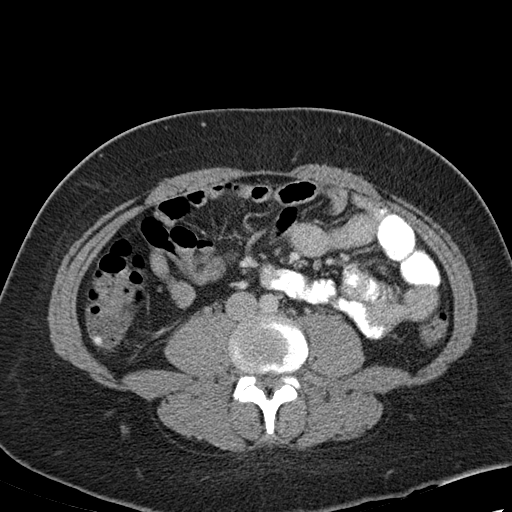
[im 56/92  soft-tissue]
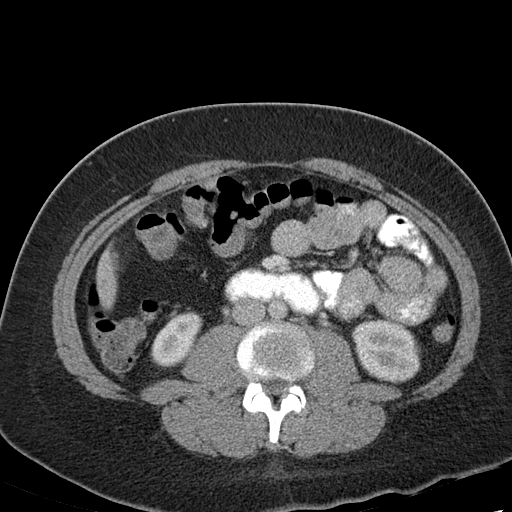
[im 56/92  bone]
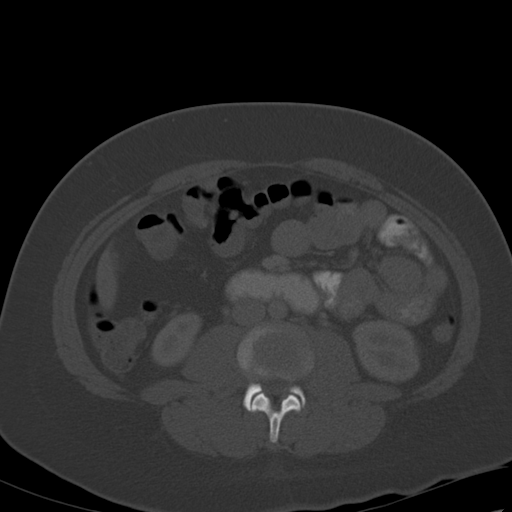
[im 62/92  soft-tissue]
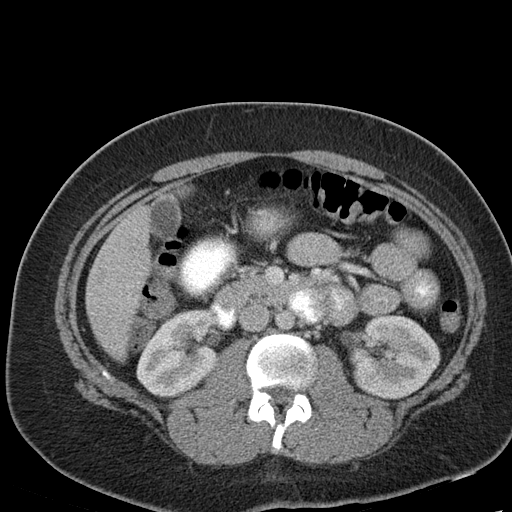
[im 68/92  soft-tissue]
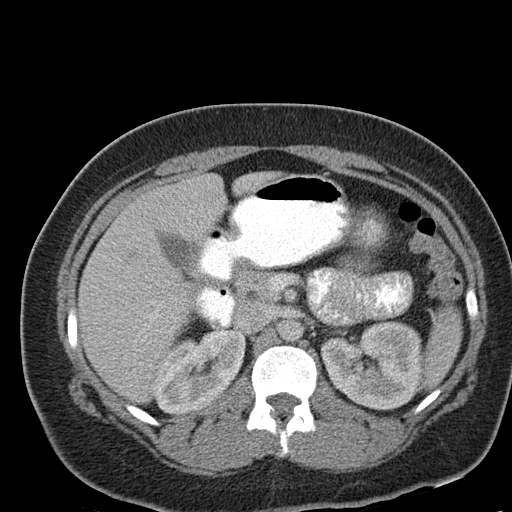
[im 74/92  soft-tissue]
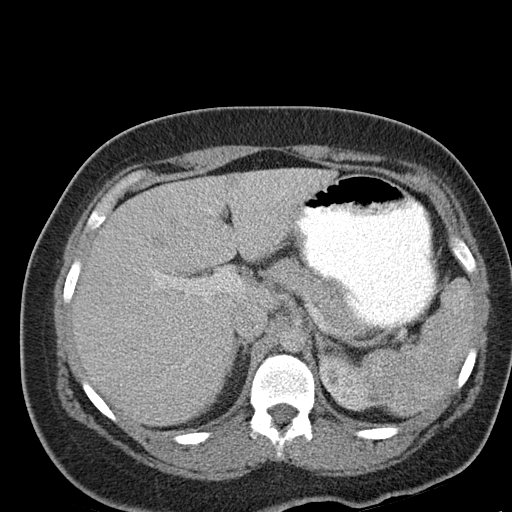
[im 80/92  soft-tissue]
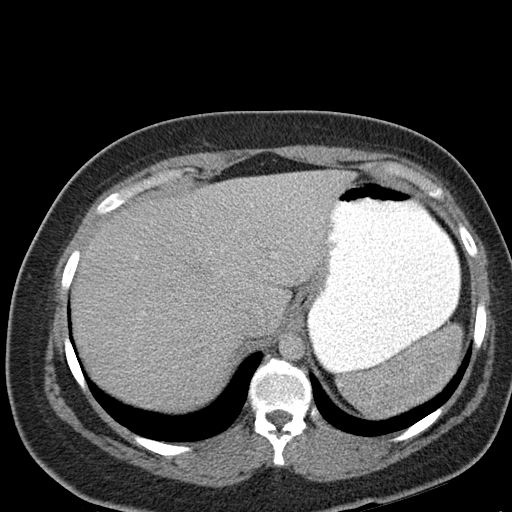
[im 86/92  soft-tissue]
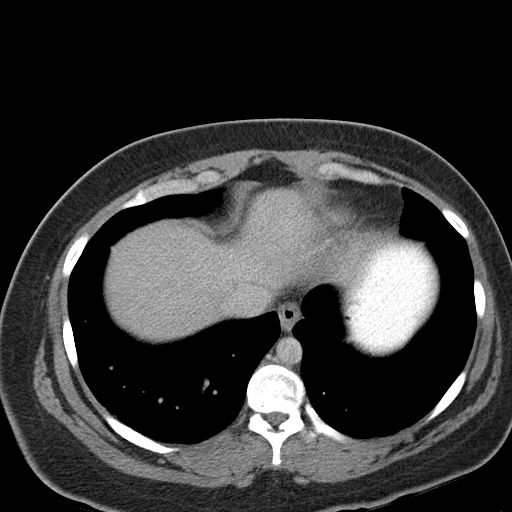

[Series 4: abd_pel_with 3.0 spo cor · coronal · 0.70mm/px · 3 of 90 slices shown]
[im 30/90  soft-tissue]
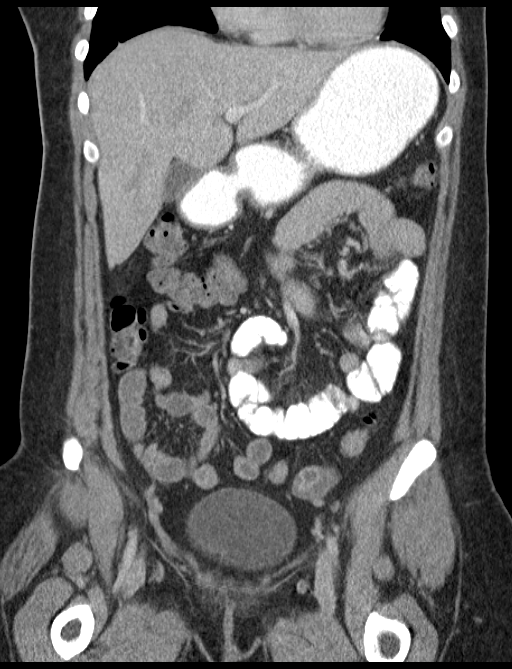
[im 40/90  soft-tissue]
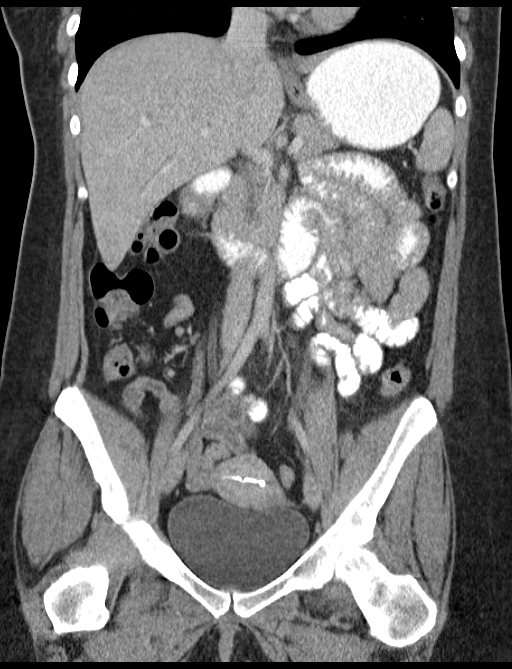
[im 50/90  soft-tissue]
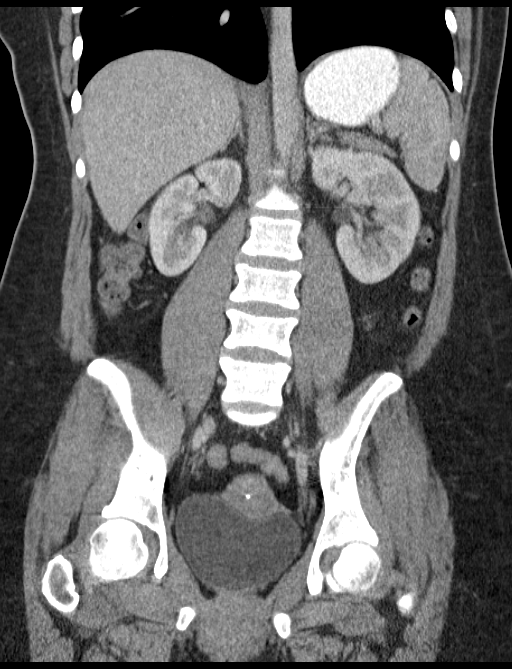

[17 of 46 positions shown; findings below may reference images not displayed]

FINDINGS: Normal appendix.

The liver, gallbladder, spleen, pancreas, kidneys, and adrenal
glands are within normal limits.

Right ovary is unremarkable.  IUD is in place within the fundal
endometrium.  Collapsing cyst in the left ovary.

Small amount of layering free fluid in the pelvis is likely
physiologic.

Unremarkable bladder.

Left sacral sclerotic lesion on image 60 has a benign appearance.
IMPRESSION: No acute pathology.

## 2014-04-11 ENCOUNTER — Emergency Department (HOSPITAL_COMMUNITY): Payer: Medicaid Other

## 2014-04-11 ENCOUNTER — Emergency Department (HOSPITAL_COMMUNITY)
Admission: EM | Admit: 2014-04-11 | Discharge: 2014-04-11 | Disposition: A | Discharge status: Home / Self Care | Payer: Medicaid Other | Attending: Emergency Medicine | Admitting: Emergency Medicine

## 2014-04-11 ENCOUNTER — Encounter (HOSPITAL_COMMUNITY): Payer: Self-pay | Admitting: Emergency Medicine

## 2014-04-11 DIAGNOSIS — Z3202 Encounter for pregnancy test, result negative: Secondary | ICD-10-CM | POA: Insufficient documentation

## 2014-04-11 DIAGNOSIS — F329 Major depressive disorder, single episode, unspecified: Secondary | ICD-10-CM | POA: Insufficient documentation

## 2014-04-11 DIAGNOSIS — R05 Cough: Secondary | ICD-10-CM

## 2014-04-11 DIAGNOSIS — Z79899 Other long term (current) drug therapy: Secondary | ICD-10-CM | POA: Diagnosis not present

## 2014-04-11 DIAGNOSIS — K5289 Other specified noninfective gastroenteritis and colitis: Secondary | ICD-10-CM | POA: Insufficient documentation

## 2014-04-11 DIAGNOSIS — K529 Noninfective gastroenteritis and colitis, unspecified: Secondary | ICD-10-CM

## 2014-04-11 DIAGNOSIS — Z856 Personal history of leukemia: Secondary | ICD-10-CM | POA: Diagnosis not present

## 2014-04-11 DIAGNOSIS — Z87448 Personal history of other diseases of urinary system: Secondary | ICD-10-CM | POA: Diagnosis not present

## 2014-04-11 DIAGNOSIS — M549 Dorsalgia, unspecified: Secondary | ICD-10-CM | POA: Diagnosis not present

## 2014-04-11 DIAGNOSIS — H539 Unspecified visual disturbance: Secondary | ICD-10-CM | POA: Insufficient documentation

## 2014-04-11 DIAGNOSIS — J40 Bronchitis, not specified as acute or chronic: Secondary | ICD-10-CM | POA: Insufficient documentation

## 2014-04-11 DIAGNOSIS — R109 Unspecified abdominal pain: Secondary | ICD-10-CM | POA: Insufficient documentation

## 2014-04-11 DIAGNOSIS — R059 Cough, unspecified: Secondary | ICD-10-CM

## 2014-04-11 DIAGNOSIS — R197 Diarrhea, unspecified: Secondary | ICD-10-CM | POA: Diagnosis present

## 2014-04-11 LAB — COMPREHENSIVE METABOLIC PANEL
ALBUMIN: 3.9 g/dL (ref 3.5–5.2)
ALT: 14 U/L (ref 0–35)
AST: 17 U/L (ref 0–37)
Alkaline Phosphatase: 75 U/L (ref 39–117)
Anion gap: 11 (ref 5–15)
BUN: 10 mg/dL (ref 6–23)
CALCIUM: 9.3 mg/dL (ref 8.4–10.5)
CO2: 29 mEq/L (ref 19–32)
Chloride: 102 mEq/L (ref 96–112)
Creatinine, Ser: 0.82 mg/dL (ref 0.50–1.10)
GFR calc Af Amer: >90 mL/min (ref >90)
Glucose, Bld: 89 mg/dL (ref 70–99)
Potassium: 3.4 mEq/L — ABNORMAL LOW (ref 3.7–5.3)
Sodium: 142 mEq/L (ref 137–147)
Total Bilirubin: 0.2 mg/dL — ABNORMAL LOW (ref 0.3–1.2)
Total Protein: 7.4 g/dL (ref 6.0–8.3)

## 2014-04-11 LAB — CBC WITH DIFFERENTIAL/PLATELET
BASOS ABS: 0.1 10*3/uL (ref 0.0–0.1)
Basophils Relative: 1 % (ref 0–1)
EOS PCT: 5 % (ref 0–5)
Eosinophils Absolute: 0.5 10*3/uL (ref 0.0–0.7)
HCT: 43.8 % (ref 36.0–46.0)
Hemoglobin: 15.1 g/dL — ABNORMAL HIGH (ref 12.0–15.0)
Lymphocytes Relative: 41 % (ref 12–46)
Lymphs Abs: 3.9 10*3/uL (ref 0.7–4.0)
MCH: 31.9 pg (ref 26.0–34.0)
MCHC: 34.5 g/dL (ref 30.0–36.0)
MCV: 92.6 fL (ref 78.0–100.0)
MONO ABS: 0.5 10*3/uL (ref 0.1–1.0)
MONOS PCT: 5 % (ref 3–12)
Neutro Abs: 4.8 10*3/uL (ref 1.7–7.7)
Neutrophils Relative %: 50 % (ref 43–77)
Platelets: 292 10*3/uL (ref 150–400)
RBC: 4.73 MIL/uL (ref 3.87–5.11)
RDW: 12.4 % (ref 11.5–15.5)
WBC: 9.7 10*3/uL (ref 4.0–10.5)

## 2014-04-11 LAB — URINALYSIS, ROUTINE W REFLEX MICROSCOPIC
Bilirubin Urine: NEGATIVE
GLUCOSE, UA: NEGATIVE mg/dL
Hgb urine dipstick: NEGATIVE
KETONES UR: NEGATIVE mg/dL
LEUKOCYTES UA: NEGATIVE
Nitrite: NEGATIVE
PH: 5.5 (ref 5.0–8.0)
PROTEIN: NEGATIVE mg/dL
Specific Gravity, Urine: 1.01 (ref 1.005–1.030)
Urobilinogen, UA: 0.2 mg/dL (ref 0.0–1.0)

## 2014-04-11 LAB — LIPASE, BLOOD: Lipase: 45 U/L (ref 11–59)

## 2014-04-11 LAB — PREGNANCY, URINE: Preg Test, Ur: NEGATIVE

## 2014-04-11 MED ORDER — ONDANSETRON HCL 4 MG/2ML IJ SOLN
4.0000 mg | Freq: Once | INTRAMUSCULAR | Status: AC
  Administered 2014-04-11: 4 mg via INTRAVENOUS
  Filled 2014-04-11: qty 2

## 2014-04-11 MED ORDER — ACETAMINOPHEN 500 MG PO TABS
1000.0000 mg | ORAL_TABLET | Freq: Once | ORAL | Status: AC
  Administered 2014-04-11: 1000 mg via ORAL
  Filled 2014-04-11: qty 2

## 2014-04-11 MED ORDER — IOHEXOL 300 MG/ML  SOLN
100.0000 mL | Freq: Once | INTRAMUSCULAR | Status: AC | PRN
  Administered 2014-04-11: 100 mL via INTRAVENOUS

## 2014-04-11 MED ORDER — DM-GUAIFENESIN ER 30-600 MG PO TB12: 1.0000 | ORAL_TABLET | Freq: Two times a day (BID) | ORAL | Status: DC

## 2014-04-11 MED ORDER — PROMETHAZINE HCL 25 MG PO TABS: 25.0000 mg | ORAL_TABLET | Freq: Four times a day (QID) | ORAL | Status: DC | PRN

## 2014-04-11 MED ORDER — IOHEXOL 300 MG/ML  SOLN
50.0000 mL | Freq: Once | INTRAMUSCULAR | Status: AC | PRN
  Administered 2014-04-11: 50 mL via ORAL

## 2014-04-11 MED ORDER — LOPERAMIDE HCL 2 MG PO TABS: 2.0000 mg | ORAL_TABLET | Freq: Four times a day (QID) | ORAL | Status: DC | PRN

## 2014-04-11 MED ORDER — HYDROMORPHONE HCL 1 MG/ML IJ SOLN
1.0000 mg | Freq: Once | INTRAMUSCULAR | Status: AC
  Administered 2014-04-11: 1 mg via INTRAVENOUS
  Filled 2014-04-11: qty 1

## 2014-04-11 NOTE — ED Provider Notes (Signed)
CSN: [id number]     Arrival date & time 04/11/14  1404 History  This chart was scribed for Bethany Sorrow, MD by Rayfield Citizen, ED Scribe. This patient was seen in room APA07/APA07 and the patient's care was started at 5:43 PM.    Chief Complaint  Patient presents with  . Diarrhea  . Fever   Patient is a 28 y.o. female presenting with diarrhea and fever. The history is provided by the patient. No language interpreter was used.  Diarrhea Quality:  Unable to specify Severity:  Unable to specify Onset quality:  Gradual Duration:  1 month Timing:  Intermittent Progression:  Unable to specify Relieved by:  None tried Worsened by:  Nothing tried Ineffective treatments:  None tried Associated symptoms: cough, fever, headaches, myalgias and vomiting   Associated symptoms: no abdominal pain (pt reports chronic abdominal pain) and no chills   Fever Associated symptoms: chest pain, congestion, cough, diarrhea, dysuria, headaches, myalgias, nausea, rhinorrhea, sore throat and vomiting   Associated symptoms: no chills, no confusion and no rash      HPI Comments: Bethany Little is a 28 y.o. female who presents to the Emergency Department complaining of 1 month of URI symptoms; she reports productive cough and chest pain for the past week.   She also reports chronic abdominal pain, with 1 month of diarrhea. She states that she has decreased appetite and only eats once a day or so; she has vomiting.   She has had a fever.   She also notes yellowing of her skin.   PCP is Dr. Gilford Rile.   Past Medical History  Diagnosis Date  . Cervical abnormality   . Interstitial cystitis   . Depression   . 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 Cesarean section    . Leep    . Tubal ligation     Family History  Problem Relation Age of Onset  . Depression Mother    History  Substance Use Topics  . Smoking status: Current Some Day Smoker -- 0.50  packs/day for 10 years    Types: Cigarettes  . Smokeless tobacco: Not on file  . Alcohol Use: Yes     Comment: 1 to 2 drinks monthly   OB History    No data available     Review of Systems  Constitutional: Positive for fever. Negative for chills.  HENT: Positive for congestion, rhinorrhea and sore throat.   Eyes: Positive for visual disturbance (pt reports an incidence of blurred vision).  Respiratory: Positive for cough and shortness of breath.   Cardiovascular: Positive for chest pain and leg swelling.  Gastrointestinal: Positive for nausea, vomiting and diarrhea. Negative for abdominal pain (pt reports chronic abdominal pain).  Genitourinary: Positive for dysuria. Negative for frequency and hematuria.  Musculoskeletal: Positive for myalgias and back pain.  Skin: Negative for rash.  Neurological: Positive for headaches.  Hematological: Does not bruise/bleed easily.  Psychiatric/Behavioral: Negative for confusion.      Allergies  Erythromycin; Pertussis vaccines; Reglan; Tetracyclines & related; and Latex  Home Medications   Prior to Admission medications   Medication Sig Start Date End Date Taking? Authorizing Provider  dextroamphetamine (DEXEDRINE) 10 MG tablet Take 5 mg by mouth 2 (two) times daily.   Yes Historical Provider, MD  Lurasidone HCl (LATUDA) 20 MG TABS Take 1 tablet by mouth at bedtime.   Yes Historical Provider, MD  nortriptyline (PAMELOR) 10 MG  capsule Take 40 mg by mouth at bedtime.    Yes Historical Provider, MD  PE-DM-APAP & Doxylamin-DM-APAP (VICKS DAYQUIL/NYQUIL CLD & FLU) (LIQUID) MISC Take 15-30 mLs by mouth daily as needed (for cold symptoms).   Yes Historical Provider, MD  pentosan polysulfate (ELMIRON) 100 MG capsule Take 100 mg by mouth at bedtime as needed.    Yes Historical Provider, MD  Phenylephrine-APAP-Guaifenesin ([employer] COLD & SINUS MULTI-SYMPTOM PO) Take 1-2 tablets by mouth daily as needed (for sinus and cold symptoms).   Yes Historical  Provider, MD  SUMAtriptan (IMITREX) 100 MG tablet Take 100 mg by mouth every 2 (two) hours as needed for migraine or headache. May repeat in 2 hours if headache persists or recurs.   Yes Historical Provider, MD  dextromethorphan-guaiFENesin (MUCINEX DM) 30-600 MG per 12 hr tablet Take 1 tablet by mouth 2 (two) times daily. 04/11/14   Bethany Sorrow, MD  loperamide (IMODIUM A-D) 2 MG tablet Take 1 tablet (2 mg total) by mouth 4 (four) times daily as needed for diarrhea or loose stools. 04/11/14   Bethany Sorrow, MD  promethazine (PHENERGAN) 25 MG tablet Take 1 tablet (25 mg total) by mouth every 6 (six) hours as needed. 04/11/14   Bethany Sorrow, MD   BP 137/99 mmHg  Pulse 84  Temp(Src) 98.4 F (36.9 C) (Oral)  Resp 18  Ht 5\' 4"  (1.626 m)  Wt 182 lb (82.555 kg)  BMI 31.22 kg/m2  SpO2 100% Physical Exam  Constitutional: She is oriented to person, place, and time. She appears well-developed and well-nourished.  HENT:  Head: Normocephalic and atraumatic.  Neck: No tracheal deviation present.  Cardiovascular: Normal rate, regular rhythm and normal heart sounds.  Exam reveals no gallop and no friction rub.   No murmur heard. Pulmonary/Chest: Effort normal and breath sounds normal. She has no wheezes. She has no rales.  Abdominal: Soft. Bowel sounds are normal. She exhibits no distension. There is no tenderness.  Musculoskeletal: She exhibits no edema.  Neurological: She is alert and oriented to person, place, and time.  Skin: Skin is warm and dry.  Psychiatric: She has a normal mood and affect. Her behavior is normal.  Nursing note and vitals reviewed.   ED Course  Procedures   DIAGNOSTIC STUDIES: Oxygen Saturation is 99% on RA, normal by my interpretation.    COORDINATION OF CARE: 5:49 PM Discussed treatment plan with pt at bedside and pt agreed to plan.   Labs Review Labs Reviewed  CBC WITH DIFFERENTIAL - Abnormal; Notable for the following:    Hemoglobin 15.1 (*)    All  other components within normal limits  COMPREHENSIVE METABOLIC PANEL - Abnormal; Notable for the following:    Potassium 3.4 (*)    Total Bilirubin 0.2 (*)    All other components within normal limits  URINALYSIS, ROUTINE W REFLEX MICROSCOPIC  LIPASE, BLOOD  PREGNANCY, URINE   Results for orders placed or performed during the hospital encounter of 04/11/14  CBC with Differential  Result Value Ref Range   WBC 9.7 4.0 - 10.5 K/uL   RBC 4.73 3.87 - 5.11 MIL/uL   Hemoglobin 15.1 (H) 12.0 - 15.0 g/dL   HCT 43.8 36.0 - 46.0 %   MCV 92.6 78.0 - 100.0 fL   MCH 31.9 26.0 - 34.0 pg   MCHC 34.5 30.0 - 36.0 g/dL   RDW 12.4 11.5 - 15.5 %   Platelets 292 150 - 400 K/uL   Neutrophils Relative % 50 43 - 77 %  Neutro Abs 4.8 1.7 - 7.7 K/uL   Lymphocytes Relative 41 12 - 46 %   Lymphs Abs 3.9 0.7 - 4.0 K/uL   Monocytes Relative 5 3 - 12 %   Monocytes Absolute 0.5 0.1 - 1.0 K/uL   Eosinophils Relative 5 0 - 5 %   Eosinophils Absolute 0.5 0.0 - 0.7 K/uL   Basophils Relative 1 0 - 1 %   Basophils Absolute 0.1 0.0 - 0.1 K/uL  Comprehensive metabolic panel  Result Value Ref Range   Sodium 142 137 - 147 mEq/L   Potassium 3.4 (L) 3.7 - 5.3 mEq/L   Chloride 102 96 - 112 mEq/L   CO2 29 19 - 32 mEq/L   Glucose, Bld 89 70 - 99 mg/dL   BUN 10 6 - 23 mg/dL   Creatinine, Ser 0.82 0.50 - 1.10 mg/dL   Calcium 9.3 8.4 - 10.5 mg/dL   Total Protein 7.4 6.0 - 8.3 g/dL   Albumin 3.9 3.5 - 5.2 g/dL   AST 17 0 - 37 U/L   ALT 14 0 - 35 U/L   Alkaline Phosphatase 75 39 - 117 U/L   Total Bilirubin 0.2 (L) 0.3 - 1.2 mg/dL   GFR calc non Af Amer >90 >90 mL/min   GFR calc Af Amer >90 >90 mL/min   Anion gap 11 5 - 15  Urinalysis, Routine w reflex microscopic  Result Value Ref Range   Color, Urine YELLOW YELLOW   APPearance CLEAR CLEAR   Specific Gravity, Urine 1.010 1.005 - 1.030   pH 5.5 5.0 - 8.0   Glucose, UA NEGATIVE NEGATIVE mg/dL   Hgb urine dipstick NEGATIVE NEGATIVE   Bilirubin Urine NEGATIVE  NEGATIVE   Ketones, ur NEGATIVE NEGATIVE mg/dL   Protein, ur NEGATIVE NEGATIVE mg/dL   Urobilinogen, UA 0.2 0.0 - 1.0 mg/dL   Nitrite NEGATIVE NEGATIVE   Leukocytes, UA NEGATIVE NEGATIVE  Lipase, blood  Result Value Ref Range   Lipase 45 11 - 59 U/L  Pregnancy, urine  Result Value Ref Range   Preg Test, Ur NEGATIVE NEGATIVE     Imaging Review Dg Chest 2 View  04/11/2014   CLINICAL DATA:  Productive cough and fever  EXAM: CHEST  2 VIEW  COMPARISON:  06/20/2013  FINDINGS: The heart size and mediastinal contours are within normal limits. Both lungs are clear. The visualized skeletal structures are unremarkable.  IMPRESSION: No active cardiopulmonary disease.   Electronically Signed   By: Inez Catalina M.D.   On: 04/11/2014 19:14   Ct Abdomen Pelvis W Contrast  04/11/2014   CLINICAL DATA:  Nausea vomiting and diarrhea  EXAM: CT ABDOMEN AND PELVIS WITH CONTRAST  TECHNIQUE: Multidetector CT imaging of the abdomen and pelvis was performed using the standard protocol following bolus administration of intravenous contrast.  CONTRAST:  64mL OMNIPAQUE IOHEXOL 300 MG/ML SOLN, 138mL OMNIPAQUE IOHEXOL 300 MG/ML SOLN  COMPARISON:  12/31/2012  FINDINGS: The lung bases are free of acute infiltrate or sizable effusion.  The liver, gallbladder, spleen, adrenal glands and pancreas are all normal in their CT appearance. The kidneys are well visualized bilaterally. No renal calculi or obstructive changes are seen. The appendix is well visualized and within normal limits. No obstructive changes are seen. The bladder is well distended. No pelvic mass lesion is. Mild diverticular change is noted without diverticulitis. The osseous structures show no acute abnormality. Sclerotic focus is noted in the sacrum on the left but stable from the prior exam. The previously seen  IUD is been removed.  IMPRESSION: No acute abnormality is identified.   Electronically Signed   By: Inez Catalina M.D.   On: 04/11/2014 19:19     EKG  Interpretation None      MDM   Final diagnoses:  Cough  Abdominal pain  Gastroenteritis  Bronchitis   Patient with extensive workup chest x-ray negative for pneumonia. CT abdomen pelvis negative for any significant intra-abdominal findings. Symptoms most likely viral in nature. Starting an upper respiratory infection and then now now more of a gastroenteritis. Patient's labs without any significant abnormalities. No leukocytosis no liver function test abnormalities. No evidence urinary tract infection. Patency test negative. Will treat symptomatically.  I personally performed the services described in this documentation, which was scribed in my presence. The recorded information has been reviewed and is accurate.       Bethany Sorrow, MD 04/11/14 2109

## 2014-04-11 NOTE — ED Notes (Signed)
PT c/o URI symptoms x1 month with chronic diarrhea and general malaise.

## 2014-04-11 NOTE — Discharge Instructions (Signed)
Take medications as directed. Symptoms seem to be consistent with a viral illness. No evidence of pneumonia. No intra-abdominal problems. Follow-up with your doctor in the next few days. Return for any new or worse symptoms. Work note provided.

## 2014-04-11 NOTE — ED Notes (Signed)
Pt states she has been sick for past month with ongoing diarrhea, nausea & vomiting and color changes to her nails and skin.

## 2014-04-11 NOTE — ED Notes (Signed)
Pt also c/o cough for past month with chest pain and palpitations.

## 2014-07-01 ENCOUNTER — Emergency Department: Payer: Self-pay | Admitting: Student

## 2014-07-01 LAB — URINALYSIS, COMPLETE
BILIRUBIN, UR: NEGATIVE
Blood: NEGATIVE
GLUCOSE, UR: NEGATIVE mg/dL (ref 0–75)
Ketone: NEGATIVE
LEUKOCYTE ESTERASE: NEGATIVE
NITRITE: NEGATIVE
Ph: 6 (ref 4.5–8.0)
Protein: NEGATIVE
Specific Gravity: 1.001 (ref 1.003–1.030)
WBC UR: <1 /HPF (ref 0–5)

## 2014-07-01 LAB — COMPREHENSIVE METABOLIC PANEL
ALBUMIN: 3.3 g/dL — AB (ref 3.4–5.0)
Alkaline Phosphatase: 59 U/L (ref 46–116)
Anion Gap: 5 — ABNORMAL LOW (ref 7–16)
BUN: 8 mg/dL (ref 7–18)
Bilirubin,Total: 0.3 mg/dL (ref 0.2–1.0)
CHLORIDE: 108 mmol/L — AB (ref 98–107)
CO2: 29 mmol/L (ref 21–32)
Calcium, Total: 8.4 mg/dL — ABNORMAL LOW (ref 8.5–10.1)
Creatinine: 0.83 mg/dL (ref 0.60–1.30)
Glucose: 74 mg/dL (ref 65–99)
OSMOLALITY: 280 (ref 275–301)
POTASSIUM: 3.3 mmol/L — AB (ref 3.5–5.1)
SGOT(AST): 19 U/L (ref 15–37)
SGPT (ALT): 20 U/L (ref 14–63)
SODIUM: 142 mmol/L (ref 136–145)
Total Protein: 6.8 g/dL (ref 6.4–8.2)

## 2014-07-01 LAB — DRUG SCREEN, URINE
Amphetamines, Ur Screen: NEGATIVE (ref ?–1000)
Barbiturates, Ur Screen: NEGATIVE (ref ?–200)
Benzodiazepine, Ur Scrn: NEGATIVE (ref ?–200)
Cannabinoid 50 Ng, Ur ~~LOC~~: POSITIVE (ref ?–50)
Cocaine Metabolite,Ur ~~LOC~~: NEGATIVE (ref ?–300)
MDMA (Ecstasy)Ur Screen: NEGATIVE (ref ?–500)
METHADONE, UR SCREEN: NEGATIVE (ref ?–300)
Opiate, Ur Screen: NEGATIVE (ref ?–300)
Phencyclidine (PCP) Ur S: NEGATIVE (ref ?–25)
Tricyclic, Ur Screen: NEGATIVE (ref ?–1000)

## 2014-07-01 LAB — HCG, QUANTITATIVE, PREGNANCY: Beta Hcg, Quant.: <1 m[IU]/mL — ABNORMAL LOW

## 2014-07-01 LAB — CBC
HCT: 39.7 % (ref 35.0–47.0)
HGB: 13.4 g/dL (ref 12.0–16.0)
MCH: 31.3 pg (ref 26.0–34.0)
MCHC: 33.7 g/dL (ref 32.0–36.0)
MCV: 93 fL (ref 80–100)
PLATELETS: 254 10*3/uL (ref 150–440)
RBC: 4.28 10*6/uL (ref 3.80–5.20)
RDW: 12.9 % (ref 11.5–14.5)
WBC: 9.2 10*3/uL (ref 3.6–11.0)

## 2014-07-01 LAB — TROPONIN I

## 2014-07-01 LAB — ETHANOL

## 2014-07-01 LAB — CK TOTAL AND CKMB (NOT AT ARMC)
CK, Total: 104 U/L (ref 26–192)
CK-MB: 0.6 ng/mL (ref 0.5–3.6)

## 2014-07-01 LAB — D-DIMER(ARMC): D-Dimer: 213 ng/ml

## 2014-09-13 NOTE — Op Note (Signed)
PATIENT NAME:  Bethany Little, Bethany Little MR#:  [id number] DATE OF BIRTH:  May 27, 1985  DATE OF PROCEDURE:  01/19/2013  PREOPERATIVE DIAGNOSIS: Desires sterilization.   POSTOPERATIVE DIAGNOSIS:  Desires sterilization.  PROCEDURE PERFORMED:  Laparoscopic tubal cautery.   SURGEON: Ricky L. Amalia Hailey, MD   ANESTHESIA: General.   FINDINGS: Grossly normal uterus, ovaries, tubes, small follicular cyst of the left ovary left in situ.   COMPLICATIONS: None.   ANESTHESIA:  Local anesthesia 10 mL of 0.5% Sensorcaine.   DRAINS: In and out cath at the beginning of the procedure, approximately 100 mL.   PROCEDURE IN DETAIL: The patient consented. Again, stated desire for permanent sterilization. Taken to the operating room and placed in the supine position where anesthesia was initiated and then prepped and draped in the usual sterile fashion. The cervix was visualized with bivalve speculum. Hulka tenaculum was placed. The bladder was drained, and we turned our attention to the abdomen.   After infiltrating the infraumbilical region with 10 mL of 0.5% Sensorcaine, a #15 blade was used to make umbilical incision through which a 10 mm port was placed and pneumoperitoneum was established with findings as noted above, and proceeded with laparoscopic tubal cautery using Kleppingers in the mid ampullary region of bilateral fallopian tubes in the usual fashion. Pictures were taken for and after.   The areas were seen to be hemostatic. Pneumoperitoneum was allowed to resolve. Ports removed. Incisions were closed with deep of 0 Vicryl and Band-Aid was placed.   All instrument, needle and sponge counts were correct. Hulka tenaculum was removed. The patient tolerated the procedure well. I anticipate a routine postoperative course.    ____________________________ Rockey Situ. Amalia Hailey, MD rle:dmm D: 01/19/2013 09:33:51 ET T: 01/19/2013 10:08:30 ET JOB#: [id number]  cc: Ricky L. Amalia Hailey, MD, <Dictator> [place]  MD ELECTRONICALLY SIGNED 01/23/2013 9:15

## 2014-11-01 ENCOUNTER — Encounter: Payer: Self-pay | Admitting: Emergency Medicine

## 2014-11-01 ENCOUNTER — Emergency Department: Payer: Medicaid Other

## 2014-11-01 ENCOUNTER — Emergency Department
Admission: EM | Admit: 2014-11-01 | Discharge: 2014-11-01 | Disposition: A | Discharge status: Home / Self Care | Payer: Medicaid Other | Attending: Emergency Medicine | Admitting: Emergency Medicine

## 2014-11-01 DIAGNOSIS — Z720 Tobacco use: Secondary | ICD-10-CM | POA: Insufficient documentation

## 2014-11-01 DIAGNOSIS — Z79899 Other long term (current) drug therapy: Secondary | ICD-10-CM | POA: Diagnosis not present

## 2014-11-01 DIAGNOSIS — S199XXA Unspecified injury of neck, initial encounter: Secondary | ICD-10-CM | POA: Insufficient documentation

## 2014-11-01 DIAGNOSIS — Y9389 Activity, other specified: Secondary | ICD-10-CM | POA: Diagnosis not present

## 2014-11-01 DIAGNOSIS — S0990XA Unspecified injury of head, initial encounter: Secondary | ICD-10-CM | POA: Insufficient documentation

## 2014-11-01 DIAGNOSIS — Z91040 Latex allergy status: Secondary | ICD-10-CM | POA: Diagnosis not present

## 2014-11-01 DIAGNOSIS — S40021A Contusion of right upper arm, initial encounter: Secondary | ICD-10-CM | POA: Diagnosis not present

## 2014-11-01 DIAGNOSIS — S59901A Unspecified injury of right elbow, initial encounter: Secondary | ICD-10-CM | POA: Diagnosis not present

## 2014-11-01 DIAGNOSIS — Y998 Other external cause status: Secondary | ICD-10-CM | POA: Diagnosis not present

## 2014-11-01 DIAGNOSIS — Y9289 Other specified places as the place of occurrence of the external cause: Secondary | ICD-10-CM | POA: Insufficient documentation

## 2014-11-01 DIAGNOSIS — F445 Conversion disorder with seizures or convulsions: Secondary | ICD-10-CM

## 2014-11-01 DIAGNOSIS — W01198A Fall on same level from slipping, tripping and stumbling with subsequent striking against other object, initial encounter: Secondary | ICD-10-CM | POA: Diagnosis not present

## 2014-11-01 DIAGNOSIS — T07XXXA Unspecified multiple injuries, initial encounter: Secondary | ICD-10-CM

## 2014-11-01 DIAGNOSIS — R569 Unspecified convulsions: Secondary | ICD-10-CM | POA: Insufficient documentation

## 2014-11-01 MED ORDER — HYDROCODONE-ACETAMINOPHEN 5-325 MG PO TABS
2.0000 | ORAL_TABLET | Freq: Once | ORAL | Status: AC
  Administered 2014-11-01: 2 via ORAL

## 2014-11-01 MED ORDER — HYDROCODONE-ACETAMINOPHEN 5-325 MG PO TABS: 1.0000 | ORAL_TABLET | ORAL | Status: DC | PRN

## 2014-11-01 MED ORDER — HYDROCODONE-ACETAMINOPHEN 5-325 MG PO TABS
ORAL_TABLET | ORAL | Status: AC
  Administered 2014-11-01: 2 via ORAL
  Filled 2014-11-01: qty 2

## 2014-11-01 NOTE — ED Notes (Signed)
Cervical collar placed.  Case discussed with Gerald Stabs, [place], head and neck ct ordered per his request

## 2014-11-01 NOTE — ED Notes (Signed)
Pt reports having "siezure like" activity everyday since march. Pt seen at [facility] and no dx of sizures pt has appointment for EEG June 21st. Pt denies stress in her life. Reports after these events being confused and tired. Pt reports severe leg pain after most "attacks". Pt hit back of head last night and unsure of hitting right arm but reports pain in right arm and shoulder. Opt reports pain when turning head to the right. Pt alert and oriented at this time.

## 2014-11-01 NOTE — ED Provider Notes (Signed)
[facility] Emergency Department Provider Note  Time seen: 9:57 PM  I have reviewed the triage vital signs and the nursing notes.   HISTORY  Chief Complaint Arm Pain; Neck Pain; and Head Injury    HPI Bethany Little is a 29 y.o. female with a past medical history of depression, presents after a fall for right arm pain, and headache. According to the patient she was having a "spell", which she describes as seizure-like activity. She showed me a video on her husband's phone of the seizure-like activity. Inability of the patient is shaking all extremities but appears to be making purposeful movements. She states yesterday she was walking and had a spell falling to the ground hurting her right arm hitting her head, since she has had continued right arm pain as well as headaches that she came to the emergency department for evaluation. The patient is currently getting a workup by neurology and has an EEG scheduled in 2 weeks. They've told her they do not believe that she is having epileptic seizures, but they are not sure what is causing her event. Patient describes her pain as dull, aching, moderate in severity.    Past Medical History  Diagnosis Date  . Cervical abnormality   . Interstitial cystitis   . Depression   . Leukemia     Remission for 23 yrs    Patient Active Problem List   Diagnosis Date Noted  . Elevated blood pressure 04/06/2012  . Leukemia 04/04/2012  . Suicidal intent 04/03/2012  . Amitriptyline overdose 04/03/2012  . Depression 04/03/2012    Past Surgical History  Procedure Laterality Date  . Cesarean section    . Leep    . Tubal ligation      Current Outpatient Rx  Name  Route  Sig  Dispense  Refill  . dextroamphetamine (DEXEDRINE) 10 MG tablet   Oral   Take 5 mg by mouth 2 (two) times daily.         Marland Kitchen dextromethorphan-guaiFENesin (MUCINEX DM) 30-600 MG per 12 hr tablet   Oral   Take 1 tablet by mouth 2 (two) times  daily.   14 tablet   0   . loperamide (IMODIUM A-D) 2 MG tablet   Oral   Take 1 tablet (2 mg total) by mouth 4 (four) times daily as needed for diarrhea or loose stools.   30 tablet   0   . Lurasidone HCl (LATUDA) 20 MG TABS   Oral   Take 1 tablet by mouth at bedtime.         . nortriptyline (PAMELOR) 10 MG capsule   Oral   Take 40 mg by mouth at bedtime.          Marland Kitchen PE-DM-APAP & Doxylamin-DM-APAP (VICKS DAYQUIL/NYQUIL CLD & FLU) (LIQUID) MISC   Oral   Take 15-30 mLs by mouth daily as needed (for cold symptoms).         . pentosan polysulfate (ELMIRON) 100 MG capsule   Oral   Take 100 mg by mouth at bedtime as needed.          Marland Kitchen Phenylephrine-APAP-Guaifenesin ([employer] COLD & SINUS MULTI-SYMPTOM PO)   Oral   Take 1-2 tablets by mouth daily as needed (for sinus and cold symptoms).         . promethazine (PHENERGAN) 25 MG tablet   Oral   Take 1 tablet (25 mg total) by mouth every 6 (six) hours as needed.   12 tablet  0   . SUMAtriptan (IMITREX) 100 MG tablet   Oral   Take 100 mg by mouth every 2 (two) hours as needed for migraine or headache. May repeat in 2 hours if headache persists or recurs.           Allergies Erythromycin; Pertussis vaccines; Reglan; Tetracyclines & related; and Latex  Family History  Problem Relation Age of Onset  . Depression Mother     Social History History  Substance Use Topics  . Smoking status: Current Some Day Smoker -- 1.00 packs/day for 10 years    Types: Cigarettes  . Smokeless tobacco: Not on file  . Alcohol Use: Yes     Comment: 1 to 2 drinks monthly    Review of Systems Constitutional: Negative for fever. Cardiovascular: Negative for chest pain. Respiratory: Negative for shortness of breath. Gastrointestinal: Negative for abdominal pain, vomiting and diarrhea. Musculoskeletal: Positive for right arm pain, elbow pain, and neck pain. Neurological: As it for headache, negative for focal weakness or  numbness. 10-point ROS otherwise negative.  ____________________________________________   PHYSICAL EXAM:  VITAL SIGNS: ED Triage Vitals  Enc Vitals Group     BP 11/01/14 1813 129/80 mmHg     Pulse Rate 11/01/14 1813 99     Resp 11/01/14 1813 16     Temp 11/01/14 1813 98.8 F (37.1 C)     Temp Source 11/01/14 1813 Oral     SpO2 11/01/14 1813 98 %     Weight 11/01/14 1813 184 lb (83.462 kg)     Height 11/01/14 1813 5\' 4"  (1.626 m)     Head Cir --      Peak Flow --      Pain Score 11/01/14 1825 8     Pain Loc --      Pain Edu? --      Excl. in [place]? --     Constitutional: Alert and oriented. Well appearing and in no distress. Eyes: Normal exam ENT   Head: Normocephalic and atraumatic   Mouth/Throat: Mucous membranes are moist. Cardiovascular: Normal rate, regular rhythm. No murmur Respiratory: Normal respiratory effort without tachypnea nor retractions. Breath sounds are clear  Gastrointestinal: Soft and nontender. No distention.   Musculoskeletal: Moderate right arm tenderness to palpation, normal range of motion, neurovascularly intact. Mild cervical/final tenderness to palpation. Mild ecchymosis to right upper arm. Neurologic:  Normal speech and language. No gross focal neurologic deficits Skin:  Skin is warm, dry and intact.  Psychiatric: Mood and affect are normal. Speech and behavior are normal.     RADIOLOGY  X-rays are within normal limits. CTs are within normal limits.  ____________________________________________   INITIAL IMPRESSION / ASSESSMENT AND PLAN / ED COURSE  Pertinent labs & imaging results that were available during my care of the patient were reviewed by me and considered in my medical decision making (see chart for details).  Patient presents with what appears to be nonepileptic seizure-like activity. Imaging today is normal. Denies any events today, her pain is from her fall yesterday. We will place the patient on a short course of  Norco, and have her follow up with her neurologist. I discussed very strict return precautions with the patient, as well as not drinking alcohol or taking Klonopin while taking Norco. Patient agrees to this. We will discharge patient home at this time with follow-up with neurology.  ____________________________________________   FINAL CLINICAL IMPRESSION(S) / ED DIAGNOSES  Fall Contusions   Harvest Dark, MD 11/01/14 2202

## 2014-11-01 NOTE — Discharge Instructions (Signed)
Contusion A contusion is a deep bruise. Contusions are the result of an injury that caused bleeding under the skin. The contusion may turn blue, purple, or yellow. Minor injuries will give you a painless contusion, but more severe contusions may stay painful and swollen for a few weeks.  CAUSES  A contusion is usually caused by a blow, trauma, or direct force to an area of the body. SYMPTOMS   Swelling and redness of the injured area.  Bruising of the injured area.  Tenderness and soreness of the injured area.  Pain. DIAGNOSIS  The diagnosis can be made by taking a history and physical exam. An X-ray, CT scan, or MRI may be needed to determine if there were any associated injuries, such as fractures. TREATMENT  Specific treatment will depend on what area of the body was injured. In general, the best treatment for a contusion is resting, icing, elevating, and applying cold compresses to the injured area. Over-the-counter medicines may also be recommended for pain control. Ask your caregiver what the best treatment is for your contusion. HOME CARE INSTRUCTIONS   Put ice on the injured area.  Put ice in a plastic bag.  Place a towel between your skin and the bag.  Leave the ice on for 15-20 minutes, 3-4 times a day, or as directed by your health care provider.  Only take over-the-counter or prescription medicines for pain, discomfort, or fever as directed by your caregiver. Your caregiver may recommend avoiding anti-inflammatory medicines (aspirin, ibuprofen, and naproxen) for 48 hours because these medicines may increase bruising.  Rest the injured area.  If possible, elevate the injured area to reduce swelling. SEEK IMMEDIATE MEDICAL CARE IF:   You have increased bruising or swelling.  You have pain that is getting worse.  Your swelling or pain is not relieved with medicines. MAKE SURE YOU:   Understand these instructions.  Will watch your condition.  Will get help right  away if you are not doing well or get worse. Document Released: 02/17/2005 Document Revised: 05/15/2013 Document Reviewed: 03/15/2011 [facility] Patient Information 2015 [place], [place]. This information is not intended to replace advice given to you by your health care provider. Make sure you discuss any questions you have with your health care provider.  Nonepileptic Seizures Nonepileptic seizures are seizures that are not caused by abnormal electrical signals in your brain. These seizures often seem like epileptic seizures, but they are not caused by epilepsy.  There are two types of nonepileptic seizures:  A physiologic nonepileptic seizure results from a disruption in your brain.  A psychogenic seizure results from emotional stress. These seizures are sometimes called pseudoseizures. CAUSES  Causes of physiologic nonepileptic seizures include:   Sudden drop in blood pressure.  Low blood sugar.  Low levels of salt (sodium) in your blood.  Low levels of calcium in your blood.  Migraine.  Heart rhythm problems.  Sleep disorders.  Drug and alcohol abuse. Common causes of psychogenic nonepileptic seizures include:  Stress.  Emotional trauma.  Sexual or physical abuse.  Major life events, such as divorce or the death of a loved one.  Mental health disorders, including panic attack and hyperactivity disorder. SIGNS AND SYMPTOMS A nonepileptic seizure can look like an epileptic seizure, including uncontrollable shaking (convulsions), or changes in attention, behavior, or the ability to remain awake and alert. However, there are some differences. Nonepileptic seizures usually:  Do not cause physical injuries.  Start slowly.  Include crying or shrieking.  Last longer than  2 minutes.  Have a short recovery time without headache or exhaustion. DIAGNOSIS  Your health care provider can usually diagnose nonepileptic seizures after taking your medical history and giving you a  physical exam. Your health care provider may want to talk to your friends or relatives who have seen you have a seizure.  You may also need to have tests to look for causes of physiologic nonepileptic seizures. This may include an electroencephalogram (EEG), which is a test that measures electrical activity in your brain. If you have had an epileptic seizure, the results of your EEG will be abnormal. If your health care provider thinks you have had a psychogenic nonepileptic seizure, you may need to see a mental health specialist for an evaluation. TREATMENT  Treatment depends on the type and cause of your seizures.  For physiologic nonepileptic seizures, treatment is aimed at addressing the underlying condition that caused the seizures. These seizures usually stop when the underlying condition is properly treated.  Nonepileptic seizures do not respond to the seizure medicines used to treat epilepsy.  For psychogenic seizures, you may need to work with a mental health specialist. [place] care will depend on the type of nonepileptic seizures you have.   Follow all your health care provider's instructions.  Keep all your follow-up appointments. SEEK MEDICAL CARE IF: You continue to have seizures after treatment. SEEK IMMEDIATE MEDICAL CARE IF:  Your seizures change or become more frequent.  You injure yourself during a seizure.  You have one seizure after another.  You have trouble recovering from a seizure.  You have chest pain or trouble breathing. MAKE SURE YOU:  Understand these instructions.  Will watch your condition.  Will get help right away if you are not doing well or get worse. Document Released: 06/25/2005 Document Revised: 09/24/2013 Document Reviewed: 03/06/2013 [facility] Patient Information 2015 [place], [place]. This information is not intended to replace advice given to you by your health care provider. Make sure you discuss any questions you have  with your health care provider.     As we have discussed please follow-up with your neurologist as soon as possible regarding today's ER visit. Please take your prescribed Norco as needed for pain relief, but only as prescribed. Do not drink alcohol or take any other sedating medications while taking Norco including Klonopin. Return to the emergency department for any personally concerning symptoms.

## 2014-11-01 NOTE — ED Notes (Addendum)
States she had a seizure-like episode yesterday and states she hit her head and right arm.  Today she feels a little out of it and nauseated.  Today c/o head, neck, and right arm pain.  Is followed at [facility] for the seizures and states that epileptic seizures have been ruled out.

## 2015-01-08 DIAGNOSIS — N3010 Interstitial cystitis (chronic) without hematuria: Secondary | ICD-10-CM | POA: Insufficient documentation

## 2015-09-10 ENCOUNTER — Emergency Department
Admission: EM | Admit: 2015-09-10 | Discharge: 2015-09-10 | Disposition: A | Discharge status: Home / Self Care | Payer: Medicaid Other | Attending: Emergency Medicine | Admitting: Emergency Medicine

## 2015-09-10 ENCOUNTER — Emergency Department: Payer: Medicaid Other

## 2015-09-10 ENCOUNTER — Encounter: Payer: Self-pay | Admitting: *Deleted

## 2015-09-10 DIAGNOSIS — Z9851 Tubal ligation status: Secondary | ICD-10-CM | POA: Insufficient documentation

## 2015-09-10 DIAGNOSIS — Z91040 Latex allergy status: Secondary | ICD-10-CM | POA: Diagnosis not present

## 2015-09-10 DIAGNOSIS — I10 Essential (primary) hypertension: Secondary | ICD-10-CM | POA: Diagnosis not present

## 2015-09-10 DIAGNOSIS — F17210 Nicotine dependence, cigarettes, uncomplicated: Secondary | ICD-10-CM | POA: Insufficient documentation

## 2015-09-10 DIAGNOSIS — X58XXXA Exposure to other specified factors, initial encounter: Secondary | ICD-10-CM | POA: Diagnosis not present

## 2015-09-10 DIAGNOSIS — S161XXA Strain of muscle, fascia and tendon at neck level, initial encounter: Secondary | ICD-10-CM | POA: Insufficient documentation

## 2015-09-10 DIAGNOSIS — Y929 Unspecified place or not applicable: Secondary | ICD-10-CM | POA: Insufficient documentation

## 2015-09-10 DIAGNOSIS — Y939 Activity, unspecified: Secondary | ICD-10-CM | POA: Insufficient documentation

## 2015-09-10 DIAGNOSIS — E119 Type 2 diabetes mellitus without complications: Secondary | ICD-10-CM | POA: Insufficient documentation

## 2015-09-10 DIAGNOSIS — Y999 Unspecified external cause status: Secondary | ICD-10-CM | POA: Insufficient documentation

## 2015-09-10 DIAGNOSIS — C9591 Leukemia, unspecified, in remission: Secondary | ICD-10-CM | POA: Insufficient documentation

## 2015-09-10 DIAGNOSIS — F329 Major depressive disorder, single episode, unspecified: Secondary | ICD-10-CM | POA: Insufficient documentation

## 2015-09-10 HISTORY — DX: Essential (primary) hypertension: I10

## 2015-09-10 HISTORY — DX: Type 2 diabetes mellitus without complications: E11.9

## 2015-09-10 LAB — CBC WITH DIFFERENTIAL/PLATELET
Basophils Absolute: 0 10*3/uL (ref 0–0.1)
Basophils Relative: 0 %
EOS ABS: 0.4 10*3/uL (ref 0–0.7)
Eosinophils Relative: 7 %
HCT: 45.4 % (ref 35.0–47.0)
Hemoglobin: 15.3 g/dL (ref 12.0–16.0)
Lymphocytes Relative: 37 %
Lymphs Abs: 2.5 10*3/uL (ref 1.0–3.6)
MCH: 31 pg (ref 26.0–34.0)
MCHC: 33.7 g/dL (ref 32.0–36.0)
MCV: 92 fL (ref 80.0–100.0)
Monocytes Absolute: 0.8 10*3/uL (ref 0.2–0.9)
Monocytes Relative: 12 %
Neutro Abs: 3 10*3/uL (ref 1.4–6.5)
Neutrophils Relative %: 44 %
PLATELETS: 281 10*3/uL (ref 150–440)
RBC: 4.93 MIL/uL (ref 3.80–5.20)
RDW: 14.1 % (ref 11.5–14.5)
WBC: 6.7 10*3/uL (ref 3.6–11.0)

## 2015-09-10 LAB — COMPREHENSIVE METABOLIC PANEL
ALK PHOS: 61 U/L (ref 38–126)
ALT: 33 U/L (ref 14–54)
AST: 30 U/L (ref 15–41)
Albumin: 4.3 g/dL (ref 3.5–5.0)
Anion gap: 9 (ref 5–15)
BUN: 8 mg/dL (ref 6–20)
CALCIUM: 10.2 mg/dL (ref 8.9–10.3)
CO2: 34 mmol/L — ABNORMAL HIGH (ref 22–32)
Chloride: 97 mmol/L — ABNORMAL LOW (ref 101–111)
Creatinine, Ser: 0.93 mg/dL (ref 0.44–1.00)
GFR calc Af Amer: >60 mL/min (ref >60)
Glucose, Bld: 109 mg/dL — ABNORMAL HIGH (ref 65–99)
Potassium: 4 mmol/L (ref 3.5–5.1)
Sodium: 140 mmol/L (ref 135–145)
TOTAL PROTEIN: 7.4 g/dL (ref 6.5–8.1)
Total Bilirubin: 0.4 mg/dL (ref 0.3–1.2)

## 2015-09-10 LAB — URINALYSIS COMPLETE WITH MICROSCOPIC (ARMC ONLY)
BILIRUBIN URINE: NEGATIVE
Bacteria, UA: NONE SEEN
Glucose, UA: NEGATIVE mg/dL
Hgb urine dipstick: NEGATIVE
KETONES UR: NEGATIVE mg/dL
Leukocytes, UA: NEGATIVE
Nitrite: NEGATIVE
Protein, ur: NEGATIVE mg/dL
RBC / HPF: NONE SEEN RBC/hpf (ref 0–5)
Specific Gravity, Urine: 1.012 (ref 1.005–1.030)
pH: 9 — ABNORMAL HIGH (ref 5.0–8.0)

## 2015-09-10 LAB — POCT PREGNANCY, URINE: Preg Test, Ur: NEGATIVE

## 2015-09-10 MED ORDER — ONDANSETRON 4 MG PO TBDP
4.0000 mg | ORAL_TABLET | Freq: Once | ORAL | Status: AC
  Administered 2015-09-10: 4 mg via ORAL

## 2015-09-10 MED ORDER — IOPAMIDOL (ISOVUE-300) INJECTION 61%
75.0000 mL | Freq: Once | INTRAVENOUS | Status: AC | PRN
  Administered 2015-09-10: 100 mL via INTRAVENOUS
  Filled 2015-09-10: qty 75

## 2015-09-10 MED ORDER — ONDANSETRON 4 MG PO TBDP
ORAL_TABLET | ORAL | Status: AC
  Filled 2015-09-10: qty 1

## 2015-09-10 NOTE — ED Notes (Signed)
Pt has pain and swelling to right side of neck.  Pt woke up this am with swelling.  Hx leukemia and cervical cancer. No injury to neck  Pt alert.

## 2015-09-10 NOTE — ED Notes (Signed)
Pt transferred to CT via stretcher.  

## 2015-09-10 NOTE — ED Notes (Signed)
Pt returned from CT via stretcher.

## 2015-09-10 NOTE — ED Notes (Addendum)
Pt c/o swollen lympth nodes bilater, c/o pain into right side of neck going up through her head.No injury noted.  Pt states she is being tested for lymphoma at her PCP but could not get in today. Pt A&O.

## 2015-09-10 NOTE — Discharge Instructions (Signed)
As we discussed, your workup today was reassuring.  Though we do not know exactly what is causing your symptoms, it appears that you have no emergent medical condition at this time are safe to go home and follow up as recommended in this paperwork.  We recommend you use over-the-counter pain medication and heat therapy and follow up with your doctor at the next available opportunity.  Please return immediately to the Emergency Department if you develop any new or worsening symptoms that concern you.   Muscle Strain A muscle strain is an injury that occurs when a muscle is stretched beyond its normal length. Usually a small number of muscle fibers are torn when this happens. Muscle strain is rated in degrees. First-degree strains have the least amount of muscle fiber tearing and pain. Second-degree and third-degree strains have increasingly more tearing and pain.  Usually, recovery from muscle strain takes 1-2 weeks. Complete healing takes 5-6 weeks.  CAUSES  Muscle strain happens when a sudden, violent force placed on a muscle stretches it too far. This may occur with lifting, sports, or a fall.  RISK FACTORS Muscle strain is especially common in athletes.  SIGNS AND SYMPTOMS At the site of the muscle strain, there may be:  Pain.  Bruising.  Swelling.  Difficulty using the muscle due to pain or lack of normal function. DIAGNOSIS  Your health care provider will perform a physical exam and ask about your medical history. TREATMENT  Often, the best treatment for a muscle strain is resting, icing, and applying cold compresses to the injured area.  HOME CARE INSTRUCTIONS   Use the PRICE method of treatment to promote muscle healing during the first 2-3 days after your injury. The PRICE method involves:  Protecting the muscle from being injured again.  Restricting your activity and resting the injured body part.  Icing your injury. To do this, put ice in a plastic bag. Place a towel  between your skin and the bag. Then, apply the ice and leave it on from 15-20 minutes each hour. After the third day, switch to moist heat packs.  Apply compression to the injured area with a splint or elastic bandage. Be careful not to wrap it too tightly. This may interfere with blood circulation or increase swelling.  Elevate the injured body part above the level of your heart as often as you can.  Only take over-the-counter or prescription medicines for pain, discomfort, or fever as directed by your health care provider.  Warming up prior to exercise helps to prevent future muscle strains. SEEK MEDICAL CARE IF:   You have increasing pain or swelling in the injured area.  You have numbness, tingling, or a significant loss of strength in the injured area. MAKE SURE YOU:   Understand these instructions.  Will watch your condition.  Will get help right away if you are not doing well or get worse.   This information is not intended to replace advice given to you by your health care provider. Make sure you discuss any questions you have with your health care provider.   Document Released: 05/10/2005 Document Revised: 02/28/2013 Document Reviewed: 12/07/2012 Elsevier Interactive Patient Education [employer].

## 2015-09-10 NOTE — ED Provider Notes (Signed)
[facility] Emergency Department Provider Note  ____________________________________________  Time seen: Approximately 7:00 PM  I have reviewed the triage vital signs and the nursing notes.   HISTORY  Chief Complaint Neck Pain    HPI Bethany Little is a 30 y.o. female who reportedly has a very complex past medical history and is currently in the process of going to [facility] for evaluation of possible leukemia.  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  She has also been treated for cervical cancer.  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  She presents today for evaluation of new onset painful lymph nodes in the right side of her neck.  She reports that they have gradually gotten worse over the last 2 days.  She states that they feel hot "like there is fever in them" and she is worried she may have an infection.  She denies difficulty swallowing, difficulty breathing, and has not had any fever although she has had some subjective chills.  She called her primary care doctor today and was told to come to the emergency department.  She denies abdominal pain, nausea, vomiting, diarrhea, dysuria.  She has not had any injuries to her head or her neck.  She states that she has a chronic mass on the left side that is normal.  Nothing makes her pain better or worse.  She also states that she has a rash around the tick bite on her right side but that she cannot take antibiotics for tick borne infections because of education allergies.   Past Medical History  Diagnosis Date  . Cervical  abnormality   . Interstitial cystitis   . Depression   . Leukemia ([place])     Remission for 23 yrs  . Diabetes mellitus without complication ([place])   . Hypertension     Patient Active Problem List   Diagnosis Date Noted  . Elevated blood pressure 04/06/2012  . Leukemia ([place]) 04/04/2012  . Suicidal intent 04/03/2012  . Amitriptyline overdose 04/03/2012  . Depression 04/03/2012    Past Surgical History  Procedure Laterality Date  . Cesarean section    . Leep    . Tubal ligation      Current Outpatient Rx  Name  Route  Sig  Dispense  Refill  . dextroamphetamine (DEXEDRINE) 10 MG tablet   Oral   Take 5 mg by mouth 2 (two) times daily.         Marland Kitchen dextromethorphan-guaiFENesin (MUCINEX DM) 30-600 MG per 12 hr tablet   Oral   Take 1 tablet by mouth 2 (two) times daily.   14 tablet   0   . HYDROcodone-acetaminophen (NORCO/VICODIN) 5-325 MG per tablet   Oral   Take 1 tablet by mouth every 4 (four) hours as needed for moderate pain.   15 tablet   0   . loperamide (IMODIUM A-D) 2 MG tablet   Oral   Take 1 tablet (2 mg total) by mouth 4 (four) times daily as needed for diarrhea or loose stools.   30 tablet   0   . Lurasidone  HCl (LATUDA) 20 MG TABS   Oral   Take 1 tablet by mouth at bedtime.         . nortriptyline (PAMELOR) 10 MG capsule   Oral   Take 40 mg by mouth at bedtime.          Marland Kitchen PE-DM-APAP & Doxylamin-DM-APAP (VICKS DAYQUIL/NYQUIL CLD & FLU) (LIQUID) MISC   Oral   Take 15-30 mLs by mouth daily as needed (for cold symptoms).         . pentosan polysulfate (ELMIRON) 100 MG capsule   Oral   Take 100 mg by mouth at bedtime as needed.          Marland Kitchen Phenylephrine-APAP-Guaifenesin ([facility] COLD & SINUS MULTI-SYMPTOM PO)   Oral   Take 1-2 tablets by mouth daily as needed (for sinus and cold symptoms).         . promethazine (PHENERGAN) 25 MG tablet   Oral   Take 1 tablet (25 mg total) by mouth every 6 (six) hours as needed.   12 tablet   0   .  SUMAtriptan (IMITREX) 100 MG tablet   Oral   Take 100 mg by mouth every 2 (two) hours as needed for migraine or headache. May repeat in 2 hours if headache persists or recurs.           Allergies Erythromycin; Pertussis vaccines; Reglan; Tetracyclines & related; and Latex  Family History  Problem Relation Age of Onset  . Depression Mother     Social History Social History  Substance Use Topics  . Smoking status: Current Some Day Smoker -- 1.00 packs/day for 10 years    Types: Cigarettes  . Smokeless tobacco: None  . Alcohol Use: No     Comment: 1 to 2 drinks monthly    Review of Systems Constitutional: No fever/chills Eyes: No visual changes. ENT: No sore throat.  Pain in right side of neck with mild swelling of lymph nodes Cardiovascular: Denies chest pain. Respiratory: Denies shortness of breath. Gastrointestinal: No abdominal pain.  No nausea, no vomiting.  No diarrhea.  No constipation. Genitourinary: Negative for dysuria. Musculoskeletal: Negative for back pain. Skin: Red rash around a tick bite on her right side Neurological: Negative for headaches, focal weakness or numbness.  10-point ROS otherwise negative.  ____________________________________________   PHYSICAL EXAM:  VITAL SIGNS: ED Triage Vitals  Enc Vitals Group     BP 09/10/15 1649 140/100 mmHg     Pulse Rate 09/10/15 1649 117     Resp 09/10/15 1649 20     Temp 09/10/15 1649 98.6 F (37 C)     Temp Source 09/10/15 1649 Oral     SpO2 09/10/15 1649 98 %     Weight 09/10/15 1649 170 lb (77.111 kg)     Height 09/10/15 1649 5\' 4"  (1.626 m)     Head Cir --      Peak Flow --      Pain Score 09/10/15 1654 8     Pain Loc --      Pain Edu? --      Excl. in [place]? --     Constitutional: Alert and oriented. Well appearing and in no acute distress. Eyes: Conjunctivae are normal. PERRL. EOMI. Head: Atraumatic. Nose: No congestion/rhinnorhea. Mouth/Throat: Mucous membranes are moist.  Oropharynx  non-erythematous. Neck: No stridor.  No meningeal signs.  No cervical spine tenderness to palpation.  Do not appreciate any lymphadenopathy although the patient reports tenderness to palpation of the right  side of her neck.  No expanding hematomas or bruits Cardiovascular: Mild tachycardia triage, resolved in the exam room, regular rhythm. Good peripheral circulation. Grossly normal heart sounds.   Respiratory: Normal respiratory effort.  No retractions. Lungs CTAB. Gastrointestinal: Soft and nontender. No distention.  Musculoskeletal: No lower extremity tenderness nor edema. No gross deformities of extremities. Neurologic:  Normal speech and language. No gross focal neurologic deficits are appreciated.  Skin:  Skin is warm, dry and intact.  Small area of erythema around a scab on her right side, most consistent with an allergic reaction to a bug bite but not indicative of cellulitis or abscess and definitely not consistent with a tick borne infection/rash Psychiatric: Mood and affect are normal. Speech and behavior are normal.  ____________________________________________   LABS (all labs ordered are listed, but only abnormal results are displayed)  Labs Reviewed  COMPREHENSIVE METABOLIC PANEL - Abnormal; Notable for the following:    Chloride 97 (*)    CO2 34 (*)    Glucose, Bld 109 (*)    All other components within normal limits  URINALYSIS COMPLETEWITH MICROSCOPIC ([facility] ONLY) - Abnormal; Notable for the following:    Color, Urine YELLOW (*)    APPearance CLEAR (*)    pH 9.0 (*)    Squamous Epithelial / LPF 6-30 (*)    All other components within normal limits  CBC WITH DIFFERENTIAL/PLATELET  POCT PREGNANCY, URINE   ____________________________________________  EKG  None ____________________________________________  RADIOLOGY   Ct Soft Tissue Neck W Contrast  09/10/2015  CLINICAL DATA:  Bilateral swollen lymph nodes. Swelling, warmth, and tenderness to right side of  neck. EXAM: CT NECK WITH CONTRAST TECHNIQUE: Multidetector CT imaging of the neck was performed using the standard protocol following the bolus administration of intravenous contrast. CONTRAST:  190mL ISOVUE-300 IOPAMIDOL (ISOVUE-300) INJECTION 61% COMPARISON:  CT of the head and cervical spine 11/01/2014. FINDINGS: Pharynx and larynx: No focal mucosal or submucosal lesions are present. Vocal cords are midline and symmetric. The tongue base is unremarkable. Salivary glands: The submandibular and parotid glands are normal bilaterally. Thyroid: A 5 mm nodule in the left lobe is stable. The gland is otherwise within normal limits. Lymph nodes: Bilateral sub cm level 2 lymph nodes are present bilaterally, slightly more prominent on the right. These are ovoid in shape and appear reactive. No significant cervical adenopathy is present on either side. There is no mass lesion. Vascular: Negative Limited intracranial: Within normal limits. Visualized orbits: Negative Mastoids and visualized paranasal sinuses: Clear Skeleton: Negative.  No focal lytic or blastic lesions are present. Upper chest: The upper lung fields are clear. IMPRESSION: 1. Scattered subcentimeter level 2 lymph nodes bilaterally are within normal limits for age. 2. No significant cervical adenopathy. 3. Otherwise negative CT of the neck. Electronically Signed   By: San Morelle M.D.   On: 09/10/2015 19:51    ____________________________________________   PROCEDURES  Procedure(s) performed: None  Critical Care performed: No ____________________________________________   INITIAL IMPRESSION / ASSESSMENT AND PLAN / ED COURSE  Pertinent labs & imaging results that were available during my care of the patient were reviewed by me and considered in my medical decision making (see chart for details).  Given the degree of the complexity of this patient, I evaluated her neck with a CT scan with IV contrast.  The results were reassuring with  normal lymph nodes and no evidence of any deep tissue infection, abscess, mass, or other emergent or serious medical issue.  I provided  reassurance to the patient and encouraged her to follow up as planned.  There is no indication for antibiotics at this time.  ____________________________________________  FINAL CLINICAL IMPRESSION(S) / ED DIAGNOSES  Final diagnoses:  Neck strain, initial encounter      NEW MEDICATIONS STARTED DURING THIS VISIT:  Discharge Medication List as of 09/10/2015  8:11 PM        Note:  This document was prepared using Dragon voice recognition software and may include unintentional dictation errors.   Hinda Kehr, MD 09/10/15 2047

## 2015-09-10 NOTE — ED Notes (Signed)
poct pregnancy Negative 

## 2015-09-30 DIAGNOSIS — N3010 Interstitial cystitis (chronic) without hematuria: Secondary | ICD-10-CM | POA: Insufficient documentation

## 2015-09-30 DIAGNOSIS — G8929 Other chronic pain: Secondary | ICD-10-CM | POA: Insufficient documentation

## 2015-09-30 DIAGNOSIS — N939 Abnormal uterine and vaginal bleeding, unspecified: Secondary | ICD-10-CM | POA: Insufficient documentation

## 2015-09-30 DIAGNOSIS — R102 Pelvic and perineal pain: Secondary | ICD-10-CM

## 2015-12-20 ENCOUNTER — Emergency Department
Admission: EM | Admit: 2015-12-20 | Discharge: 2015-12-21 | Disposition: A | Discharge status: Home / Self Care | Payer: Medicaid Other | Attending: Emergency Medicine | Admitting: Emergency Medicine

## 2015-12-20 DIAGNOSIS — F1290 Cannabis use, unspecified, uncomplicated: Secondary | ICD-10-CM | POA: Insufficient documentation

## 2015-12-20 DIAGNOSIS — I10 Essential (primary) hypertension: Secondary | ICD-10-CM | POA: Insufficient documentation

## 2015-12-20 DIAGNOSIS — Z79899 Other long term (current) drug therapy: Secondary | ICD-10-CM | POA: Diagnosis not present

## 2015-12-20 DIAGNOSIS — R45851 Suicidal ideations: Secondary | ICD-10-CM | POA: Diagnosis present

## 2015-12-20 DIAGNOSIS — E119 Type 2 diabetes mellitus without complications: Secondary | ICD-10-CM | POA: Insufficient documentation

## 2015-12-20 DIAGNOSIS — F329 Major depressive disorder, single episode, unspecified: Secondary | ICD-10-CM | POA: Diagnosis not present

## 2015-12-20 DIAGNOSIS — F17210 Nicotine dependence, cigarettes, uncomplicated: Secondary | ICD-10-CM | POA: Diagnosis not present

## 2015-12-20 DIAGNOSIS — F32A Depression, unspecified: Secondary | ICD-10-CM

## 2015-12-20 DIAGNOSIS — Z791 Long term (current) use of non-steroidal anti-inflammatories (NSAID): Secondary | ICD-10-CM | POA: Insufficient documentation

## 2015-12-20 DIAGNOSIS — Z91040 Latex allergy status: Secondary | ICD-10-CM | POA: Insufficient documentation

## 2015-12-20 DIAGNOSIS — F1220 Cannabis dependence, uncomplicated: Secondary | ICD-10-CM

## 2015-12-20 LAB — CBC
HCT: 45.3 % (ref 35.0–47.0)
HEMOGLOBIN: 15.7 g/dL (ref 12.0–16.0)
MCH: 32.2 pg (ref 26.0–34.0)
MCHC: 34.7 g/dL (ref 32.0–36.0)
MCV: 92.6 fL (ref 80.0–100.0)
Platelets: 247 10*3/uL (ref 150–440)
RBC: 4.89 MIL/uL (ref 3.80–5.20)
RDW: 13.2 % (ref 11.5–14.5)
WBC: 11 10*3/uL (ref 3.6–11.0)

## 2015-12-20 LAB — URINE DRUG SCREEN, QUALITATIVE (ARMC ONLY)
Amphetamines, Ur Screen: POSITIVE — AB
BARBITURATES, UR SCREEN: NOT DETECTED
Benzodiazepine, Ur Scrn: NOT DETECTED
COCAINE METABOLITE, UR ~~LOC~~: NOT DETECTED
Cannabinoid 50 Ng, Ur ~~LOC~~: POSITIVE — AB
MDMA (Ecstasy)Ur Screen: NOT DETECTED
METHADONE SCREEN, URINE: NOT DETECTED
Opiate, Ur Screen: NOT DETECTED
Phencyclidine (PCP) Ur S: NOT DETECTED
TRICYCLIC, UR SCREEN: POSITIVE — AB

## 2015-12-20 LAB — COMPREHENSIVE METABOLIC PANEL
ALBUMIN: 4.3 g/dL (ref 3.5–5.0)
ALT: 23 U/L (ref 14–54)
AST: 28 U/L (ref 15–41)
Alkaline Phosphatase: 68 U/L (ref 38–126)
Anion gap: 9 (ref 5–15)
BUN: 12 mg/dL (ref 6–20)
CO2: 25 mmol/L (ref 22–32)
Calcium: 9.3 mg/dL (ref 8.9–10.3)
Chloride: 103 mmol/L (ref 101–111)
Creatinine, Ser: 0.83 mg/dL (ref 0.44–1.00)
GFR calc Af Amer: >60 mL/min (ref >60)
Glucose, Bld: 143 mg/dL — ABNORMAL HIGH (ref 65–99)
POTASSIUM: 3.6 mmol/L (ref 3.5–5.1)
SODIUM: 137 mmol/L (ref 135–145)
TOTAL PROTEIN: 7.8 g/dL (ref 6.5–8.1)
Total Bilirubin: 0.3 mg/dL (ref 0.3–1.2)

## 2015-12-20 LAB — ETHANOL

## 2015-12-20 LAB — PREGNANCY, URINE: Preg Test, Ur: NEGATIVE

## 2015-12-20 MED ORDER — DICYCLOMINE HCL 10 MG PO CAPS
10.0000 mg | ORAL_CAPSULE | Freq: Every day | ORAL | Status: DC
  Administered 2015-12-21: 10 mg via ORAL
  Filled 2015-12-20: qty 1

## 2015-12-20 MED ORDER — NORTRIPTYLINE HCL 10 MG PO CAPS
40.0000 mg | ORAL_CAPSULE | Freq: Every day | ORAL | Status: DC
  Administered 2015-12-21: 40 mg via ORAL
  Filled 2015-12-20 (×2): qty 4

## 2015-12-20 MED ORDER — ONDANSETRON 4 MG PO TBDP
4.0000 mg | ORAL_TABLET | Freq: Once | ORAL | Status: AC
  Administered 2015-12-20: 4 mg via ORAL
  Filled 2015-12-20: qty 1

## 2015-12-20 MED ORDER — FAMOTIDINE 20 MG PO TABS
20.0000 mg | ORAL_TABLET | Freq: Two times a day (BID) | ORAL | Status: DC
Start: 2015-12-20 — End: 2015-12-21
  Administered 2015-12-21: 20 mg via ORAL
  Filled 2015-12-20 (×2): qty 1

## 2015-12-20 MED ORDER — LORATADINE 10 MG PO TABS
10.0000 mg | ORAL_TABLET | Freq: Every day | ORAL | Status: DC
  Filled 2015-12-20: qty 1

## 2015-12-20 MED ORDER — CYCLOBENZAPRINE HCL 10 MG PO TABS
10.0000 mg | ORAL_TABLET | Freq: Three times a day (TID) | ORAL | Status: DC | PRN
  Administered 2015-12-21: 10 mg via ORAL
  Filled 2015-12-20 (×2): qty 1

## 2015-12-20 MED ORDER — OLANZAPINE 10 MG PO TABS
10.0000 mg | ORAL_TABLET | Freq: Every day | ORAL | Status: DC
  Administered 2015-12-21 (×2): 10 mg via ORAL
  Filled 2015-12-20 (×2): qty 1

## 2015-12-20 MED ORDER — HYDROXYZINE HCL 25 MG PO TABS
50.0000 mg | ORAL_TABLET | ORAL | Status: DC | PRN
  Administered 2015-12-21: 50 mg via ORAL
  Filled 2015-12-20: qty 2

## 2015-12-20 MED ORDER — NAPROXEN 250 MG PO TABS
500.0000 mg | ORAL_TABLET | Freq: Two times a day (BID) | ORAL | Status: DC
  Administered 2015-12-21: 500 mg via ORAL
  Filled 2015-12-20: qty 2

## 2015-12-20 MED ORDER — DIAZEPAM 5 MG PO TABS
5.0000 mg | ORAL_TABLET | Freq: Once | ORAL | Status: AC
  Administered 2015-12-20: 5 mg via ORAL
  Filled 2015-12-20: qty 1

## 2015-12-20 MED ORDER — ONDANSETRON HCL 4 MG PO TABS: 8.0000 mg | ORAL_TABLET | Freq: Every day | ORAL | Status: DC | PRN

## 2015-12-20 MED ORDER — PENTOSAN POLYSULFATE SODIUM 100 MG PO CAPS
100.0000 mg | ORAL_CAPSULE | Freq: Three times a day (TID) | ORAL | Status: DC
  Administered 2015-12-21 (×2): 100 mg via ORAL
  Filled 2015-12-20 (×4): qty 1
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
## 2015-12-20 NOTE — ED Provider Notes (Addendum)
[facility] Emergency Department Provider Note        Time seen: ----------------------------------------- 7:03 PM on 12/20/2015 -----------------------------------------    I have reviewed the triage vital signs and the nursing notes.   HISTORY  Chief Complaint Suicidal    HPI Bethany Little is a 30 y.o. female who is brought into the ER by the Police Department under involuntary commitment for suicidal ideation. Patient states she was helping her sister and her boyfriend by letting him stay there and states her boyfriend became upset and was breaking up with her so she slit her wrist because she was angry. She does not want to kill herself. She denies recent illness or other complaints.   Past Medical History:  Diagnosis Date  . Cervical abnormality   . Depression   . Diabetes mellitus without complication ([place])   . Hypertension   . Interstitial cystitis   . Leukemia ([place])    Remission for 23 yrs    Patient Active Problem List   Diagnosis Date Noted  . Elevated blood pressure 04/06/2012  . Leukemia ([place]) 04/04/2012  . Suicidal intent 04/03/2012  . Amitriptyline overdose 04/03/2012  . Depression 04/03/2012    Past Surgical History:  Procedure Laterality Date  . CESAREAN SECTION    . LEEP    . TUBAL LIGATION      Allergies Erythromycin; Pertussis vaccines; Reglan [metoclopramide]; Tetracyclines & related; and Latex  Social History Social History  Substance Use Topics  . Smoking status: Current Some Day Smoker    Packs/day: 1.00    Years: 10.00    Types: Cigarettes  . Smokeless tobacco: Never Used  . Alcohol use Yes     Comment: 1 to 2 drinks monthly    Review of Systems Constitutional: Negative for fever. Cardiovascular: Negative for chest pain. Respiratory: Negative for shortness of breath. Gastrointestinal: Negative for abdominal pain, vomiting and diarrhea. Genitourinary: Negative for dysuria. Musculoskeletal:  Negative for back pain. Skin: Positive for superficial lacerations Neurological: Negative for headaches, focal weakness or numbness. Psychiatric: Patient denies suicidal or homicidal ideation  10-point ROS otherwise negative.  ____________________________________________   PHYSICAL EXAM:  VITAL SIGNS: ED Triage Vitals  Enc Vitals Group     BP 12/20/15 1840 (!) 139/101     Pulse Rate 12/20/15 1840 (!) 125     Resp 12/20/15 1840 18     Temp 12/20/15 1840 98 F (36.7 C)     Temp Source 12/20/15 1840 Oral     SpO2 12/20/15 1840 97 %     Weight 12/20/15 1840 175 lb (79.4 kg)     Height 12/20/15 1840 5\' 4"  (1.626 m)     Head Circumference --      Peak Flow --      Pain Score 12/20/15 1842 9     Pain Loc --      Pain Edu? --      Excl. in [place]? --     Constitutional: Alert and oriented. Well appearing and in no distress. Eyes: Conjunctivae are normal. PERRL. Normal extraocular movements. ENT   Head: Normocephalic and atraumatic.   Nose: No congestion/rhinnorhea.   Mouth/Throat: Mucous membranes are moist.   Neck: No stridor. Cardiovascular: Normal rate, regular rhythm. No murmurs, rubs, or gallops. Respiratory: Normal respiratory effort without tachypnea nor retractions. Breath sounds are clear and equal bilaterally. No wheezes/rales/rhonchi. Gastrointestinal: Soft and nontender. Normal bowel sounds Musculoskeletal: Nontender with normal range of motion in all extremities. No lower extremity tenderness nor  edema. Neurologic:  Normal speech and language. No gross focal neurologic deficits are appreciated.  Skin:  Superficial lacerations are noted to the left forearm Psychiatric: Mood and affect are normal. Speech and behavior are normal.  ____________________________________________  ED COURSE:  Pertinent labs & imaging results that were available during my care of the patient were reviewed by me and considered in my medical decision making (see chart for  details). Clinical Course  Patient presents for involuntary commitment and concern for suicidal ideation. We will check basic labs and consult psychiatry.  Procedures ____________________________________________   LABS (pertinent positives/negatives)  Labs Reviewed  COMPREHENSIVE METABOLIC PANEL - Abnormal; Notable for the following:       Result Value   Glucose, Bld 143 (*)    All other components within normal limits  URINE DRUG SCREEN, QUALITATIVE ([facility] ONLY) - Abnormal; Notable for the following:    Tricyclic, Ur Screen POSITIVE (*)    Amphetamines, Ur Screen POSITIVE (*)    Cannabinoid 50 Ng, Ur [place] POSITIVE (*)    All other components within normal limits  ETHANOL  CBC  PREGNANCY, URINE   ____________________________________________  FINAL ASSESSMENT AND PLAN  Suicidal ideation, superficial lacerations.  Plan: Patient with labs as dictated above. Patient appears medically stable for psychiatric evaluation and disposition.Specialist on call psychiatry has recommended inpatient admission   Earleen Newport, MD   Note: This dictation was prepared with Dragon dictation. Any transcriptional errors that result from this process are unintentional    Earleen Newport, MD 12/20/15 1911    Earleen Newport, MD 12/20/15 2217

## 2015-12-20 NOTE — ED Notes (Signed)
Report called to Cynthia,RN in ED [facility]

## 2015-12-20 NOTE — ED Notes (Signed)
Report received from Lelon Frohlich, RN in ED at this time.

## 2015-12-20 NOTE — ED Notes (Signed)
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
## 2015-12-20 NOTE — ED Notes (Signed)
BEHAVIORAL HEALTH ROUNDING  Patient sleeping: No.  Patient alert and oriented: yes  Behavior appropriate: Yes. ; If no, describe:  Nutrition and fluids offered: Yes  Toileting and hygiene offered: Yes  Sitter present: not applicable, Q 15 min safety rounds and observation.  Law enforcement present: Yes ODS  

## 2015-12-21 DIAGNOSIS — F329 Major depressive disorder, single episode, unspecified: Secondary | ICD-10-CM

## 2015-12-21 DIAGNOSIS — F1220 Cannabis dependence, uncomplicated: Secondary | ICD-10-CM

## 2015-12-21 DIAGNOSIS — F32A Depression, unspecified: Secondary | ICD-10-CM

## 2015-12-21 MED ORDER — NAPROXEN 500 MG PO TABS
ORAL_TABLET | ORAL | Status: AC
  Administered 2015-12-21: 500 mg via ORAL
  Filled 2015-12-21: qty 1

## 2015-12-21 MED ORDER — LORAZEPAM 1 MG PO TABS
1.0000 mg | ORAL_TABLET | Freq: Once | ORAL | Status: AC
  Administered 2015-12-21: 1 mg via ORAL
  Filled 2015-12-21: qty 1

## 2015-12-21 NOTE — ED Notes (Signed)
BEHAVIORAL HEALTH ROUNDING  Patient sleeping: No.  Patient alert and oriented: yes  Behavior appropriate: Yes. ; If no, describe:  Nutrition and fluids offered: Yes  Toileting and hygiene offered: Yes  Sitter present: not applicable, Q 15 min safety rounds and observation.  Law enforcement present: Yes ODS  

## 2015-12-21 NOTE — ED Notes (Signed)
Patient discharged ambulatory to family. She denies SI or HI. Discharge instructions reviewed with staff, she verbalizes understanding. Patient received all personal belongings.

## 2015-12-21 NOTE — ED Notes (Signed)
Patient wakened to talk with MD. She reports she feels "better" and wants to be discharged. Denies SI or HI. Noted with several lacerations to L forearm. No s/s infection. Patient was given a meal tray. Maintained on 15 minute checks and observation by security camera for safety.

## 2015-12-21 NOTE — ED Notes (Signed)
Patient is asleep with easy unlabored breathing at this time in no apparent distress. Will continue to monitor for safety.

## 2015-12-21 NOTE — ED Notes (Signed)
Dr Burlene Arnt to bedside and dermabond applied by MD to laceration on left forearm.

## 2015-12-21 NOTE — ED Provider Notes (Signed)
Cleared for d/c by Dr. Jerilee Hoh of Psychiatry   Lavonia Drafts, MD 12/21/15 1228

## 2015-12-21 NOTE — ED Notes (Signed)
Patient asleep in room. No noted distress or abnormal behavior. Will continue 15 minute checks and observation by security cameras for safety. 

## 2015-12-21 NOTE — ED Notes (Signed)
Report provided to oncoming shift at this time.
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
## 2015-12-21 NOTE — ED Notes (Signed)

## 2015-12-21 NOTE — ED Notes (Addendum)
Dr. Burlene Arnt notified at this time regarding patient's complaint of continued anxiety.

## 2015-12-21 NOTE — ED Provider Notes (Signed)
-----------------------------------------   6:21 AM on 12/21/2015 -----------------------------------------   Blood pressure 117/80, pulse 86, temperature 98.1 F (36.7 C), temperature source Oral, resp. rate 18, height 5\' 4"  (1.626 m), weight 175 lb (79.4 kg), last menstrual period 12/17/2015, SpO2 98 %.  The patient had no acute events since last update.  Calm and cooperative at this time.  Disposition is pending per Psychiatry/Behavioral Medicine team recommendations.     Schuyler Amor, MD 12/21/15 [phone]

## 2015-12-21 NOTE — ED Notes (Signed)
Patient remains calm and cooperative with no issues to report at this time. Q15 minute rounding and security monitoring maintained.

## 2015-12-21 NOTE — ED Notes (Signed)
Patient to [facility] from ED ambulatory without difficulty, to room . Patient denies SI, HI and is cooperative. Patient made aware of security cameras and Q15 minute rounds. Patient encouraged to let Nursing staff know of any concerns or needs.

## 2015-12-21 NOTE — ED Notes (Signed)
Patient appearing very anxious while talking to this RN states that she needs to "calm down" because she has "stress related seizures" and reports that her last seizure was yesterday at her house. Patient reports that she remains under a lot of stress and feels that the valium received earlier was "not working". Patient also reports that she has 3 crooked vertebraes and has back issues but her main concern was hoping that her child's father would not find out that she was in the hospital because he "would probably cause trouble".This RN provided therapeutic communication and administered PRN medication for patient's complaints. Will continue to monitor for safety and medication effectiveness.

## 2015-12-26 ENCOUNTER — Encounter: Payer: Self-pay | Admitting: Emergency Medicine

## 2015-12-26 ENCOUNTER — Emergency Department: Payer: Medicaid Other

## 2015-12-26 ENCOUNTER — Emergency Department
Admission: EM | Admit: 2015-12-26 | Discharge: 2015-12-26 | Disposition: A | Discharge status: Home / Self Care | Payer: Medicaid Other | Attending: Emergency Medicine | Admitting: Emergency Medicine

## 2015-12-26 DIAGNOSIS — R300 Dysuria: Secondary | ICD-10-CM | POA: Diagnosis not present

## 2015-12-26 DIAGNOSIS — I10 Essential (primary) hypertension: Secondary | ICD-10-CM | POA: Diagnosis not present

## 2015-12-26 DIAGNOSIS — R102 Pelvic and perineal pain: Secondary | ICD-10-CM | POA: Diagnosis not present

## 2015-12-26 DIAGNOSIS — F17210 Nicotine dependence, cigarettes, uncomplicated: Secondary | ICD-10-CM | POA: Insufficient documentation

## 2015-12-26 DIAGNOSIS — E119 Type 2 diabetes mellitus without complications: Secondary | ICD-10-CM | POA: Diagnosis not present

## 2015-12-26 DIAGNOSIS — Z79899 Other long term (current) drug therapy: Secondary | ICD-10-CM | POA: Diagnosis not present

## 2015-12-26 DIAGNOSIS — R1030 Lower abdominal pain, unspecified: Secondary | ICD-10-CM | POA: Diagnosis present

## 2015-12-26 DIAGNOSIS — R110 Nausea: Secondary | ICD-10-CM | POA: Insufficient documentation

## 2015-12-26 LAB — COMPREHENSIVE METABOLIC PANEL
ALBUMIN: 4.4 g/dL (ref 3.5–5.0)
ALK PHOS: 66 U/L (ref 38–126)
ALT: 67 U/L — AB (ref 14–54)
AST: 81 U/L — AB (ref 15–41)
Anion gap: 15 (ref 5–15)
BILIRUBIN TOTAL: 1.1 mg/dL (ref 0.3–1.2)
BUN: 12 mg/dL (ref 6–20)
CO2: 25 mmol/L (ref 22–32)
CREATININE: 0.95 mg/dL (ref 0.44–1.00)
Calcium: 9.5 mg/dL (ref 8.9–10.3)
Chloride: 99 mmol/L — ABNORMAL LOW (ref 101–111)
GFR calc Af Amer: >60 mL/min (ref >60)
GFR calc non Af Amer: >60 mL/min (ref >60)
GLUCOSE: 108 mg/dL — AB (ref 65–99)
POTASSIUM: 2.7 mmol/L — AB (ref 3.5–5.1)
Sodium: 139 mmol/L (ref 135–145)
TOTAL PROTEIN: 7.6 g/dL (ref 6.5–8.1)

## 2015-12-26 LAB — URINALYSIS COMPLETE WITH MICROSCOPIC (ARMC ONLY)
BILIRUBIN URINE: NEGATIVE
Glucose, UA: NEGATIVE mg/dL
Hgb urine dipstick: NEGATIVE
Ketones, ur: NEGATIVE mg/dL
Leukocytes, UA: NEGATIVE
Nitrite: NEGATIVE
PH: 5 (ref 5.0–8.0)
PROTEIN: 30 mg/dL — AB
Specific Gravity, Urine: 1.011 (ref 1.005–1.030)

## 2015-12-26 LAB — CBC
HEMATOCRIT: 44.2 % (ref 35.0–47.0)
HEMOGLOBIN: 15.5 g/dL (ref 12.0–16.0)
MCH: 31.6 pg (ref 26.0–34.0)
MCHC: 35 g/dL (ref 32.0–36.0)
MCV: 90.2 fL (ref 80.0–100.0)
Platelets: 273 10*3/uL (ref 150–440)
RBC: 4.9 MIL/uL (ref 3.80–5.20)
RDW: 13.2 % (ref 11.5–14.5)
WBC: 24.2 10*3/uL — AB (ref 3.6–11.0)

## 2015-12-26 LAB — SALICYLATE LEVEL

## 2015-12-26 LAB — WET PREP, GENITAL
SPERM: NONE SEEN
TRICH WET PREP: NONE SEEN
YEAST WET PREP: NONE SEEN

## 2015-12-26 LAB — GLUCOSE, CAPILLARY: Glucose-Capillary: 115 mg/dL — ABNORMAL HIGH (ref 65–99)

## 2015-12-26 LAB — POCT PREGNANCY, URINE: Preg Test, Ur: NEGATIVE

## 2015-12-26 LAB — ACETAMINOPHEN LEVEL

## 2015-12-26 LAB — CHLAMYDIA/NGC RT PCR (ARMC ONLY)
Chlamydia Tr: NOT DETECTED
N gonorrhoeae: NOT DETECTED

## 2015-12-26 MED ORDER — DOXYCYCLINE HYCLATE 100 MG PO TABS
100.0000 mg | ORAL_TABLET | Freq: Once | ORAL | Status: AC
  Administered 2015-12-26: 100 mg via ORAL
  Filled 2015-12-26: qty 1

## 2015-12-26 MED ORDER — METRONIDAZOLE 500 MG PO TABS: 500.0000 mg | ORAL_TABLET | Freq: Three times a day (TID) | ORAL | 0 refills | Status: AC

## 2015-12-26 MED ORDER — DEXTROSE 5 % IV SOLN
1.0000 g | Freq: Once | INTRAVENOUS | Status: AC
  Administered 2015-12-26: 1 g via INTRAVENOUS
  Filled 2015-12-26: qty 10

## 2015-12-26 MED ORDER — OXYCODONE-ACETAMINOPHEN 5-325 MG PO TABS
2.0000 | ORAL_TABLET | Freq: Once | ORAL | Status: AC
  Administered 2015-12-26: 2 via ORAL
  Filled 2015-12-26: qty 2

## 2015-12-26 MED ORDER — POTASSIUM CHLORIDE IN NACL 20-0.9 MEQ/L-% IV SOLN
INTRAVENOUS | Status: AC
  Administered 2015-12-26: 1000 mL via INTRAVENOUS
  Filled 2015-12-26: qty 1000

## 2015-12-26 MED ORDER — POTASSIUM CHLORIDE CRYS ER 20 MEQ PO TBCR: 40.0000 meq | EXTENDED_RELEASE_TABLET | Freq: Once | ORAL | Status: DC

## 2015-12-26 MED ORDER — DIATRIZOATE MEGLUMINE & SODIUM 66-10 % PO SOLN
15.0000 mL | Freq: Once | ORAL | Status: AC
  Administered 2015-12-26: 15 mL via ORAL

## 2015-12-26 MED ORDER — DOXYCYCLINE HYCLATE 50 MG PO CAPS: 50.0000 mg | ORAL_CAPSULE | Freq: Two times a day (BID) | ORAL | 0 refills | Status: AC

## 2015-12-26 MED ORDER — HYDROCODONE-ACETAMINOPHEN 5-325 MG PO TABS: 2.0000 | ORAL_TABLET | Freq: Four times a day (QID) | ORAL | 0 refills | Status: DC | PRN

## 2015-12-26 MED ORDER — METRONIDAZOLE 500 MG PO TABS: 500.0000 mg | ORAL_TABLET | Freq: Three times a day (TID) | ORAL | 0 refills | Status: DC

## 2015-12-26 MED ORDER — POTASSIUM CHLORIDE IN NACL 20-0.9 MEQ/L-% IV SOLN
Freq: Once | INTRAVENOUS | Status: AC
  Administered 2015-12-26: 1000 mL via INTRAVENOUS

## 2015-12-26 MED ORDER — IOPAMIDOL (ISOVUE-300) INJECTION 61%
100.0000 mL | Freq: Once | INTRAVENOUS | Status: AC | PRN
  Administered 2015-12-26: 100 mL via INTRAVENOUS

## 2015-12-26 MED ORDER — ONDANSETRON HCL 4 MG/2ML IJ SOLN
4.0000 mg | Freq: Once | INTRAMUSCULAR | Status: AC
  Administered 2015-12-26: 4 mg via INTRAVENOUS
  Filled 2015-12-26: qty 2

## 2015-12-26 MED ORDER — DOXYCYCLINE HYCLATE 50 MG PO CAPS: 50.0000 mg | ORAL_CAPSULE | Freq: Two times a day (BID) | ORAL | 0 refills | Status: DC

## 2015-12-26 NOTE — ED Provider Notes (Signed)
6:08 PM Patient received in signout from Dr. Kerman Passey.  CT imaging and ultrasound are reviewed and normal. Vision hemodynamic stable. Repeat abdominal exam nontender. No focal neuro deficits at this time.  Based on pain on pelvic exam will treat for PID. Patient states she had a allergic reaction to tetracycline in the past but has tolerated doxycycline. We'll give dose here and observed for 30 minutes. I discussed symptoms for which she should return to the emergency department.  Have discussed with the patient and available family all diagnostics and treatments performed thus far and all questions were answered to the best of my ability. The patient demonstrates understanding and agreement with plan.    Merlyn Lot, MD 12/26/15 [phone]

## 2015-12-26 NOTE — ED Notes (Signed)
Patient unable to tolerate oral contrast due to nausea. CT informed

## 2015-12-26 NOTE — ED Provider Notes (Signed)
[facility] Emergency Department Provider Note  Time seen: 1:21 PM  I have reviewed the triage vital signs and the nursing notes.   HISTORY  Chief Complaint Seizures; Emesis; and Abdominal Pain    HPI Bethany Little is a 30 y.o. female with a past medical history of depression, hypertension, diabetes who presents to the emergency department after a seizure. According to EMS report the patient was being seen at [facility] [facility] for urinary tract infection symptoms. Patient states lower abdominal pain and urinary discomfort for the past several days. While being evaluated at her primary care doctor's office she had seizure-like activity and was administered 10 mg of rectal diazepam. Upon arrival to the emergency department the patient is somnolent, awakens to voice and will answer questions but then falls back asleep. Patient was able to give a history including 2-3 days of lower abdominal pain and dysuria. Denies fever, diarrhea. Patient states she has been having seizures for many years, but no one has prescribed her medications for the seizures. Denies any headache.  Past Medical History:  Diagnosis Date  . Cervical abnormality   . Depression   . Diabetes mellitus without complication ([place])   . Hypertension   . Interstitial cystitis   . Leukemia ([place])    Remission for 23 yrs    Patient Active Problem List   Diagnosis Date Noted  . Unspecified depressive disorder. 12/21/2015  . Cannabis use disorder, moderate, dependence ([place]) 12/21/2015  . Leukemia ([place]) 04/04/2012    Past Surgical History:  Procedure Laterality Date  . CESAREAN SECTION    . LEEP    . TUBAL LIGATION      Prior to Admission medications   Medication Sig Start Date End Date Taking? Authorizing Provider  cetirizine (ZYRTEC) 10 MG tablet Take 10 mg by mouth daily.    Historical Provider, MD  cyclobenzaprine (FLEXERIL) 10 MG tablet Take 10 mg by mouth 3 (three) times  daily as needed for muscle spasms.    Historical Provider, MD  dicyclomine (BENTYL) 10 MG capsule Take 10 mg by mouth daily.    Historical Provider, MD  naproxen (NAPROSYN) 500 MG tablet Take 500 mg by mouth 2 (two) times daily with a meal.    Historical Provider, MD  nortriptyline (PAMELOR) 10 MG capsule Take 40 mg by mouth at bedtime.     Historical Provider, MD  ondansetron (ZOFRAN) 4 MG tablet Take 8 mg by mouth daily as needed for nausea or vomiting.    Historical Provider, MD  pentosan polysulfate (ELMIRON) 100 MG capsule Take 100 mg by mouth 3 (three) times daily.     Historical Provider, MD  ranitidine (ZANTAC) 150 MG tablet Take 150 mg by mouth daily.    Historical Provider, MD  SUMAtriptan (IMITREX) 100 MG tablet Take 100 mg by mouth every 2 (two) hours as needed for migraine or headache. May repeat in 2 hours if headache persists or recurs.    Historical Provider, MD  traZODone (DESYREL) 150 MG tablet Take 150 mg by mouth at bedtime.    Historical Provider, MD  venlafaxine XR (EFFEXOR-XR) 150 MG 24 hr capsule Take 150 mg by mouth daily with breakfast.    Historical Provider, MD    Allergies  Allergen Reactions  . Erythromycin Hives    Childhood allergy  . Pertussis Vaccines Hives    Childhood allergy  . Reglan [Metoclopramide] Other (See Comments)    Muscle Spasms  . Tetracyclines & Related Hives  Childhood allergy  . Latex Itching and Rash    Family History  Problem Relation Age of Onset  . Depression Mother     Social History Social History  Substance Use Topics  . Smoking status: Current Some Day Smoker    Packs/day: 1.00    Years: 10.00    Types: Cigarettes  . Smokeless tobacco: Never Used  . Alcohol use Yes     Comment: 1 to 2 drinks monthly    Review of Systems Constitutional: Negative for fever. Cardiovascular: Negative for chest pain. Respiratory: Negative for shortness of breath. Gastrointestinal: Mild lower abdominal pain.Positive for nausea.  Negative for diarrhea. Genitourinary: Mild dysuria. Musculoskeletal: Negative for back pain. Skin: Negative for rash. Neurological: Negative for headaches, focal weakness or numbness. 10-point ROS otherwise negative.  ____________________________________________   PHYSICAL EXAM:  VITAL SIGNS: ED Triage Vitals  Enc Vitals Group     BP 12/26/15 1247 121/82     Pulse Rate 12/26/15 1247 93     Resp 12/26/15 1247 10     Temp 12/26/15 1247 98.2 F (36.8 C)     Temp Source 12/26/15 1247 Oral     SpO2 12/26/15 1247 97 %     Weight 12/26/15 1247 178 lb 1.6 oz (80.8 kg)     Height 12/26/15 1247 5\' 4"  (1.626 m)     Head Circumference --      Peak Flow --      Pain Score 12/26/15 1248 8     Pain Loc --      Pain Edu? --      Excl. in [place]? --     Constitutional:Somnolent, but awakens easily to voice and will answer questions. Follow commands. Oriented 4.. Well appearing and in no distress. Eyes: Normal exam ENT   Head: Normocephalic and atraumatic.   Mouth/Throat: Mucous membranes are moist. Cardiovascular: Normal rate, regular rhythm. No murmur Respiratory: Normal respiratory effort without tachypnea nor retractions. Breath sounds are clear  Gastrointestinal soft, mild suprapubic tenderness palpation. No rebound or guarding. No distention. Musculoskeletal: Nontender with normal range of motion in all extremities.  Neurologic:  Normal speech and language. No gross focal neurologic deficits are appreciated. Skin:  Skin is warm, dry and intact.  Psychiatric: Mood and affect are normal. Speech and behavior are normal.   ____________________________________________    EKG  EKG reviewed and interpreted by myself shows normal sinus rhythm at 92 bpm, narrow QRS, normal axis, normal intervals besides a slightly prolonged QTC of 500 ms, no concerning ST changes noted.  ____________________________________________    INITIAL IMPRESSION / ASSESSMENT AND PLAN / ED  COURSE  Pertinent labs & imaging results that were available during my care of the patient were reviewed by me and considered in my medical decision making (see chart for details).  Patient presents the emergency department after a possible seizure while being seen by her primary care doctor for urinary tract infection symptoms. We'll obtain labs including urinalysis in the emergency department. I reviewed the patient's records she has been diagnosed with psychogenic nonepileptic seizure activity. She had a 48 hour EEG in which she had multiple episodes of shaking, however a normal EEG. Patient is somnolent however she received 10 mg of rectal diazepam at a primary care doctor's office. We will check labs, monitor closely in the emergency department. Patient agreeable to plan.  Patient was seen at the primary care doctor today for lower abdominal pain. Pelvic exam shows moderate diffuse pain, moderate cervical tenderness to palpation,  however patient has a history of chronic pelvic pain per mother, it is not clear what is acute or chronic. Moderate vaginal discharge. Currently awaiting lab results.  Wet prep positive for bacterial vaginitis. Given the patient's elevated white blood cell count 24,000 with lower abdominal pain we'll proceed with a CT the abdomen/pelvis as well as a pelvic ultrasound to further evaluate. Given the patient's cervical motion tenderness with positive bacteria vaginitis we'll cover for PID with Rocephin and doxycycline. The patient is agreeable to this plan. Patient states she has been nauseated, unable to keep down any food for the past one week. We will IV hydrate while awaiting for the results. Patient's LFTs are mildly elevated, however the patient has no right upper quadrant tenderness to palpation. I have added on acetaminophen level to help further evaluate, given the patient's recent psychiatric admission.  Patient care signed out to Dr.  Quentin Cornwall.  ____________________________________________   FINAL CLINICAL IMPRESSION(S) / ED DIAGNOSES  Seizure like activity Dysuria   Harvest Dark, MD 12/26/15 1517

## 2015-12-26 NOTE — ED Notes (Signed)
Patient transported to Ultrasound 

## 2015-12-26 NOTE — ED Provider Notes (Signed)
   New provider had some problem with the pharmacy and DEA number approval, and so I provided the prescriptions myself. I reviewed the chart. I reviewed the patient's [employer] drug database for the 6 tablets of hydrocodone.Lisa Roca, MD 12/26/15 2004

## 2015-12-26 NOTE — ED Triage Notes (Signed)
Patient brought in by [facility] from [place] for seizure like activity. Patient was being seen there for UTI symptoms when she reportedly had a seizure. Patient was given Diazepam 10 mg rectally.

## 2016-01-28 DIAGNOSIS — F445 Conversion disorder with seizures or convulsions: Secondary | ICD-10-CM | POA: Insufficient documentation

## 2016-03-05 ENCOUNTER — Other Ambulatory Visit: Payer: Self-pay | Admitting: Family Medicine

## 2016-03-05 DIAGNOSIS — N6320 Unspecified lump in the left breast, unspecified quadrant: Principal | ICD-10-CM

## 2016-03-05 DIAGNOSIS — N6310 Unspecified lump in the right breast, unspecified quadrant: Secondary | ICD-10-CM

## 2016-03-10 ENCOUNTER — Other Ambulatory Visit: Payer: Self-pay | Admitting: Family Medicine

## 2016-03-10 ENCOUNTER — Ambulatory Visit (INDEPENDENT_AMBULATORY_CARE_PROVIDER_SITE_OTHER): Payer: Medicaid Other | Admitting: Urology

## 2016-03-10 ENCOUNTER — Encounter: Payer: Self-pay | Admitting: Urology

## 2016-03-10 VITALS — BP 116/77 | HR 112 | Temp 98.3°F | Ht 64.0 in | Wt 172.9 lb

## 2016-03-10 DIAGNOSIS — N6320 Unspecified lump in the left breast, unspecified quadrant: Principal | ICD-10-CM

## 2016-03-10 DIAGNOSIS — R109 Unspecified abdominal pain: Secondary | ICD-10-CM

## 2016-03-10 DIAGNOSIS — N3000 Acute cystitis without hematuria: Secondary | ICD-10-CM

## 2016-03-10 DIAGNOSIS — N6310 Unspecified lump in the right breast, unspecified quadrant: Secondary | ICD-10-CM

## 2016-03-10 DIAGNOSIS — N390 Urinary tract infection, site not specified: Secondary | ICD-10-CM | POA: Diagnosis not present

## 2016-03-10 LAB — URINALYSIS, COMPLETE
Bilirubin, UA: NEGATIVE
GLUCOSE, UA: NEGATIVE
Ketones, UA: NEGATIVE
Nitrite, UA: NEGATIVE
PH UA: 5.5 (ref 5.0–7.5)
Specific Gravity, UA: 1.025 (ref 1.005–1.030)
Urobilinogen, Ur: 0.2 mg/dL (ref 0.2–1.0)

## 2016-03-10 LAB — BLADDER SCAN AMB NON-IMAGING: Scan Result: 0

## 2016-03-10 LAB — MICROSCOPIC EXAMINATION

## 2016-03-10 NOTE — Patient Instructions (Signed)

## 2016-03-10 NOTE — Progress Notes (Signed)
0   03/10/2016 2:46 PM   Bethany Little 1985-06-12 [id number]  Referring provider: Herminio Commons, MD [address] [place],  [address]  Chief Complaint  Patient presents with  . New Patient (Initial Visit)    Cystitis referred by Dr. Sabino Snipes    HPI: Patient is a 30 -year-old Caucasian female who is referred to [place] by, Dr. Ancil Boozer, for recurrent urinary tract infections.  Patient states that she has had 4 to 5 urinary tract infections over the last year.  Her symptoms with a urinary tract infection consist of suprapubic pain, foul odor, small discharge, dysuria and labial swelling.  She endorses dysuria, gross hematuria, suprapubic pain, back pain, abdominal pain or flank pain.  She has had recent fevers, chills, nausea or vomiting.   She does not have a history of nephrolithiasis, GU surgery or GU trauma.   Reviewing her records,  she has had two documented UTI's with E. Coli in 2016.  No recent cultures are available to me at this visit.      She is sexually active.  She has noted a correlation with her urinary tract infections and sexual intercourse.  She does not engage in anal sex.   She is voiding before and after sex.    She admits to constipation and/or diarrhea.  She has IBS.    She does use tampons.  She does engage in good perineal hygiene. She does not take tub baths.   She has have incontinence.  She is not wearing pads.   She is having pain with bladder filling.    She underwent a contrast study on 12/26/2015 it was a negative study.  I have personally reviewed the films.    She is not drinking a lot of water daily.  She is drinking 3 cans of [employer] daily.  She is smoking half a pack a day of cigarettes and some marijuana use.   She is experiencing 10-15 trips to the bathroom during the day. She states that she suffers from insomnia, so she is voiding during the night. She had been on Elmiron and Vesicare without relief of her  symptoms.  She also complains of right-sided flank pain.  Her UA today is positive for 3-10 rbc's per high-power field. Her PVR is 0 mL.  PMH: Past Medical History:  Diagnosis Date  . Anxiety   . Arthritis   . Cervical abnormality   . Depression   . Diabetes mellitus without complication ([place])   . Heartburn   . Hypertension   . Interstitial cystitis   . 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 Seizures ([place])   . Sleep apnea     Surgical History: Past Surgical History:  Procedure Laterality Date  . CARDIAC CATHETERIZATION     87 weeks old  . CESAREAN SECTION    . LEEP    . TUBAL LIGATION      Home Medications:    Medication List       Accurate as of 03/10/16  2:46 PM. Always use your most recent med list.          amphetamine-dextroamphetamine 20 MG 24 hr capsule Commonly known as:  ADDERALL XR Take 20 mg by mouth.   cetirizine 10 MG tablet Commonly known as:  ZYRTEC Take 10 mg by mouth daily.   cyclobenzaprine 10 MG tablet Commonly known as:  FLEXERIL Take 10 mg by mouth 3 (three) times daily as needed  for muscle spasms.   dicyclomine 10 MG capsule Commonly known as:  BENTYL Take 10 mg by mouth daily.   diphenhydrAMINE 25 mg capsule Commonly known as:  BENADRYL Take 25 mg by mouth.   hydrochlorothiazide 12.5 MG tablet Commonly known as:  HYDRODIURIL Take 12.5 mg by mouth.   HYDROcodone-acetaminophen 5-325 MG tablet Commonly known as:  NORCO Take 2 tablets by mouth every 6 (six) hours as needed for moderate pain.   lamoTRIgine 25 MG tablet Commonly known as:  LAMICTAL Wk1 25mg PMwk2 25mg 2xdaywk3 25AM50PMwk4 502xdaywk5 50AM75PMwk6 752xdaywk7 75AM100PMwk8 1002xdaywk9 100AM125PMwk10 1252xday   naproxen 500 MG tablet Commonly known as:  NAPROSYN Take 500 mg by mouth 2 (two) times daily with a meal.   ondansetron 4 MG tablet Commonly known as:  ZOFRAN Take 8 mg by mouth daily as needed for nausea or vomiting.   pentosan polysulfate 100 MG  capsule Commonly known as:  ELMIRON Take 100 mg by mouth 3 (three) times daily.   ranitidine 150 MG tablet Commonly known as:  ZANTAC Take 150 mg by mouth daily.   solifenacin 5 MG tablet Commonly known as:  VESICARE Take 5 mg by mouth.   SUMAtriptan 100 MG tablet Commonly known as:  IMITREX Take 100 mg by mouth every 2 (two) hours as needed for migraine or headache. May repeat in 2 hours if headache persists or recurs.   traZODone 150 MG tablet Commonly known as:  DESYREL Take 150 mg by mouth.   venlafaxine 75 MG tablet Commonly known as:  EFFEXOR Take 75 mg by mouth.       Allergies:  Allergies  Allergen Reactions  . Tetracycline Hcl Hives    Childhood allergy  . Erythromycin Hives    Childhood allergy  . Pertussis Vaccines Hives    Childhood allergy  . Reglan [Metoclopramide] Other (See Comments)    Muscle Spasms  . Tetracyclines & Related Hives    Childhood allergy  . Latex Itching and Rash    Family History: Family History  Problem Relation Age of Onset  . Depression Mother   . Bipolar disorder Mother   . Kidney disease Father     partial kidney fx  . COPD Father   . Pancreatic cancer Paternal Grandmother   . Celiac disease Paternal Grandmother   . Hypertension Paternal Grandmother   . Heart failure Paternal Grandfather   . Prostate cancer Other   . Bladder Cancer Neg Hx     Social History:  reports that she has been smoking Cigarettes.  She has a 10.00 pack-year smoking history. She has never used smokeless tobacco. She reports that she drinks alcohol. She reports that she uses drugs, including Marijuana.  ROS: UROLOGY Frequent Urination?: Yes Hard to postpone urination?: Yes Burning/pain with urination?: Yes Get up at night to urinate?: Yes Leakage of urine?: No Urine stream starts and stops?: Yes Trouble starting stream?: No Do you have to strain to urinate?: Yes Blood in urine?: No Urinary tract infection?: Yes Sexually transmitted  disease?: No Injury to kidneys or bladder?: No Painful intercourse?: No Weak stream?: Yes Currently pregnant?: No Vaginal bleeding?: No Last menstrual period?: n  Gastrointestinal Nausea?: Yes Vomiting?: Yes Indigestion/heartburn?: Yes Diarrhea?: Yes Constipation?: Yes  Constitutional Fever: Yes Night sweats?: Yes Weight loss?: Yes Fatigue?: Yes  Skin Skin rash/lesions?: No Itching?: Yes  Eyes Blurred vision?: No Double vision?: No  Ears/Nose/Throat Sore throat?: No Sinus problems?: No  Hematologic/Lymphatic Swollen glands?: Yes Easy bruising?: Yes  Cardiovascular Leg swelling?: Yes Chest  pain?: No  Respiratory Cough?: No Shortness of breath?: Yes  Endocrine Excessive thirst?: Yes  Musculoskeletal Back pain?: Yes Joint pain?: Yes  Neurological Headaches?: Yes Dizziness?: No  Psychologic Depression?: Yes Anxiety?: Yes  Physical Exam: BP 116/77   Pulse (!) 112   Temp 98.3 F (36.8 C) (Oral)   Ht 5\' 4"  (1.626 m)   Wt 172 lb 14.4 oz (78.4 kg)   LMP 03/10/2016   BMI 29.68 kg/m   Constitutional: Well nourished. Alert and oriented, No acute distress. HEENT: [place] AT, moist mucus membranes. Trachea midline, no masses. Cardiovascular: No clubbing, cyanosis, or edema. Respiratory: Normal respiratory effort, no increased work of breathing. GI: Abdomen is soft, non tender, non distended, no abdominal masses. Liver and spleen not palpable.  No hernias appreciated.  Stool sample for occult testing is not indicated.   GU: No CVA tenderness.  No bladder fullness or masses.  Normal external genitalia, normal pubic hair distribution, no lesions.  Normal urethral meatus, no lesions, no prolapse, no discharge.   No urethral masses, tenderness and/or tenderness. No bladder fullness or masses. Bladder is tender.  Normal vagina mucosa, good estrogen effect, no discharge, no lesions, good pelvic support, no cystocele or rectocele noted.  No cervical motion  tenderness.  Uterus is freely mobile and non-fixed.  No adnexal/parametria masses or tenderness noted.  Anus and perineum are without rashes or lesions.    Skin: No rashes, bruises or suspicious lesions. Lymph: No cervical or inguinal adenopathy. Neurologic: Grossly intact, no focal deficits, moving all 4 extremities. Psychiatric: Normal mood and affect.  Laboratory Data: Lab Results  Component Value Date   WBC 24.2 (H) 12/26/2015   HGB 15.5 12/26/2015   HCT 44.2 12/26/2015   MCV 90.2 12/26/2015   PLT 273 12/26/2015    Lab Results  Component Value Date   CREATININE 0.95 12/26/2015    Lab Results  Component Value Date   TSH 0.80 01/04/2013     Lab Results  Component Value Date   AST 81 (H) 12/26/2015   Lab Results  Component Value Date   ALT 67 (H) 12/26/2015     Urinalysis Unremarkable.  Patient on menses.  See EPIC.    Pertinent Imaging: CLINICAL DATA:  30 year old female with seizure. Lower abdominal pain and UTI symptoms for several days. Initial encounter.  EXAM: CT ABDOMEN AND PELVIS WITH CONTRAST  TECHNIQUE: Multidetector CT imaging of the abdomen and pelvis was performed using the standard protocol following bolus administration of intravenous contrast.  CONTRAST:  18mL ISOVUE-300 IOPAMIDOL (ISOVUE-300) INJECTION 61%  COMPARISON:  Pelvis ultrasound from today reported separately. CT Abdomen and Pelvis 04/11/2014.  FINDINGS: Negative lung bases.  No pericardial or pleural effusion.  No acute osseous abnormality identified.  No pelvic free fluid. Negative uterus and adnexa. Diminutive urinary bladder. Decompressed rectum. Decompressed sigmoid colon.  Negative left colon and transverse colon. Oral contrast has reached the ascending colon. Negative right colon and appendix. Negative terminal ileum. No dilated small bowel. Negative stomach and duodenum.  No abdominal free air or free fluid. Negative liver, gallbladder, spleen,  pancreas and adrenal glands. Portal venous system is patent. Major arterial structures are patent. Bilateral renal enhancement appears stable and normal. No perinephric stranding. No hydronephrosis or hydroureter. No definite urologic calculus. No lymphadenopathy.  IMPRESSION: Negative CT Abdomen and Pelvis.  Normal appendix.   Electronically Signed   By: Genevie Ann M.D.   On: 12/26/2015 17:51   Assessment & Plan:    1. Recurrent UTI's  -  Patient is instructed to increase her water intake until the urine is pale yellow or clear.  I have advised her to take probiotics (yogurt, oral pills or vaginal suppositories), take cranberry pills or drink the juice and use the estrogen cream.  She is to take Vitamin C 1,000 mg daily to acidify the urine.  If using tampons, she should remove them prior to urinating and change them often.  She should also avoid soaking in tubs and wipe front to back after urinating.     - Because of her history of recurrent UTI's, I have asked the patient to contact our office if she should experience symptoms of urinary tract infection so that we can CATH her for an urine specimen for urinalysis and culture. This is to prevent a skin contaminant from showing up in the urine culture.  If she should have her symptoms after hours or cannot get to our office, she should notify her other providers that she needs a catheterized specimen for UA and culture.   - I reviewed the symptoms of a urinary tract infection, such as a worsening of urinary urgency and frequency, dysuria, which is painful urination and not the pain of urine hitting sensitive perineal skin, hematuria, foul-smelling urine, suprapubic pain or mental status changes. Fevers, chills, nausea and or vomiting can also be signs of a possible UTI.  Positive urinalyses and positive urine cultures that are not associated with urinary symptoms should not be treated with antibiotics.    - I explained to the patient that being  exposed to unnecessary antibiotics can put her at risk for increasing resistance of the bacteria to antibiotics, C. difficile and the side effects of the antibiotics.              - Urinalysis, Complete  - CULTURE, URINE COMPREHENSIVE  2. Cystitis  - offered behavioral therapies - encouraged patient to stop smoking as it is a bladder irritant  - bladder training, bladder control strategies and pelvic floor muscle training- will refer patient to PT  - fluid management - encouraged to increase her water consumption to 10 to 12 cups of water daily and eliminate Mountain Dew from her diet  - BLADDER SCAN AMB NON-IMAGING  3. Right flank pain  - obtain RUS to rule out hydronephrosis/stone   Return for RUS report.  These notes generated with voice recognition software. I apologize for typographical errors.  Zara Council, [facility] [address] [place], [place] [address] [phone]

## 2016-03-15 ENCOUNTER — Telehealth: Payer: Self-pay

## 2016-03-15 DIAGNOSIS — N390 Urinary tract infection, site not specified: Secondary | ICD-10-CM

## 2016-03-15 LAB — CULTURE, URINE COMPREHENSIVE

## 2016-03-15 MED ORDER — AMOXICILLIN-POT CLAVULANATE 875-125 MG PO TABS
1.0000 | ORAL_TABLET | Freq: Two times a day (BID) | ORAL | 0 refills | Status: AC
Start: 2016-03-15 — End: 2016-03-22

## 2016-03-15 NOTE — Telephone Encounter (Signed)
Spoke with pt in reference to +ucx. Made aware abx was sent to pharmacy. Pt voiced understanding.  Pt stated that medicaid will only cover 1 PT appointment. Please advise.

## 2016-03-15 NOTE — Telephone Encounter (Signed)
-----   Message from Nori Riis, PA-C sent at 03/15/2016  2:08 PM EDT ----- Patient has a +UCx.  They need to start Augmentin 875/125,  one tablet twice daily for seven days.  They also need to take a probiotic with the antibiotic course.  The dosage is listed below:  L. acidophilus and L. casei (25 x 109 CFU/day for 2 days, then 50 x 109 CFU/day for duration of the antibiotic course)

## 2016-03-15 NOTE — Telephone Encounter (Signed)
She should take advantage of that one appointment with PT.

## 2016-03-16 NOTE — Telephone Encounter (Signed)
LMOM

## 2016-03-16 NOTE — Telephone Encounter (Signed)
Spoke with pt in reference to taking advantage of PT. Pt voiced understanding.

## 2016-03-18 ENCOUNTER — Telehealth: Payer: Self-pay | Admitting: Urology

## 2016-03-18 NOTE — Telephone Encounter (Signed)
Done

## 2016-03-18 NOTE — Telephone Encounter (Signed)
I can't do her referral for her scan until the chart is closed. They need notes faxed.  Thanks,  michelle

## 2016-03-22 DIAGNOSIS — R569 Unspecified convulsions: Secondary | ICD-10-CM | POA: Insufficient documentation

## 2016-03-23 ENCOUNTER — Ambulatory Visit
Admission: RE | Admit: 2016-03-23 | Discharge: 2016-03-23 | Disposition: A | Discharge status: Home / Self Care | Payer: Medicaid Other | Source: Ambulatory Visit | Attending: Urology | Admitting: Urology

## 2016-03-23 DIAGNOSIS — R109 Unspecified abdominal pain: Secondary | ICD-10-CM | POA: Diagnosis not present

## 2016-03-25 ENCOUNTER — Ambulatory Visit
Admission: RE | Admit: 2016-03-25 | Discharge: 2016-03-25 | Disposition: A | Discharge status: Home / Self Care | Payer: Medicaid Other | Source: Ambulatory Visit | Attending: Family Medicine | Admitting: Family Medicine

## 2016-03-25 DIAGNOSIS — N6320 Unspecified lump in the left breast, unspecified quadrant: Principal | ICD-10-CM

## 2016-03-25 DIAGNOSIS — N6310 Unspecified lump in the right breast, unspecified quadrant: Secondary | ICD-10-CM

## 2016-03-25 DIAGNOSIS — N6311 Unspecified lump in the right breast, upper outer quadrant: Secondary | ICD-10-CM | POA: Insufficient documentation

## 2016-03-25 HISTORY — DX: Malignant neoplasm of cervix uteri, unspecified: C53.9

## 2016-03-29 ENCOUNTER — Ambulatory Visit: Payer: Self-pay | Admitting: Urology

## 2016-03-30 ENCOUNTER — Ambulatory Visit (INDEPENDENT_AMBULATORY_CARE_PROVIDER_SITE_OTHER): Payer: Medicaid Other | Admitting: Urology

## 2016-03-30 ENCOUNTER — Encounter: Payer: Self-pay | Admitting: Urology

## 2016-03-30 VITALS — BP 100/69 | HR 93 | Temp 97.8°F | Ht 64.0 in | Wt 174.1 lb

## 2016-03-30 DIAGNOSIS — N3000 Acute cystitis without hematuria: Secondary | ICD-10-CM

## 2016-03-30 DIAGNOSIS — N390 Urinary tract infection, site not specified: Secondary | ICD-10-CM

## 2016-03-30 DIAGNOSIS — R109 Unspecified abdominal pain: Secondary | ICD-10-CM

## 2016-03-30 LAB — URINALYSIS, COMPLETE
BILIRUBIN UA: NEGATIVE
Glucose, UA: NEGATIVE
KETONES UA: NEGATIVE
LEUKOCYTES UA: NEGATIVE
Nitrite, UA: NEGATIVE
PROTEIN UA: NEGATIVE
RBC, UA: NEGATIVE
Urobilinogen, Ur: 0.2 mg/dL (ref 0.2–1.0)
pH, UA: 5.5 (ref 5.0–7.5)

## 2016-03-30 LAB — MICROSCOPIC EXAMINATION: Epithelial Cells (non renal): >10 /hpf — AB (ref 0–10)

## 2016-03-30 LAB — BLADDER SCAN AMB NON-IMAGING: Scan Result: 35

## 2016-03-30 NOTE — Progress Notes (Signed)
0   03/30/2016 11:37 AM   Bethany Little [birth year] [id number]  Referring provider: Steele Sizer, MD [address] [place], [place] [address]  Chief Complaint  Patient presents with  . Follow-up    Renal US results    HPI: Patient is a 30 -year-old Caucasian female who presents today to discuss her renal ultrasound results.  Background history Patient was referred to [place] by, Dr. Ancil Boozer, for recurrent urinary tract infections.  Patient states that she has had 4 to 5 urinary tract infections over the last year.  Her symptoms with a urinary tract infection consist of suprapubic pain, foul odor, small discharge, dysuria and labial swelling.  She endorses dysuria, gross hematuria, suprapubic pain, back pain, abdominal pain or flank pain.  She has had recent fevers, chills, nausea or vomiting.  She does not have a history of nephrolithiasis, GU surgery or GU trauma.  Reviewing her records,  she has had two documented UTI's with E. Coli in 2016.  No recent cultures are available to me at this visit.   She is sexually active.  She has noted a correlation with her urinary tract infections and sexual intercourse.  She does not engage in anal sex.   She is voiding before and after sex.   She admits to constipation and/or diarrhea.  She has IBS.  She does use tampons.  She does engage in good perineal hygiene. She does not take tub baths.  She has have incontinence.  She is not wearing pads.  She is having pain with bladder filling.  She underwent a contrast study on 12/26/2015 it was a negative study.  I have personally reviewed the films.  She is not drinking a lot of water daily.  She is drinking 3 cans of [employer] daily.  She is smoking half a pack a day of cigarettes and some marijuana use.  She is experiencing 10-15 trips to the bathroom during the day. She states that she suffers from insomnia, so she is voiding during the night. She had been on Elmiron and Vesicare without relief  of her symptoms.  She also complains of right-sided flank pain.  Her UA today was positive for 3-10 rbc's per high-power field. Her PVR was 0 mL.  Renal ultrasound completed on 03/23/2016 was normal. I have independently reviewed the films.  Today, she is still experiencing frequency, urgency, dysuria, nocturia, incontinence, intermittency and straining to urinate.  She denies gross hematuria. She also denies fevers, chills, nausea and vomiting.  Her UA today is unremarkable.  Her PVR was 35 mL.   She has not completely eliminated her consumption of [facility] or stop smoking.  PMH: Past Medical History:  Diagnosis Date  . Anxiety   . Arthritis   . Cervical abnormality   . Cervical cancer ([place]) 2003  . Depression   . Diabetes mellitus without complication ([place])   . Heartburn   . Hypertension   . Interstitial cystitis   . 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 Seizures ([place])   . Sleep apnea     Surgical History: Past Surgical History:  Procedure Laterality Date  . CARDIAC CATHETERIZATION     17 weeks old  . CESAREAN SECTION    . LEEP    . TUBAL LIGATION      Home Medications:    Medication List       Accurate as of 03/30/16 11:37 AM. Always use your most recent med list.  amphetamine-dextroamphetamine 20 MG 24 hr capsule Commonly known as:  ADDERALL XR Take 20 mg by mouth.   cetirizine 10 MG tablet Commonly known as:  ZYRTEC Take 10 mg by mouth daily.   cyclobenzaprine 10 MG tablet Commonly known as:  FLEXERIL Take 10 mg by mouth 3 (three) times daily as needed for muscle spasms.   dicyclomine 10 MG capsule Commonly known as:  BENTYL Take 10 mg by mouth daily.   diphenhydrAMINE 25 mg capsule Commonly known as:  BENADRYL Take 25 mg by mouth.   hydrochlorothiazide 12.5 MG tablet Commonly known as:  HYDRODIURIL Take 12.5 mg by mouth.   HYDROcodone-acetaminophen 5-325 MG tablet Commonly known as:  NORCO Take 2 tablets by mouth every 6  (six) hours as needed for moderate pain.   lamoTRIgine 25 MG tablet Commonly known as:  LAMICTAL Wk1 25mg PMwk2 25mg 2xdaywk3 25AM50PMwk4 502xdaywk5 50AM75PMwk6 752xdaywk7 75AM100PMwk8 1002xdaywk9 100AM125PMwk10 1252xday   naproxen 500 MG tablet Commonly known as:  NAPROSYN Take 500 mg by mouth 2 (two) times daily with a meal.   ondansetron 4 MG tablet Commonly known as:  ZOFRAN Take 8 mg by mouth daily as needed for nausea or vomiting.   pentosan polysulfate 100 MG capsule Commonly known as:  ELMIRON Take 100 mg by mouth 3 (three) times daily.   ranitidine 150 MG tablet Commonly known as:  ZANTAC Take 150 mg by mouth daily.   solifenacin 5 MG tablet Commonly known as:  VESICARE Take 5 mg by mouth.   SUMAtriptan 100 MG tablet Commonly known as:  IMITREX Take 100 mg by mouth every 2 (two) hours as needed for migraine or headache. May repeat in 2 hours if headache persists or recurs.   traZODone 150 MG tablet Commonly known as:  DESYREL Take 150 mg by mouth.   venlafaxine 75 MG tablet Commonly known as:  EFFEXOR Take 75 mg by mouth.   zolpidem 5 MG tablet Commonly known as:  AMBIEN Take 5 mg by mouth at bedtime as needed for sleep.       Allergies:  Allergies  Allergen Reactions  . Tetracycline Hcl Hives    Childhood allergy  . Erythromycin Hives    Childhood allergy  . Pertussis Vaccines Hives    Childhood allergy  . Reglan [Metoclopramide] Other (See Comments)    Muscle Spasms  . Tetracyclines & Related Hives    Childhood allergy  . Latex Itching and Rash    Family History: Family History  Problem Relation Age of Onset  . Depression Mother   . Bipolar disorder Mother   . Kidney disease Father     partial kidney fx  . COPD Father   . Pancreatic cancer Paternal Grandmother   . Celiac disease Paternal Grandmother   . Hypertension Paternal Grandmother   . Heart failure Paternal Grandfather   . Prostate cancer Other   . Bladder Cancer Neg Hx      Social History:  reports that she has been smoking Cigarettes.  She has a 10.00 pack-year smoking history. She has never used smokeless tobacco. She reports that she drinks alcohol. She reports that she uses drugs, including Marijuana.  ROS: UROLOGY Frequent Urination?: Yes Hard to postpone urination?: Yes Burning/pain with urination?: Yes Get up at night to urinate?: Yes Leakage of urine?: Yes Urine stream starts and stops?: Yes Trouble starting stream?: No Do you have to strain to urinate?: Yes Blood in urine?: No Urinary tract infection?: Yes Sexually transmitted disease?: No Injury to kidneys or bladder?:  No Painful intercourse?: Yes Weak stream?: No Currently pregnant?: No Vaginal bleeding?: No Last menstrual period?: 03/10/2016  Gastrointestinal Nausea?: Yes Vomiting?: Yes Indigestion/heartburn?: Yes Diarrhea?: Yes Constipation?: Yes  Constitutional Fever: Yes Night sweats?: Yes Weight loss?: Yes Fatigue?: Yes  Skin Skin rash/lesions?: No Itching?: No  Eyes Blurred vision?: No Double vision?: No  Ears/Nose/Throat Sore throat?: No Sinus problems?: No  Hematologic/Lymphatic Swollen glands?: Yes Easy bruising?: Yes  Cardiovascular Leg swelling?: Yes Chest pain?: No  Respiratory Cough?: Yes Shortness of breath?: No  Endocrine Excessive thirst?: Yes  Musculoskeletal Back pain?: Yes Joint pain?: Yes  Neurological Headaches?: Yes Dizziness?: Yes  Psychologic Depression?: No Anxiety?: Yes  Physical Exam: BP 100/69 (BP Location: Left Arm, Patient Position: Sitting, Cuff Size: Large)   Pulse 93   Temp 97.8 F (36.6 C) (Oral)   Ht 5\' 4"  (1.626 m)   Wt 174 lb 1.6 oz (79 kg)   LMP 03/10/2016   BMI 29.88 kg/m   Constitutional: Well nourished. Alert and oriented, No acute distress. HEENT: [place] AT, moist mucus membranes. Trachea midline, no masses. Cardiovascular: No clubbing, cyanosis, or edema. Respiratory: Normal respiratory  effort, no increased work of breathing. GI: Abdomen is soft, non tender, non distended, no abdominal masses. Liver and spleen not palpable.  No hernias appreciated.  Stool sample for occult testing is not indicated.   GU: No CVA tenderness.  No bladder fullness or masses.   Skin: No rashes, bruises or suspicious lesions. Lymph: No cervical or inguinal adenopathy. Neurologic: Grossly intact, no focal deficits, moving all 4 extremities. Psychiatric: Normal mood and affect.  Laboratory Data: Lab Results  Component Value Date   WBC 24.2 (H) 12/26/2015   HGB 15.5 12/26/2015   HCT 44.2 12/26/2015   MCV 90.2 12/26/2015   PLT 273 12/26/2015    Lab Results  Component Value Date   CREATININE 0.95 12/26/2015    Lab Results  Component Value Date   TSH 0.80 01/04/2013     Lab Results  Component Value Date   AST 81 (H) 12/26/2015   Lab Results  Component Value Date   ALT 67 (H) 12/26/2015     Urinalysis Unremarkable.  See EPIC.    Pertinent Imaging: CLINICAL DATA:  Bilateral flank pain. Recurrent urinary tract infections  EXAM: RENAL / URINARY TRACT ULTRASOUND COMPLETE  COMPARISON:  CT abdomen and pelvis December 26, 2015  FINDINGS: Right Kidney:  Length: 10.4 cm. Echogenicity and renal cortical thickness are within normal limits. No mass, perinephric fluid, or hydronephrosis visualized. No sonographically demonstrable calculus or ureterectasis.  Left Kidney:  Length: 12.2 cm. Echogenicity and renal cortical thickness are within normal limits. No mass, perinephric fluid, or hydronephrosis visualized. No sonographically demonstrable calculus or ureterectasis.  Bladder:  Appears normal for degree of bladder distention.  IMPRESSION: Study within normal limits.   Electronically Signed   By: Lowella Grip III M.D.   On: 03/23/2016 15:51  Results for FIRDAWS, HARTZLER (MRN [id number]) as of 04/11/2016 14:35  Ref. Range 03/30/2016 11:36  Scan  Result Unknown 35   Assessment & Plan:    1. Recurrent UTI's  - Reviewed UTI prevention techniques  - Asked the patient to contact us with symptoms of urinary tract infections             - Urinalysis, Complete   2. Cystitis  - offered behavioral therapies - encouraged patient to stop smoking as it is a bladder irritant  - bladder training, bladder control strategies and pelvic floor  muscle training- will refer patient to PT-her insurance will only pay for one visit  - fluid management - encouraged to increase her water consumption to 10 to 12 cups of water daily and eliminate Mountain Dew from her diet  - BLADDER SCAN AMB NON-IMAGING  3. Right flank pain  - no etiology found for flank pain   Return in about 3 months (around 06/30/2016) for OAB questionairre, PVR and exam.  These notes generated with voice recognition software. I apologize for typographical errors.  Zara Council, [facility] [address], [place], [place] [address] [phone]

## 2016-04-01 ENCOUNTER — Other Ambulatory Visit: Payer: Self-pay | Admitting: Family Medicine

## 2016-04-01 DIAGNOSIS — N6310 Unspecified lump in the right breast, unspecified quadrant: Secondary | ICD-10-CM

## 2016-04-09 ENCOUNTER — Ambulatory Visit
Admission: RE | Admit: 2016-04-09 | Discharge: 2016-04-09 | Disposition: A | Discharge status: Home / Self Care | Payer: Medicaid Other | Source: Ambulatory Visit | Attending: Family Medicine | Admitting: Family Medicine

## 2016-04-09 DIAGNOSIS — N6310 Unspecified lump in the right breast, unspecified quadrant: Secondary | ICD-10-CM

## 2016-04-09 DIAGNOSIS — N6311 Unspecified lump in the right breast, upper outer quadrant: Secondary | ICD-10-CM | POA: Insufficient documentation

## 2016-04-12 LAB — SURGICAL PATHOLOGY

## 2016-04-13 ENCOUNTER — Ambulatory Visit (INDEPENDENT_AMBULATORY_CARE_PROVIDER_SITE_OTHER): Payer: Medicaid Other

## 2016-04-13 VITALS — BP 126/86 | HR 108 | Temp 98.6°F | Ht 64.0 in | Wt 175.1 lb

## 2016-04-13 DIAGNOSIS — N390 Urinary tract infection, site not specified: Secondary | ICD-10-CM

## 2016-04-13 HISTORY — PX: BREAST BIOPSY: SHX20

## 2016-04-13 LAB — URINALYSIS, COMPLETE
BILIRUBIN UA: NEGATIVE
GLUCOSE, UA: NEGATIVE
KETONES UA: NEGATIVE
NITRITE UA: NEGATIVE
UUROB: 0.2 mg/dL (ref 0.2–1.0)
pH, UA: 5.5 (ref 5.0–7.5)

## 2016-04-13 LAB — MICROSCOPIC EXAMINATION: Bacteria, UA: NONE SEEN

## 2016-04-13 NOTE — Progress Notes (Signed)
Pt presented today with c/o urinary frequency and urgency, hard to postpone urination, dysuria, back pain, lower abd pain, nausea, fever, and night sweats. A cath specimen was obtained for u/a and cx.   Blood pressure 126/86, pulse (!) 108, temperature 98.6 F (37 C), height 5\' 4"  (1.626 m), weight 175 lb 1.6 oz (79.4 kg).

## 2016-04-16 ENCOUNTER — Other Ambulatory Visit: Payer: Self-pay | Admitting: Urology

## 2016-04-16 LAB — CULTURE, URINE COMPREHENSIVE

## 2016-04-16 MED ORDER — AMOXICILLIN-POT CLAVULANATE 875-125 MG PO TABS: 1.0000 | ORAL_TABLET | Freq: Two times a day (BID) | ORAL | 0 refills | Status: DC

## 2016-04-19 ENCOUNTER — Telehealth: Payer: Self-pay

## 2016-04-19 NOTE — Telephone Encounter (Signed)
LMOM

## 2016-04-19 NOTE — Telephone Encounter (Signed)
-----   Message from Nori Riis, PA-C sent at 04/16/2016  7:07 PM EST ----- Patient has a +UCx.  They need to start Augmentin 875/125 mg,  one tablet twice daily for seven days.  They also need to take a probiotic with the antibiotic course.  The dosage is listed below:  L. acidophilus and L. casei (25 x 109 CFU/day for 2 days, then 50 x 109 CFU/day for duration of the antibiotic course)  I have e scribed their prescription to [facility].

## 2016-04-20 NOTE — Telephone Encounter (Signed)
Spoke with pt in reference to picking up abx. Pt stated she picked up last week and has been taking them.

## 2016-04-29 ENCOUNTER — Ambulatory Visit: Payer: Medicaid Other | Admitting: Physical Therapy

## 2016-05-05 ENCOUNTER — Ambulatory Visit: Payer: Medicaid Other | Attending: Urology | Admitting: Physical Therapy

## 2016-06-21 ENCOUNTER — Encounter: Payer: Self-pay | Admitting: Emergency Medicine

## 2016-06-21 DIAGNOSIS — R05 Cough: Secondary | ICD-10-CM | POA: Insufficient documentation

## 2016-06-21 DIAGNOSIS — R5383 Other fatigue: Secondary | ICD-10-CM | POA: Diagnosis not present

## 2016-06-21 DIAGNOSIS — F17210 Nicotine dependence, cigarettes, uncomplicated: Secondary | ICD-10-CM | POA: Insufficient documentation

## 2016-06-21 DIAGNOSIS — R509 Fever, unspecified: Secondary | ICD-10-CM | POA: Insufficient documentation

## 2016-06-21 DIAGNOSIS — Z8541 Personal history of malignant neoplasm of cervix uteri: Secondary | ICD-10-CM | POA: Diagnosis not present

## 2016-06-21 DIAGNOSIS — E119 Type 2 diabetes mellitus without complications: Secondary | ICD-10-CM | POA: Insufficient documentation

## 2016-06-21 DIAGNOSIS — R197 Diarrhea, unspecified: Secondary | ICD-10-CM | POA: Insufficient documentation

## 2016-06-21 DIAGNOSIS — R1110 Vomiting, unspecified: Secondary | ICD-10-CM | POA: Insufficient documentation

## 2016-06-21 DIAGNOSIS — I10 Essential (primary) hypertension: Secondary | ICD-10-CM | POA: Diagnosis not present

## 2016-06-21 DIAGNOSIS — Z79899 Other long term (current) drug therapy: Secondary | ICD-10-CM | POA: Diagnosis not present

## 2016-06-21 LAB — BASIC METABOLIC PANEL
Anion gap: 5 (ref 5–15)
BUN: 14 mg/dL (ref 6–20)
CHLORIDE: 109 mmol/L (ref 101–111)
CO2: 26 mmol/L (ref 22–32)
CREATININE: 0.83 mg/dL (ref 0.44–1.00)
Calcium: 8.6 mg/dL — ABNORMAL LOW (ref 8.9–10.3)
GFR calc non Af Amer: >60 mL/min (ref >60)
Glucose, Bld: 118 mg/dL — ABNORMAL HIGH (ref 65–99)
Potassium: 3.9 mmol/L (ref 3.5–5.1)
SODIUM: 140 mmol/L (ref 135–145)

## 2016-06-21 LAB — CBC
HEMATOCRIT: 38.6 % (ref 35.0–47.0)
Hemoglobin: 13.5 g/dL (ref 12.0–16.0)
MCH: 32.8 pg (ref 26.0–34.0)
MCHC: 35 g/dL (ref 32.0–36.0)
MCV: 93.5 fL (ref 80.0–100.0)
Platelets: 266 10*3/uL (ref 150–440)
RBC: 4.13 MIL/uL (ref 3.80–5.20)
RDW: 12.5 % (ref 11.5–14.5)
WBC: 7.9 10*3/uL (ref 3.6–11.0)

## 2016-06-21 LAB — URINALYSIS, COMPLETE (UACMP) WITH MICROSCOPIC
BACTERIA UA: NONE SEEN
Bilirubin Urine: NEGATIVE
GLUCOSE, UA: NEGATIVE mg/dL
KETONES UR: NEGATIVE mg/dL
Leukocytes, UA: NEGATIVE
Nitrite: NEGATIVE
PROTEIN: NEGATIVE mg/dL
Specific Gravity, Urine: 1.008 (ref 1.005–1.030)
pH: 7 (ref 5.0–8.0)

## 2016-06-21 LAB — TROPONIN I

## 2016-06-21 LAB — GLUCOSE, CAPILLARY: Glucose-Capillary: 95 mg/dL (ref 65–99)

## 2016-06-21 NOTE — ED Triage Notes (Signed)
Pt ambulatory to triage with slow gait. Pt c/o N/V, generalized weakness since receiving the flu shot 3 months ago. Pt reports symptoms have worsened in the last week along with pt having racing heart rate. Hx of DM, cancer and hypertension.

## 2016-06-22 ENCOUNTER — Emergency Department
Admission: EM | Admit: 2016-06-22 | Discharge: 2016-06-22 | Disposition: A | Discharge status: Home / Self Care | Payer: Medicaid Other | Attending: Emergency Medicine | Admitting: Emergency Medicine

## 2016-06-22 DIAGNOSIS — R197 Diarrhea, unspecified: Secondary | ICD-10-CM

## 2016-06-22 DIAGNOSIS — R1110 Vomiting, unspecified: Secondary | ICD-10-CM

## 2016-06-22 LAB — TSH: TSH: 1.398 u[IU]/mL (ref 0.350–4.500)

## 2016-06-22 LAB — INFLUENZA PANEL BY PCR (TYPE A & B)
INFLAPCR: NEGATIVE
INFLBPCR: NEGATIVE

## 2016-06-22 NOTE — ED Provider Notes (Signed)
[facility] Emergency Department Provider Note    First MD Initiated Contact with Patient 06/22/16 [phone]     (approximate)  I have reviewed the triage vital signs and the nursing notes.   HISTORY  Chief Complaint Fever; Emesis; and Weakness    HPI Bethany Little is a 31 y.o. female with below list of chronic medical conditions presents with a three-month history of vomiting and diarrhea fever cough generalized fatigue rapid heart rate.   Past Medical History:  Diagnosis Date  . Anxiety   . Arthritis   . Cervical abnormality   . Cervical cancer ([place]) 2003  . Depression   . Diabetes mellitus without complication ([place])   . Heartburn   . Hypertension   . Interstitial cystitis   . Leukemia ([place])    Remission for 23 yrs  . Seizures ([place])   . Sleep apnea     Patient Active Problem List   Diagnosis Date Noted  . Seizure ([place]) 03/22/2016  . 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 Unspecified depressive disorder. 12/21/2015  . Cannabis use disorder, moderate, dependence ([place]) 12/21/2015  . Abnormal uterine bleeding 09/30/2015  . Chronic interstitial cystitis 09/30/2015  . Chronic pelvic pain in female 09/30/2015  . Cystitis, interstitial 01/08/2015  . Elevated blood pressure 04/06/2012  . Leukemia ([place]) 04/04/2012  . Amitriptyline overdose 04/03/2012  . Suicidal intent 04/03/2012  . 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 CARDIAC CATHETERIZATION     64 weeks old  . CESAREAN SECTION    . LEEP    . TUBAL LIGATION      Prior to Admission medications   Medication Sig Start Date End Date Taking? Authorizing Provider  amoxicillin-clavulanate (AUGMENTIN) 875-125 MG tablet Take 1 tablet by mouth every 12 (twelve) hours. 04/16/16   Nori Riis, PA-C  amphetamine-dextroamphetamine (ADDERALL XR) 20 MG 24 hr capsule Take 20 mg by mouth.    Historical Provider, MD  cetirizine (ZYRTEC) 10  MG tablet Take 10 mg by mouth daily.    Historical Provider, MD  cyclobenzaprine (FLEXERIL) 10 MG tablet Take 10 mg by mouth 3 (three) times daily as needed for muscle spasms.    Historical Provider, MD  dicyclomine (BENTYL) 10 MG capsule Take 10 mg by mouth daily.    Historical Provider, MD  diphenhydrAMINE (BENADRYL) 25 mg capsule Take 25 mg by mouth.    Historical Provider, MD  hydrochlorothiazide (HYDRODIURIL) 12.5 MG tablet Take 12.5 mg by mouth.    Historical Provider, MD  HYDROcodone-acetaminophen (NORCO) 5-325 MG tablet Take 2 tablets by mouth every 6 (six) hours as needed for moderate pain. Patient not taking: Reported on 03/30/2016 12/26/15   Lisa Roca, MD  lamoTRIgine (LAMICTAL) 25 MG tablet Wk1 25mg PMwk2 25mg 2xdaywk3 25AM50PMwk4 502xdaywk5 50AM75PMwk6 752xdaywk7 75AM100PMwk8 1002xdaywk9 100AM125PMwk10 1252xday 01/28/16   Historical Provider, MD  naproxen (NAPROSYN) 500 MG tablet Take 500 mg by mouth 2 (two) times daily with a meal.    Historical Provider, MD  ondansetron (ZOFRAN) 4 MG tablet Take 8 mg by mouth daily as needed for nausea or vomiting.    Historical Provider, MD  pentosan polysulfate (ELMIRON) 100 MG capsule Take 100 mg by mouth 3 (three) times daily.     Historical Provider, MD  ranitidine (ZANTAC) 150 MG tablet Take 150 mg by mouth daily.    Historical Provider, MD  solifenacin (VESICARE) 5 MG tablet Take 5 mg by mouth. 12/19/15   Historical Provider,  MD  SUMAtriptan (IMITREX) 100 MG tablet Take 100 mg by mouth every 2 (two) hours as needed for migraine or headache. May repeat in 2 hours if headache persists or recurs.    Historical Provider, MD  traZODone (DESYREL) 150 MG tablet Take 150 mg by mouth.    Historical Provider, MD  venlafaxine (EFFEXOR) 75 MG tablet Take 75 mg by mouth.    Historical Provider, MD  zolpidem (AMBIEN) 5 MG tablet Take 5 mg by mouth at bedtime as needed for sleep.    Historical Provider, MD    Allergies Tetracycline hcl; Erythromycin; Pertussis  vaccines; Reglan [metoclopramide]; Tetracyclines & related; and Latex  Family History  Problem Relation Age of Onset  . Depression Mother   . Bipolar disorder Mother   . Kidney disease Father     partial kidney fx  . COPD Father   . Pancreatic cancer Paternal Grandmother   . Celiac disease Paternal Grandmother   . Hypertension Paternal Grandmother   . Heart failure Paternal Grandfather   . Prostate cancer Other   . Bladder Cancer Neg Hx     Social History Social History  Substance Use Topics  . Smoking status: Current Some Day Smoker    Packs/day: 1.00    Years: 10.00    Types: Cigarettes  . Smokeless tobacco: Never Used  . Alcohol use Yes     Comment: 1 to 2 drinks monthly    Review of Systems Constitutional: Positive for fever/chills Eyes: No visual changes. ENT: No sore throat. Cardiovascular: Denies chest pain. Respiratory: Denies shortness of breath.Positive for cough Gastrointestinal: No abdominal pain.  Positive for vomiting and diarrhea  Genitourinary: Negative for dysuria. Musculoskeletal: Negative for back pain. Skin: Negative for rash. Neurological: Negative for headaches, focal weakness or numbness.  10-point ROS otherwise negative.  ____________________________________________   PHYSICAL EXAM:  VITAL SIGNS: ED Triage Vitals [06/21/16 2249]  Enc Vitals Group     BP 131/86     Pulse Rate (!) 132     Resp 18     Temp 98.4 F (36.9 C)     Temp Source Oral     SpO2 100 %     Weight 175 lb (79.4 kg)     Height 5\' 4"  (1.626 m)     Head Circumference      Peak Flow      Pain Score      Pain Loc      Pain Edu?      Excl. in [place]?     Constitutional: Alert and oriented. Well appearing and in no acute distress. Eyes: Conjunctivae are normal. PERRL. EOMI. Head: Atraumatic. Mouth/Throat: Mucous membranes are moist.  Oropharynx non-erythematous. Neck: No stridor.   Cardiovascular: Normal rate, regular rhythm. Good peripheral circulation. Grossly  normal heart sounds. Respiratory: Normal respiratory effort.  No retractions. Lungs CTAB. Gastrointestinal: Soft and nontender. No distention.  Musculoskeletal: No lower extremity tenderness nor edema. No gross deformities of extremities. Neurologic:  Normal speech and language. No gross focal neurologic deficits are appreciated.  Skin:  Skin is warm, dry and intact. No rash noted. Psychiatric: Mood and affect are normal. Speech and behavior are normal.  ____________________________________________   LABS (all labs ordered are listed, but only abnormal results are displayed)  Labs Reviewed  BASIC METABOLIC PANEL - Abnormal; Notable for the following:       Result Value   Glucose, Bld 118 (*)    Calcium 8.6 (*)    All other components within  normal limits  URINALYSIS, COMPLETE (UACMP) WITH MICROSCOPIC - Abnormal; Notable for the following:    Color, Urine STRAW (*)    APPearance CLEAR (*)    Hgb urine dipstick MODERATE (*)    Squamous Epithelial / LPF 6-30 (*)    All other components within normal limits  CBC  TROPONIN I  GLUCOSE, CAPILLARY  INFLUENZA PANEL BY PCR (TYPE A & B)  TSH  POC URINE PREG, ED   ____________________________________________  EKG  ED ECG REPORT I, [place] N Maycel Riffe, the attending physician, personally viewed and interpreted this ECG.   Date: 06/22/2016  EKG Time: 10:56 PM  Rate: 126  Rhythm: Sinus tachycardia  Axis: Normal  Intervals: Normal  ST&T Change: None   Procedures     INITIAL IMPRESSION / ASSESSMENT AND PLAN / ED COURSE  Pertinent labs & imaging results that were available during my care of the patient were reviewed by me and considered in my medical decision making (see chart for details).   31 year old female presenting to the emergency department 3 month history of nausea vomiting diarrhea. Physical exam unremarkable. Laboratory data unremarkable. We'll refer the patient to Dr. Allen Norris gastroenterologist for outpatient  evaluation chronic diarrhea     ____________________________________________  FINAL CLINICAL IMPRESSION(S) / ED DIAGNOSES  Final diagnoses:  Vomiting and diarrhea     MEDICATIONS GIVEN DURING THIS VISIT:  Medications - No data to display   NEW OUTPATIENT MEDICATIONS STARTED DURING THIS VISIT:  New Prescriptions   No medications on file    Modified Medications   No medications on file    Discontinued Medications   No medications on file     Note:  This document was prepared using Dragon voice recognition software and may include unintentional dictation errors.    Gregor Hams, MD 06/22/16 [phone]

## 2016-06-30 ENCOUNTER — Encounter: Payer: Self-pay | Admitting: Urology

## 2016-06-30 ENCOUNTER — Ambulatory Visit: Payer: Self-pay | Admitting: Urology

## 2016-06-30 NOTE — Progress Notes (Deleted)
0   06/30/2016 8:13 AM   Bethany Little [birth year] [id number]  Referring provider: Steele Sizer, MD [address] [place], [place] [address]  No chief complaint on file.   HPI: Patient is a 31 -year-old Caucasian female who presents today to for a three month follow up for recurrent UTI's and cystitis.    Recurrent UTI's Patient was referred to [place] by, Dr. Ancil Boozer, for recurrent urinary tract infections.  Reviewing her records,  she has had two documented UTI's with E. Coli in 2016.  Recent UTI on 04/13/2016 positive for proteus miralbilis.    Risk factors for UTI's consist of alternating constipation and diarrhea due to IBS, incontinence, limited water intake and soda consumption.  She underwent a contrast study on 12/26/2015 it was a negative study.  Renal ultrasound completed on 03/23/2016 was normal.  At her visit 3 months ago, she was encouraged to increase her water consumption and eliminate soda's from her diet.  We also reviewed UTI prevention strageties.    Cystitis She is having pain with bladder filling.  She underwent a contrast study on 12/26/2015 it was a negative study.  She is not drinking a lot of water daily.  She is drinking 3 cans of [employer] daily.  She is smoking half a pack a day of cigarettes and some marijuana use.  She is experiencing 10-15 trips to the bathroom during the day. She states that she suffers from insomnia, so she is voiding during the night. She had been on Elmiron and Vesicare without relief of her symptoms.  Her PVRs are minimal.  At her visit 3 months ago, she was encouraged to stop smoking, increase water intake and eliminate sodas from her diet.     PMH: Past Medical History:  Diagnosis Date  . Anxiety   . Arthritis   . Cervical abnormality   . Cervical cancer ([place]) 2003  . Depression   . Diabetes mellitus without complication ([place])   . Heartburn   . Hypertension   . Interstitial cystitis   . Leukemia ([place])    Remission for 23 yrs  . Seizures ([place])   . Sleep apnea     Surgical History: Past Surgical History:  Procedure Laterality Date  . CARDIAC CATHETERIZATION     46 weeks old  . CESAREAN SECTION    . LEEP    . TUBAL LIGATION      Home Medications:  Allergies as of 06/30/2016      Reactions   Tetracycline Hcl Hives   Childhood allergy   Erythromycin Hives   Childhood allergy   Pertussis Vaccines Hives   Childhood allergy   Reglan [metoclopramide] Other (See Comments)   Muscle Spasms   Tetracyclines & Related Hives   Childhood allergy   Latex Itching, Rash      Medication List       Accurate as of 06/30/16  8:13 AM. Always use your most recent med list.          amoxicillin-clavulanate 875-125 MG tablet Commonly known as:  AUGMENTIN Take 1 tablet by mouth every 12 (twelve) hours.   amphetamine-dextroamphetamine 20 MG 24 hr capsule Commonly known as:  ADDERALL XR Take 20 mg by mouth.   cetirizine 10 MG tablet Commonly known as:  ZYRTEC Take 10 mg by mouth daily.   cyclobenzaprine 10 MG tablet Commonly known as:  FLEXERIL Take 10 mg by mouth 3 (three) times daily as needed for muscle spasms.   dicyclomine 10  MG capsule Commonly known as:  BENTYL Take 10 mg by mouth daily.   diphenhydrAMINE 25 mg capsule Commonly known as:  BENADRYL Take 25 mg by mouth.   hydrochlorothiazide 12.5 MG tablet Commonly known as:  HYDRODIURIL Take 12.5 mg by mouth.   HYDROcodone-acetaminophen 5-325 MG tablet Commonly known as:  NORCO Take 2 tablets by mouth every 6 (six) hours as needed for moderate pain.   lamoTRIgine 25 MG tablet Commonly known as:  LAMICTAL Wk1 25mg PMwk2 25mg 2xdaywk3 25AM50PMwk4 502xdaywk5 50AM75PMwk6 752xdaywk7 75AM100PMwk8 1002xdaywk9 100AM125PMwk10 1252xday   naproxen 500 MG tablet Commonly known as:  NAPROSYN Take 500 mg by mouth 2 (two) times daily with a meal.   ondansetron 4 MG tablet Commonly known as:  ZOFRAN Take 8 mg by mouth daily as needed  for nausea or vomiting.   pentosan polysulfate 100 MG capsule Commonly known as:  ELMIRON Take 100 mg by mouth 3 (three) times daily.   ranitidine 150 MG tablet Commonly known as:  ZANTAC Take 150 mg by mouth daily.   solifenacin 5 MG tablet Commonly known as:  VESICARE Take 5 mg by mouth.   SUMAtriptan 100 MG tablet Commonly known as:  IMITREX Take 100 mg by mouth every 2 (two) hours as needed for migraine or headache. May repeat in 2 hours if headache persists or recurs.   traZODone 150 MG tablet Commonly known as:  DESYREL Take 150 mg by mouth.   venlafaxine 75 MG tablet Commonly known as:  EFFEXOR Take 75 mg by mouth.   zolpidem 5 MG tablet Commonly known as:  AMBIEN Take 5 mg by mouth at bedtime as needed for sleep.       Allergies:  Allergies  Allergen Reactions  . Tetracycline Hcl Hives    Childhood allergy  . Erythromycin Hives    Childhood allergy  . Pertussis Vaccines Hives    Childhood allergy  . Reglan [Metoclopramide] Other (See Comments)    Muscle Spasms  . Tetracyclines & Related Hives    Childhood allergy  . Latex Itching and Rash    Family History: Family History  Problem Relation Age of Onset  . Depression Mother   . Bipolar disorder Mother   . Kidney disease Father     partial kidney fx  . COPD Father   . Pancreatic cancer Paternal Grandmother   . Celiac disease Paternal Grandmother   . Hypertension Paternal Grandmother   . Heart failure Paternal Grandfather   . Prostate cancer Other   . Bladder Cancer Neg Hx     Social History:  reports that she has been smoking Cigarettes.  She has a 10.00 pack-year smoking history. She has never used smokeless tobacco. She reports that she drinks alcohol. She reports that she uses drugs, including Marijuana.  ROS:                                        Physical Exam: LMP 06/21/2016   Constitutional: Well nourished. Alert and oriented, No acute distress. HEENT: [place]  AT, moist mucus membranes. Trachea midline, no masses. Cardiovascular: No clubbing, cyanosis, or edema. Respiratory: Normal respiratory effort, no increased work of breathing. GI: Abdomen is soft, non tender, non distended, no abdominal masses. Liver and spleen not palpable.  No hernias appreciated.  Stool sample for occult testing is not indicated.   GU: No CVA tenderness.  No bladder fullness or masses.  Skin: No rashes, bruises or suspicious lesions. Lymph: No cervical or inguinal adenopathy. Neurologic: Grossly intact, no focal deficits, moving all 4 extremities. Psychiatric: Normal mood and affect.  Laboratory Data: Lab Results  Component Value Date   WBC 7.9 06/21/2016   HGB 13.5 06/21/2016   HCT 38.6 06/21/2016   MCV 93.5 06/21/2016   PLT 266 06/21/2016    Lab Results  Component Value Date   CREATININE 0.83 06/21/2016    Lab Results  Component Value Date   TSH 1.398 06/21/2016     Lab Results  Component Value Date   AST 81 (H) 12/26/2015   Lab Results  Component Value Date   ALT 67 (H) 12/26/2015     Urinalysis ***  Pertinent Imaging: ***  Assessment & Plan:    1. Recurrent UTI's  - Reviewed UTI prevention techniques  - Asked the patient to contact us with symptoms of urinary tract infections             - Urinalysis, Complete   2. Cystitis  - offered behavioral therapies - encouraged patient to stop smoking as it is a bladder irritant  - bladder training, bladder control strategies and pelvic floor muscle training- will refer patient to PT-her insurance will only pay for one visit  - fluid management - encouraged to increase her water consumption to 10 to 12 cups of water daily and eliminate Mountain Dew from her diet  - BLADDER SCAN AMB NON-IMAGING  3. Right flank pain  - no etiology found for flank pain   No Follow-up on file.  These notes generated with voice recognition software. I apologize for typographical errors.  Zara Council,  [facility] [address], [place], [place] [address] [phone]

## 2016-11-09 ENCOUNTER — Other Ambulatory Visit: Payer: Self-pay | Admitting: Family Medicine

## 2016-11-09 DIAGNOSIS — R928 Other abnormal and inconclusive findings on diagnostic imaging of breast: Secondary | ICD-10-CM

## 2016-11-26 ENCOUNTER — Other Ambulatory Visit: Payer: Self-pay | Admitting: Family Medicine

## 2016-11-26 DIAGNOSIS — R928 Other abnormal and inconclusive findings on diagnostic imaging of breast: Secondary | ICD-10-CM

## 2016-12-02 ENCOUNTER — Ambulatory Visit
Admission: RE | Admit: 2016-12-02 | Discharge: 2016-12-02 | Disposition: A | Discharge status: Home / Self Care | Payer: Medicaid Other | Source: Ambulatory Visit | Attending: Family Medicine | Admitting: Family Medicine

## 2016-12-02 DIAGNOSIS — R928 Other abnormal and inconclusive findings on diagnostic imaging of breast: Secondary | ICD-10-CM

## 2018-04-26 DIAGNOSIS — R4586 Emotional lability: Secondary | ICD-10-CM | POA: Insufficient documentation

## 2018-04-26 DIAGNOSIS — R413 Other amnesia: Secondary | ICD-10-CM | POA: Insufficient documentation

## 2018-05-31 DIAGNOSIS — G8929 Other chronic pain: Secondary | ICD-10-CM | POA: Insufficient documentation

## 2018-06-06 ENCOUNTER — Other Ambulatory Visit: Payer: Self-pay | Admitting: Acute Care

## 2018-06-06 DIAGNOSIS — R51 Headache: Principal | ICD-10-CM

## 2018-06-06 DIAGNOSIS — R519 Headache, unspecified: Secondary | ICD-10-CM

## 2018-06-17 ENCOUNTER — Ambulatory Visit
Admission: RE | Admit: 2018-06-17 | Discharge: 2018-06-17 | Disposition: A | Discharge status: Home / Self Care | Payer: Medicaid Other | Source: Ambulatory Visit | Attending: Acute Care | Admitting: Acute Care

## 2018-06-17 DIAGNOSIS — G8929 Other chronic pain: Secondary | ICD-10-CM

## 2018-06-17 DIAGNOSIS — R51 Headache: Secondary | ICD-10-CM | POA: Diagnosis present

## 2018-06-21 NOTE — Progress Notes (Signed)
[place] MRN: [id number] DOB: [birth year]  Referring provider: Beverlyn Roux, MD Primary care provider: Steele Sizer, MD  Reason for consult:  headache  HISTORY OF PRESENT ILLNESS: Bethany Little is a 33 year old right-handed woman with hypertension, chronic pain, depression, tobacco use disorder and history of pseudoseizure, cervical cancer and leukemia who presents for headaches.  She is accompanied by her husband who supplements history.  History supplemented by prior neurologist and referring provider notes.  Onset:  She has prior history of episodic migraines but they became frequent and intractable in June 2019.  No known preceding event.  Started taking pain relievers and headaches gradually got worse. Location:  Right sided (frontal/periorbital and temple)  Quality:  Throbbing Intensity:  Severe  It may wake her up at night.  She denies new headache, thunderclap headache  Aura:  No Prodrome:  No Postdrome:  fatigue Associated symptoms:  Nausea, vomiting, photophobia, phonophobia, blurred vision, eye lacrimation, right frontal swelling.  The pain causes her to feel confused and irritable.  She denies associated unilateral numbness or weakness. Duration:  Constant back round headache.  Flare-ups last several hours (over 4 hours) Frequency: Daily Frequency of abortive medication: Takes Fioricet 4-5 days a week.   Triggers: Unknown Exacerbating factors: Closing her eyes, light, sound Relieving factors:  Just resting Activity:  aggravates  MRI of brain without contrast from 06/17/18 personally reviewed and was normal.  Current NSAIDS: none Current analgesics: Fioricet Current triptans:  None Current ergotamine:  None Current anti-emetic: Zofran 4 mg Current muscle relaxants:  Flexeril 10mg  Current anti-anxiolytic:  None Current sleep aide: Trazodone Current Antihypertensive medications: none Current Antidepressant medications:   None Current Anticonvulsant medications:  topiramate 100mg  twice daily, lamotrigine 150mg  twice daily (for mood) Current anti-CGRP:  Ajovy (started earlier this month) Current Vitamins/Herbal/Supplements: B12 Current Antihistamines/Decongestants:  Benadryl Other therapy:  None Hormone/birth control: No  Past NSAIDS:  Naproxen, ibuprofen Past analgesics:  Excedrin, Tylenol Past abortive triptans:  Maxalt, sumatriptan 100mg  Past abortive ergotamine:  None Past muscle relaxants:  None Past anti-emetic:  None Past antihypertensive medications:  None Past antidepressant medications:  Nortriptyline (irritability) Past anticonvulsant medications:  None Past anti-CGRP:  None Past vitamins/Herbal/Supplements:  None Past antihistamines/decongestants:  None Other past therapies:  Migraine cocktails (decreased intensity but not abort)  Caffeine:  No coffee.  Drinks Dr. Malachi Bonds and tea daily Diet:  Drinks about 40 oz water daily.  Poor appetite due to nausea and vomiting. Exercise:  No Depression:  no; Anxiety:  no Other pain:  Diffuse pain Sleep hygiene:  Poor due to headaches Family history of headache:  On mother's side.  04/26/18 CMP unremarkable.  PAST MEDICAL HISTORY: Past Medical History:  Diagnosis Date  . Anxiety   . Arthritis   . Cervical abnormality   . Cervical cancer ([place]) 2003  . Depression   . Diabetes mellitus without complication ([place])   . Heartburn   . Hypertension   . Interstitial cystitis   . Leukemia ([place])    Remission for 23 yrs  . Seizures ([place])   . Sleep apnea     PAST SURGICAL HISTORY: Past Surgical History:  Procedure Laterality Date  . CARDIAC CATHETERIZATION     25 weeks old  . CESAREAN SECTION    . LEEP    . TUBAL LIGATION      MEDICATIONS: Current Outpatient Medications on File Prior to Visit  Medication Sig Dispense Refill  . amoxicillin-clavulanate (AUGMENTIN) 875-125 MG tablet Take  1 tablet by mouth every 12 (twelve) hours. 14 tablet 0   . amphetamine-dextroamphetamine (ADDERALL XR) 20 MG 24 hr capsule Take 20 mg by mouth.    . cetirizine (ZYRTEC) 10 MG tablet Take 10 mg by mouth daily.    . cyclobenzaprine (FLEXERIL) 10 MG tablet Take 10 mg by mouth 3 (three) times daily as needed for muscle spasms.    Marland Kitchen dicyclomine (BENTYL) 10 MG capsule Take 10 mg by mouth daily.    . diphenhydrAMINE (BENADRYL) 25 mg capsule Take 25 mg by mouth.    . hydrochlorothiazide (HYDRODIURIL) 12.5 MG tablet Take 12.5 mg by mouth.    Marland Kitchen HYDROcodone-acetaminophen (NORCO) 5-325 MG tablet Take 2 tablets by mouth every 6 (six) hours as needed for moderate pain. (Patient not taking: Reported on 03/30/2016) 6 tablet 0  . lamoTRIgine (LAMICTAL) 25 MG tablet Wk1 25mg PMwk2 25mg 2xdaywk3 25AM50PMwk4 502xdaywk5 50AM75PMwk6 752xdaywk7 75AM100PMwk8 1002xdaywk9 100AM125PMwk10 1252xday    . naproxen (NAPROSYN) 500 MG tablet Take 500 mg by mouth 2 (two) times daily with a meal.    . ondansetron (ZOFRAN) 4 MG tablet Take 8 mg by mouth daily as needed for nausea or vomiting.    . pentosan polysulfate (ELMIRON) 100 MG capsule Take 100 mg by mouth 3 (three) times daily.     . ranitidine (ZANTAC) 150 MG tablet Take 150 mg by mouth daily.    . solifenacin (VESICARE) 5 MG tablet Take 5 mg by mouth.    . SUMAtriptan (IMITREX) 100 MG tablet Take 100 mg by mouth every 2 (two) hours as needed for migraine or headache. May repeat in 2 hours if headache persists or recurs.    . traZODone (DESYREL) 150 MG tablet Take 150 mg by mouth.    . venlafaxine (EFFEXOR) 75 MG tablet Take 75 mg by mouth.    . zolpidem (AMBIEN) 5 MG tablet Take 5 mg by mouth at bedtime as needed for sleep.     No current facility-administered medications on file prior to visit.     ALLERGIES: Allergies  Allergen Reactions  . Tetracycline Hcl Hives    Childhood allergy  . Erythromycin Hives    Childhood allergy  . Pertussis Vaccines Hives    Childhood allergy  . Reglan [Metoclopramide] Other (See  Comments)    Muscle Spasms  . Tetracyclines & Related Hives    Childhood allergy  . Latex Itching and Rash    FAMILY HISTORY: Family History  Problem Relation Age of Onset  . Depression Mother   . Bipolar disorder Mother   . Kidney disease Father        partial kidney fx  . COPD Father   . Pancreatic cancer Paternal Grandmother   . Celiac disease Paternal Grandmother   . Hypertension Paternal Grandmother   . Heart failure Paternal Grandfather   . Prostate cancer Other   . Bladder Cancer Neg Hx    SOCIAL HISTORY: Social History   Socioeconomic History  . Marital status: Divorced    Spouse name: Not on file  . Number of children: Not on file  . Years of education: Not on file  . Highest education level: Not on file  Occupational History  . Not on file  Social Needs  . Financial resource strain: Not on file  . Food insecurity:    Worry: Not on file    Inability: Not on file  . Transportation needs:    Medical: Not on file    Non-medical: Not on file  Tobacco  Use  . Smoking status: Current Some Day Smoker    Packs/day: 1.00    Years: 10.00    Pack years: 10.00    Types: Cigarettes  . Smokeless tobacco: Never Used  Substance and Sexual Activity  . Alcohol use: Yes    Comment: 1 to 2 drinks monthly  . Drug use: Yes    Types: Marijuana  . Sexual activity: Yes    Birth control/protection: Surgical  Lifestyle  . Physical activity:    Days per week: Not on file    Minutes per session: Not on file  . Stress: Not on file  Relationships  . Social connections:    Talks on phone: Not on file    Gets together: Not on file    Attends religious service: Not on file    Active member of club or organization: Not on file    Attends meetings of clubs or organizations: Not on file    Relationship status: Not on file  . Intimate partner violence:    Fear of current or ex partner: Not on file    Emotionally abused: Not on file    Physically abused: Not on file    Forced  sexual activity: Not on file  Other Topics Concern  . Not on file  Social History Narrative  . Not on file    REVIEW OF SYSTEMS: Constitutional: No fevers, chills, or sweats, no generalized fatigue, change in appetite Eyes: No visual changes, double vision, eye pain Ear, nose and throat: No hearing loss, ear pain, nasal congestion, sore throat Cardiovascular: No chest pain, palpitations Respiratory:  No shortness of breath at rest or with exertion, wheezes GastrointestinaI: Nausea and vomiting Genitourinary:  No dysuria, urinary retention or frequency Musculoskeletal:  Neck pain, back pain Integumentary: No rash, pruritus, skin lesions Neurological: as above Psychiatric: depression, insomnia, anxiety Endocrine: No palpitations, fatigue, diaphoresis, mood swings, change in appetite, change in weight, increased thirst Hematologic/Lymphatic:  No purpura, petechiae. Allergic/Immunologic: no itchy/runny eyes, nasal congestion, recent allergic reactions, rashes  PHYSICAL EXAM: Blood pressure 104/70, pulse 91, height 5\' 4"  (1.626 m), weight 175 lb (79.4 kg), SpO2 97 %. General: No acute distress.  Patient appears well-groomed.   Head:  Normocephalic/atraumatic Eyes:  fundi examined but not visualized Neck: supple, no paraspinal tenderness, full range of motion Back: No paraspinal tenderness Heart: regular rate and rhythm Lungs: Clear to auscultation bilaterally. Vascular: No carotid bruits. Neurological Exam: Mental status: alert and oriented to person, place, and time, recent and remote memory intact, fund of knowledge intact, attention and concentration intact, speech fluent and not dysarthric, language intact. Cranial nerves: CN I: not tested CN II: pupils equal, round and reactive to light, visual fields intact CN III, IV, VI:  full range of motion, no nystagmus, no ptosis CN V: facial sensation intact CN VII: upper and lower face symmetric CN VIII: hearing intact CN IX, X: gag  intact, uvula midline CN XI: sternocleidomastoid and trapezius muscles intact CN XII: tongue midline Bulk & Tone: normal, no fasciculations. Motor:  5/5 throughout  Sensation:  temperature and vibration sensation intact.   Deep Tendon Reflexes:  2+ throughout, toes downgoing.   Finger to nose testing:  Without dysmetria.   Heel to shin:  Without dysmetria.   Gait:  Normal station and stride.  Able to turn and tandem walk. Romberg negative.  IMPRESSION: 1.  Chronic migraine without aura, without status migrainosus, intractable.  Also consider hemicrania continua (given associated autonomic symptoms) 2.  Depression  and anxiety 3.  Tobacco use disorder  PLAN: 1.  Continue Ajovy 2.  STOP FIORICET.  Instead, I will have her try sumatriptan 6mg  injection if we can get it approved.  I explained it will not likely completely abort the headache at this point but hopefully reduce intensity back to baseline headache. 3.  Limit use of pain relievers to no more than 2 days out of week to prevent risk of rebound or medication-overuse headache. 4.  Keep headache diary 5.  Lifestyle modification: Exercise, hydration, caffeine cessation, not to skip meals. 6.  Provided information on sleep hygiene 7.  Consider vitamins/supplements: Magnesium citrate, riboflavin, Coenzyme Q10 8.  Tobacco cessation counseling (CPT 99406):  Tobacco use with history of cancer  - Currently smoking 1 packs/day   - Patient was informed of the dangers of tobacco abuse including stroke, cancer, and MI, as well as benefits of tobacco cessation. - Patient is not willing to quit at this time. - Approximately 4 mins were spent counseling patient cessation techniques. We discussed various methods to help quit smoking, including deciding on a date to quit, joining a support group, pharmacological agents- nicotine gum/patch/lozenges, chantix.  - I will reassess her progress at the next follow-up visit 9.  Follow up in 4  months.  Thank you for allowing me to take part in the care of this patient.  Metta Clines, DO  CC: Beverlyn Roux, MD  Steele Sizer, MD

## 2018-06-22 ENCOUNTER — Encounter: Payer: Self-pay | Admitting: Neurology

## 2018-06-22 ENCOUNTER — Ambulatory Visit: Payer: Medicaid Other | Admitting: Neurology

## 2018-06-22 VITALS — BP 104/70 | HR 91 | Ht 64.0 in | Wt 175.0 lb

## 2018-06-22 DIAGNOSIS — F17200 Nicotine dependence, unspecified, uncomplicated: Secondary | ICD-10-CM

## 2018-06-22 DIAGNOSIS — G43719 Chronic migraine without aura, intractable, without status migrainosus: Secondary | ICD-10-CM

## 2018-06-22 MED ORDER — SUMATRIPTAN SUCCINATE 6 MG/0.5ML ~~LOC~~ SOLN
6.0000 mg | SUBCUTANEOUS | 3 refills | Status: DC | PRN
Start: 2018-06-22 — End: 2020-08-25

## 2018-06-22 NOTE — Patient Instructions (Signed)
Migraine Recommendations: 1.  Continue the monthly Ajovy shot 2.  STOP FIORICET.  I will try to get sumatriptan injection approved.  Take 1 injection at earliest onset of headache.  May repeat dose once in 1 hour if needed.  Do not exceed two injections in 24 hours. 3.  Limit use of pain relievers to no more than 2 days out of the week.  These medications include acetaminophen, ibuprofen, triptans and narcotics.  This will help reduce risk of rebound headaches. 4.  Be aware of common food triggers such as processed sweets, processed foods with nitrites (such as deli meat, hot dogs, sausages), foods with MSG, alcohol (such as wine), chocolate, certain cheeses, certain fruits (dried fruits, bananas, pineapple), vinegar, diet soda. 4.  Avoid caffeine, quit smoking 5.  Routine exercise 6.  Proper sleep hygiene 7.  Stay adequately hydrated with water 8.  Keep a headache diary. 9.  Maintain proper stress management. 10.  Do not skip meals. 11.  Consider supplements:  Magnesium citrate 400mg  to 600mg  daily, riboflavin 400mg , Coenzyme Q 10 100mg  three times daily 12.  Follow up in 4 months.   Migraine Headache  A migraine headache is a very strong throbbing pain on one side or both sides of your head. Migraines can also cause other symptoms. Talk with your doctor about what things may bring on (trigger) your migraine headaches. Follow these instructions at home: Medicines  Take over-the-counter and prescription medicines only as told by your doctor.  Do not drive or use heavy machinery while taking prescription pain medicine.  To prevent or treat constipation while you are taking prescription pain medicine, your doctor may recommend that you: ? Drink enough fluid to keep your pee (urine) clear or pale yellow. ? Take over-the-counter or prescription medicines. ? Eat foods that are high in fiber. These include fresh fruits and vegetables, whole grains, and beans. ? Limit foods that are high in  fat and processed sugars. These include fried and sweet foods. Lifestyle  Avoid alcohol.  Do not use any products that contain nicotine or tobacco, such as cigarettes and e-cigarettes. If you need help quitting, ask your doctor.  Get at least 8 hours of sleep every night.  Limit your stress. General instructions   Keep a journal to find out what may bring on your migraines. For example, write down: ? What you eat and drink. ? How much sleep you get. ? Any change in what you eat or drink. ? Any change in your medicines.  If you have a migraine: ? Avoid things that make your symptoms worse, such as bright lights. ? It may help to lie down in a dark, quiet room. ? Do not drive or use heavy machinery. ? Ask your doctor what activities are safe for you.  Keep all follow-up visits as told by your doctor. This is important. Contact a doctor if:  You get a migraine that is different or worse than your usual migraines. Get help right away if:  Your migraine gets very bad.  You have a fever.  You have a stiff neck.  You have trouble seeing.  Your muscles feel weak or like you cannot control them.  You start to lose your balance a lot.  You start to have trouble walking.  You pass out (faint). This information is not intended to replace advice given to you by your health care provider. Make sure you discuss any questions you have with your health care provider. Document Released:  02/17/2008 Document Revised: 02/01/2018 Document Reviewed: 10/27/2015 Elsevier Interactive Patient Education  [employer].

## 2018-06-27 ENCOUNTER — Telehealth: Payer: Self-pay | Admitting: Neurology

## 2018-06-27 MED ORDER — SUMATRIPTAN SUCCINATE 6 MG/0.5ML ~~LOC~~ SOAJ: 6.0000 mg | SUBCUTANEOUS | 6 refills | Status: DC

## 2018-06-27 NOTE — Telephone Encounter (Signed)
Patient called regarding her switching from a Pill to a Migraine injection. She said she has not heard anything. She also has had a Migraine for 3 days. She said Insurance was requesting more Information from Dr. Tomi Likens. Please Call. Thanks

## 2018-06-27 NOTE — Telephone Encounter (Signed)
Called and spoke with Pt. The Pa was not started on 1/30, due to the pharmacy not receiving the Rx for sumatriptan injectable. I resent Rx. Pt said she was advised to stop Fioricet, but she may have to take one tonight with a Flexeril. She has been unable to keep food down.

## 2018-06-28 MED ORDER — ONDANSETRON 4 MG PO TBDP: 4.0000 mg | ORAL_TABLET | Freq: Three times a day (TID) | ORAL | 2 refills | Status: DC | PRN

## 2018-06-28 NOTE — Telephone Encounter (Signed)
Yes.  Zofran ODT 4mg 

## 2018-06-28 NOTE — Addendum Note (Signed)
Addended by: Clois Comber on: 06/28/2018 02:01 PM   Modules accepted: Orders

## 2018-06-28 NOTE — Telephone Encounter (Signed)
Called and spoke with Pt, advised of Zofran

## 2018-06-29 ENCOUNTER — Telehealth: Payer: Self-pay | Admitting: Neurology

## 2018-06-29 NOTE — Telephone Encounter (Signed)
Patient wants to speak to someone about the injection and her ins

## 2018-06-30 NOTE — Telephone Encounter (Signed)
Called and spoke with Pt. She said her pharmacy is waiting on PA from our office. I advised her I will look into why it has not been started.

## 2018-06-30 NOTE — Telephone Encounter (Signed)
Started PA with [place] [facility]

## 2018-07-04 ENCOUNTER — Telehealth: Payer: Self-pay | Admitting: Neurology

## 2018-07-04 NOTE — Telephone Encounter (Signed)
Patient called regarding her Prior Auth on her Injection. She said the Pharmacy called and told her that it had been 2 weeks and they were waiting on the Prior Auth and to follow up with our office. I told her that we were waiting on the [employer]. She said she has not had anything to take and she can't keep having these headaches and not have nothing to take. Thanks

## 2018-07-04 NOTE — Telephone Encounter (Signed)
I will call [place] [facility]

## 2018-07-06 ENCOUNTER — Telehealth: Payer: Self-pay | Admitting: Neurology

## 2018-07-06 NOTE — Telephone Encounter (Signed)
Patient called and she is needing her Medication approved. She said that the [facility] Provider Is [phone] [id number] and the Reference # [id number]. She said it is needing to Re submitted to be approved for the Pharmacy. She said she has not been able to take anything. Her Medicaid ID # is [id number] R. Please Call. Thanks

## 2018-07-06 NOTE — Telephone Encounter (Signed)
Called [place] [facility], spoke with [place]. She states there is not a record of the fax that was sent to them for the sumatriptan injectables.  I started a new PA, I asked it be expedited. Can call back in 24 hours for determination. Case RAJ#[id number] Interaction ID# [id number]  Called and spoke with Pt, advised her. Will call [place] [facility] tomorrow for status

## 2018-07-07 NOTE — Telephone Encounter (Addendum)
Called [facility] [employer], spoke with [place]. Sumatriptan has approved 07/06/18 - 07/01/19  PA# [id number]  Called Pt and advised her she can fill Rx now.

## 2018-07-15 ENCOUNTER — Emergency Department (HOSPITAL_COMMUNITY)
Admission: EM | Admit: 2018-07-15 | Discharge: 2018-07-15 | Disposition: A | Discharge status: Home / Self Care | Payer: Medicaid Other | Attending: Emergency Medicine | Admitting: Emergency Medicine

## 2018-07-15 ENCOUNTER — Emergency Department (HOSPITAL_COMMUNITY): Payer: Medicaid Other

## 2018-07-15 ENCOUNTER — Encounter (HOSPITAL_COMMUNITY): Payer: Self-pay | Admitting: Emergency Medicine

## 2018-07-15 ENCOUNTER — Other Ambulatory Visit: Payer: Self-pay

## 2018-07-15 DIAGNOSIS — W000XXA Fall on same level due to ice and snow, initial encounter: Secondary | ICD-10-CM | POA: Diagnosis not present

## 2018-07-15 DIAGNOSIS — S76911A Strain of unspecified muscles, fascia and tendons at thigh level, right thigh, initial encounter: Secondary | ICD-10-CM | POA: Insufficient documentation

## 2018-07-15 DIAGNOSIS — I10 Essential (primary) hypertension: Secondary | ICD-10-CM | POA: Diagnosis not present

## 2018-07-15 DIAGNOSIS — S99911A Unspecified injury of right ankle, initial encounter: Secondary | ICD-10-CM | POA: Diagnosis present

## 2018-07-15 DIAGNOSIS — Z91040 Latex allergy status: Secondary | ICD-10-CM | POA: Diagnosis not present

## 2018-07-15 DIAGNOSIS — Y999 Unspecified external cause status: Secondary | ICD-10-CM | POA: Diagnosis not present

## 2018-07-15 DIAGNOSIS — W19XXXA Unspecified fall, initial encounter: Secondary | ICD-10-CM

## 2018-07-15 DIAGNOSIS — Y939 Activity, unspecified: Secondary | ICD-10-CM | POA: Diagnosis not present

## 2018-07-15 DIAGNOSIS — Y929 Unspecified place or not applicable: Secondary | ICD-10-CM | POA: Diagnosis not present

## 2018-07-15 DIAGNOSIS — F17210 Nicotine dependence, cigarettes, uncomplicated: Secondary | ICD-10-CM | POA: Insufficient documentation

## 2018-07-15 DIAGNOSIS — E119 Type 2 diabetes mellitus without complications: Secondary | ICD-10-CM | POA: Insufficient documentation

## 2018-07-15 DIAGNOSIS — Z79899 Other long term (current) drug therapy: Secondary | ICD-10-CM | POA: Insufficient documentation

## 2018-07-15 DIAGNOSIS — S93401A Sprain of unspecified ligament of right ankle, initial encounter: Secondary | ICD-10-CM | POA: Insufficient documentation

## 2018-07-15 MED ORDER — TRAMADOL HCL 50 MG PO TABS: 50.0000 mg | ORAL_TABLET | Freq: Four times a day (QID) | ORAL | 0 refills | Status: DC | PRN

## 2018-07-15 NOTE — Discharge Instructions (Addendum)
Elevate and apply ice packs on and off to your ankle and thigh.  You may use the crutches as needed for support and weightbearing.  Wear your ankle splint for at least 1 week.  You may call Dr. Ruthe Mannan office at the number listed to arrange a follow-up appointment in 1 week if not improving.

## 2018-07-15 NOTE — ED Triage Notes (Signed)
Pt reports slipping on ice yesterday and R side of leg hurts, pt put ASO on R ankle thinking it was swollen. Denies hitting her head or on blood thinners.

## 2018-07-18 NOTE — ED Provider Notes (Signed)
[facility] ([place] EMERGENCY DEPARTMENT Provider Note   CSN: [id number] Arrival date & time: 07/15/18  1641    History   Chief Complaint Chief Complaint  Patient presents with  . Ankle Pain    HPI Bethany Little is a 33 y.o. female.     HPI    Bethany Little is a 33 y.o. female who presents to the Emergency Department complaining of right ankle pain and right lateral thigh pain.  She describes a mechanical fall that occurred after slipping on some ice.  states that she landed on her right leg with her lower leg "bent" behind her.  She describes immediate swelling of her ankle and she applied ice. She describes pain with weight bearing.  She denies back pain, head injury, LOC and neck pain.  No numbness or weakness of the lower extremities.    Past Medical History:  Diagnosis Date  . Anxiety   . Arthritis   . Cervical abnormality   . Cervical cancer ([place]) 2003  . Depression   . Diabetes mellitus without complication ([place])   . Heartburn   . Hypertension   . Interstitial cystitis   . Leukemia ([place])    Remission for 23 yrs  . Seizures ([place])   . Sleep apnea     Patient Active Problem List   Diagnosis Date Noted  . Seizure ([place]) 03/22/2016  . 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 Unspecified depressive disorder. 12/21/2015  . Cannabis use disorder, moderate, dependence ([place]) 12/21/2015  . Abnormal uterine bleeding 09/30/2015  . Chronic interstitial cystitis 09/30/2015  . Chronic pelvic pain in female 09/30/2015  . Cystitis, interstitial 01/08/2015  . Elevated blood pressure 04/06/2012  . Leukemia ([place]) 04/04/2012  . Amitriptyline overdose 04/03/2012  . Suicidal intent 04/03/2012  . 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 CARDIAC CATHETERIZATION     56 weeks old  . CESAREAN SECTION    . LEEP    . TUBAL LIGATION       OB History   No obstetric history on file.      Home Medications    Prior to  Admission medications   Medication Sig Start Date End Date Taking? Authorizing Provider  cyclobenzaprine (FLEXERIL) 10 MG tablet Take 10 mg by mouth 3 (three) times daily as needed for muscle spasms.    [provider]  diphenhydrAMINE (BENADRYL) 25 mg capsule Take 25 mg by mouth.    [provider]  Fremanezumab-vfrm 225 MG/1.5ML SOSY Inject 225 mg into the skin every 30 (thirty) days. 05/31/18   [provider]  lamoTRIgine (LAMICTAL) 25 MG tablet Wk1 25mg PMwk2 25mg 2xdaywk3 25AM50PMwk4 502xdaywk5 50AM75PMwk6 752xdaywk7 75AM100PMwk8 1002xdaywk9 100AM125PMwk10 1252xday 01/28/16   [provider]  ondansetron (ZOFRAN ODT) 4 MG disintegrating tablet Take 1 tablet (4 mg total) by mouth every 8 (eight) hours as needed for nausea or vomiting. 06/28/18   Tomi Likens, Adam R, DO  ondansetron (ZOFRAN) 4 MG tablet Take 8 mg by mouth daily as needed for nausea or vomiting.    [provider]  SUMAtriptan (IMITREX) 6 MG/0.5ML SOLN injection Inject 0.5 mLs (6 mg total) into the skin every 2 (two) hours as needed for migraine or headache. May repeat in 2 hours if headache persists or recurs. 06/22/18   Pieter Partridge, DO  SUMAtriptan 6 MG/0.5ML SOAJ Inject 6 mg into the skin as directed. May repeat in 2 hours PRN, no more than 2/24 06/27/18  Tomi Likens, Adam R, DO  topiramate (TOPAMAX) 100 MG tablet Take 100 mg by mouth 2 (two) times daily. 04/26/18   [provider]  traMADol (ULTRAM) 50 MG tablet Take 1 tablet (50 mg total) by mouth every 6 (six) hours as needed. 07/15/18   Kem Parkinson, PA-C    Family History Family History  Problem Relation Age of Onset  . Depression Mother   . Bipolar disorder Mother   . COPD Mother   . Hypertension Mother   . Mental illness Mother   . Kidney disease Father        partial kidney fx  . COPD Father   . Pancreatic cancer Paternal Grandmother   . Celiac disease Paternal Grandmother   . Hypertension Paternal Grandmother   . Heart  failure Paternal Grandfather   . Prostate cancer Other   . Bladder Cancer Neg Hx     Social History Social History   Tobacco Use  . Smoking status: Current Some Day Smoker    Packs/day: 1.00    Years: 10.00    Pack years: 10.00    Types: Cigarettes  . Smokeless tobacco: Never Used  Substance Use Topics  . Alcohol use: Yes    Comment: 1 to 2 drinks monthly  . Drug use: Yes    Types: Marijuana     Allergies   Tetracycline hcl; Erythromycin; Pertussis vaccines; Reglan [metoclopramide]; Tetracyclines & related; and Latex   Review of Systems Review of Systems  Constitutional: Negative for chills and fever.  Cardiovascular: Negative for chest pain.  Genitourinary: Negative for difficulty urinating and dysuria.  Musculoskeletal: Positive for arthralgias (right ankle pain and right thigh pain) and myalgias. Negative for back pain and neck pain.  Skin: Negative for color change and wound.  Neurological: Negative for dizziness, weakness, numbness and headaches.     Physical Exam Updated Vital Signs BP 122/82 (BP Location: Right Arm)   Pulse 99   Temp 98.4 F (36.9 C) (Temporal)   Resp 14   Ht 5\' 1"  (1.549 m)   Wt 79.4 kg   LMP 07/11/2018   SpO2 100%   BMI 33.07 kg/m   Physical Exam Vitals signs and nursing note reviewed.  Constitutional:      General: She is not in acute distress.    Appearance: Normal appearance. She is well-developed. She is not ill-appearing.  HENT:     Head: Atraumatic.  Neck:     Musculoskeletal: Normal range of motion. No muscular tenderness.  Cardiovascular:     Rate and Rhythm: Normal rate and regular rhythm.     Pulses: Normal pulses.  Pulmonary:     Effort: Pulmonary effort is normal.     Breath sounds: Normal breath sounds.  Chest:     Chest wall: No tenderness.  Abdominal:     Palpations: Abdomen is soft.     Tenderness: There is no abdominal tenderness.  Musculoskeletal:        General: Swelling, tenderness and signs of  injury present. No deformity.     Comments: ttp of the lateral right ankle.  No significant edema noted.  No bony deformity.  ttp of the anterolateral right thigh.  No edema.  Hip flexors and extensors intact  Skin:    General: Skin is warm.     Capillary Refill: Capillary refill takes less than 2 seconds.     Findings: No rash.  Neurological:     General: No focal deficit present.     Mental Status:  She is alert.     Sensory: No sensory deficit.     Motor: No weakness or abnormal muscle tone.      ED Treatments / Results  Labs (all labs ordered are listed, but only abnormal results are displayed) Labs Reviewed - No data to display  EKG None  Radiology No results found.  Procedures Procedures (including critical care time)  Medications Ordered in ED Medications - No data to display   Initial Impression / Assessment and Plan / ED Course  I have reviewed the triage vital signs and the nursing notes.  Pertinent labs & imaging results that were available during my care of the patient were reviewed by me and considered in my medical decision making (see chart for details).        Pt with likely sprain of the ankle.  Muscle strain of the thigh,  . NV intact.  aso brace applied.  She agrees to close orthopedic f/u in one week if needed.   Final Clinical Impressions(s) / ED Diagnoses   Final diagnoses:  Sprain of right ankle, unspecified ligament, initial encounter  Muscle strain of right thigh, initial encounter  Fall, initial encounter    ED Discharge Orders         Ordered    traMADol (ULTRAM) 50 MG tablet  Every 6 hours PRN,   Status:  Discontinued     07/15/18 1856    traMADol (ULTRAM) 50 MG tablet  Every 6 hours PRN     07/15/18 1948           Kem Parkinson, PA-C 07/18/18 2338    Nat Christen, MD 07/19/18 1119

## 2018-10-21 ENCOUNTER — Other Ambulatory Visit: Payer: Self-pay | Admitting: Neurology

## 2018-10-24 NOTE — Progress Notes (Addendum)
Virtual Visit via Telephone Note The purpose of this virtual visit is to provide medical care while limiting exposure to the novel coronavirus.    Consent was obtained for phone visit:  Yes.   Answered questions that patient had about telehealth interaction:  Yes.   I discussed the limitations, risks, security and privacy concerns of performing an evaluation and management service by telephone. I also discussed with the patient that there may be a patient responsible charge related to this service. The patient expressed understanding and agreed to proceed.  Pt location: Home Physician Location: office Name of referring provider:  Steele Sizer, MD I connected with .[place] at patients initiation/request on 10/26/2018 at 10:10 AM EDT by telephone and verified that I am speaking with the correct person using two identifiers.  Pt MRN:  [id number] Pt DOB:  1986-05-17   History of Present Illness: Bethany Little is a 33 year old right-handed woman with hypertension, chronic pain, depression, tobacco use disorder and history of pseudoseizure, cervical cancer and leukemia who follows up for migraines.   UPDATE: Doing well with Ajovy.  After taking sumatriptan injection, she has diarrhea.for the next 2-3 days. Intensity:  moderate Duration:  She wakes up with them.  It gradually fades away after a couple of hours after getting out of bed. Frequency:  Still daily when she wakes up in the morning. Frequency of abortive medication: usually does not use Sumatriptan Current NSAIDS: none Current analgesics: none Current triptans:  sumatriptan 6mg  [place] Current ergotamine:  None Current anti-emetic: Zofran 4 mg Current muscle relaxants:  Flexeril 10mg  Current anti-anxiolytic:  None Current sleep aide: Trazodone Current Antihypertensive medications: none Current Antidepressant medications:  None Current Anticonvulsant medications:  topiramate 100mg  twice daily, lamotrigine 150mg   twice daily (for mood) Current anti-CGRP:  Ajovy Current Vitamins/Herbal/Supplements: B12 Current Antihistamines/Decongestants:  Benadryl Other therapy:  None Hormone/birth control: No  Caffeine:  No coffee.  Drinks Dr. Malachi Bonds and tea daily Diet:  Drinks about 40 oz water daily.  Poor appetite due to nausea and vomiting. Exercise:  No Depression:  no; Anxiety:  no Other pain:  Diffuse pain Sleep hygiene:  Poor due to headaches.  Tired in the morning but more energy by the afternoon but fatigued again in evening.  HISTORY:  Onset:  She has prior history of episodic migraines but they became frequent and intractable in June 2019.  No known preceding event.  Started taking pain relievers and headaches gradually got worse. Location:  Right sided (frontal/periorbital and temple)  Quality:  Throbbing Initial intensity:  Severe  It may wake her up at night.  She denies new headache, thunderclap headache  Aura:  No Prodrome:  No Postdrome:  fatigue Associated symptoms: Nausea, vomiting, photophobia, phonophobia, blurred vision, eye lacrimation, right frontal swelling.  The pain causes her to feel confused and irritable.  She denies associated unilateral numbness or weakness. Initial duration:  Constant back round headache.  Flare-ups last several hours (over 4 hours) Initial Frequency: Daily Initial Frequency of abortive medication: Takes Fioricet 4-5 days a week.   Triggers: Unknown Exacerbating factors: Closing her eyes, light, sound Relieving factors: Rest Activity:  aggravates  MRI of brain without contrast from 06/17/18 personally reviewed and was normal.  Past NSAIDS:  Naproxen, ibuprofen Past analgesics:  Excedrin, Tylenol, Fioricet Past abortive triptans:  Maxalt, sumatriptan 100mg  Past abortive ergotamine:  None Past muscle relaxants:  None Past anti-emetic:  None Past antihypertensive medications:  None Past antidepressant medications:  Nortriptyline (irritability)  Past  anticonvulsant medications:  None Past anti-CGRP:  None Past vitamins/Herbal/Supplements:  None Past antihistamines/decongestants:  None Other past therapies:  Migraine cocktails (decreased intensity but not abort)  Family history of headache:  On mother's side.    Observations/Objective:   There were no vitals taken for this visit. Alert and oriented.  Speech fluent and not dysarthric.  Language intact.   Assessment and Plan:   Chronic migraine without aura, without status migrainosus, not intractable.  Improved Morning headaches  Daytime fatigue  1.  For preventative management, Ajovy monthly and topiramate 100mg  twice daily 2.  For abortive therapy, sumatriptan [place] 3.  Order sleep study to evaluate for obstructive sleep apnea as cause of morning headaches. 4.  Limit use of pain relievers to no more than 2 days out of week to prevent risk of rebound or medication-overuse headache. 5.  Keep headache diary 6.  Exercise, hydration, caffeine cessation, sleep hygiene, monitor for and avoid triggers 7.  Consider:  magnesium citrate 400mg  daily, riboflavin 400mg  daily, and coenzyme Q10 100mg  three times daily 8. Always keep in mind that currently taking a hormone or birth control may be a possible trigger or aggravating factor for migraine. 9. Follow up in 5 months.    Follow Up Instructions:    -I discussed the assessment and treatment plan with the patient. The patient was provided an opportunity to ask questions and all were answered. The patient agreed with the plan and demonstrated an understanding of the instructions.   The patient was advised to call back or seek an in-person evaluation if the symptoms worsen or if the condition fails to improve as anticipated.  Total time spent with patient:  15 minutes.   Dudley Major, DO

## 2018-10-26 ENCOUNTER — Telehealth (INDEPENDENT_AMBULATORY_CARE_PROVIDER_SITE_OTHER): Payer: Medicaid Other | Admitting: Neurology

## 2018-10-26 ENCOUNTER — Other Ambulatory Visit: Payer: Self-pay

## 2018-10-26 ENCOUNTER — Encounter: Payer: Self-pay | Admitting: Neurology

## 2018-10-26 DIAGNOSIS — R519 Headache, unspecified: Secondary | ICD-10-CM

## 2018-10-26 DIAGNOSIS — G43009 Migraine without aura, not intractable, without status migrainosus: Secondary | ICD-10-CM | POA: Diagnosis not present

## 2018-10-26 DIAGNOSIS — R5383 Other fatigue: Secondary | ICD-10-CM

## 2018-10-26 MED ORDER — ONDANSETRON 4 MG PO TBDP: 4.0000 mg | ORAL_TABLET | Freq: Three times a day (TID) | ORAL | 2 refills | Status: DC | PRN

## 2019-01-17 ENCOUNTER — Telehealth: Payer: Self-pay | Admitting: Neurology

## 2019-01-17 ENCOUNTER — Other Ambulatory Visit: Payer: Self-pay | Admitting: Neurology

## 2019-01-17 NOTE — Telephone Encounter (Signed)
Patient is needing a refill on the Ajovy medication. She is due for it today. It is needing a PA done. He was also supposed to extend the Zofran to a 30 days supply and not 20. Please send to the pharm on file. Thanks!

## 2019-01-18 ENCOUNTER — Other Ambulatory Visit: Payer: Self-pay | Admitting: Neurology

## 2019-01-18 MED ORDER — ONDANSETRON 4 MG PO TBDP: ORAL_TABLET | ORAL | 5 refills | Status: DC

## 2019-01-18 NOTE — Telephone Encounter (Signed)
Called patient and informed her that Dr. Tomi Likens resent the Zofran prescription for 30 days.   Also regarding the renewal of the Ajovy PA she will check with her pharmacy in next day or two  to see if it has gone through yet and if hasn't heard early next week will call us back to follow up. I have left message with Laureen Ochs who is doing the PAs for this med in our office to inform her of the need for renewal.

## 2019-01-18 NOTE — Telephone Encounter (Signed)
Dr Tomi Likens is it okay to extend Zofran to 30 day supply?  Or should she call back when she is close to being out of the 20?  Looks like you ordered the 20 tablets with 5 refills yesterday.   Laureen Ochs is doing the PA for Ajovy and sent her a message about the PA.

## 2019-01-18 NOTE — Progress Notes (Signed)
Resent Zofran for 30 tablets instead of 20 tablets.

## 2019-01-18 NOTE — Telephone Encounter (Signed)
Patient left msg with after hours about her injection. She was wanting an update. Thanks!

## 2019-01-18 NOTE — Telephone Encounter (Signed)
I resent the prescription for 30 tablets.

## 2019-01-22 ENCOUNTER — Telehealth: Payer: Self-pay | Admitting: Neurology

## 2019-01-22 MED ORDER — FREMANEZUMAB-VFRM 225 MG/1.5ML ~~LOC~~ SOSY: 225.0000 mg | PREFILLED_SYRINGE | SUBCUTANEOUS | 0 refills | Status: DC

## 2019-01-22 NOTE — Telephone Encounter (Signed)
Pt needs to know if we can send the [facility]  [phone]

## 2019-01-22 NOTE — Telephone Encounter (Signed)
Requested Prescriptions   Pending Prescriptions Disp Refills  . Fremanezumab-vfrm 225 MG/1.5ML SOSY 1.68 mL 0    Sig: Inject 225 mg into the skin every 30 (thirty) days.   Rx last filled:n/a  Pt last seen: 10/26/18  Follow up appt scheduled:03/29/19

## 2019-01-23 ENCOUNTER — Telehealth: Payer: Self-pay | Admitting: Neurology

## 2019-01-23 NOTE — Telephone Encounter (Signed)
Patient called and left a message requesting a call back about her "HOV" shot being due on 8/26-8/27/20.  Pharmacy: [facility]

## 2019-01-24 ENCOUNTER — Encounter: Payer: Self-pay | Admitting: *Deleted

## 2019-01-24 NOTE — Telephone Encounter (Signed)
Called patient she was informed that Rx for ajovy was sent to pharmacy on 01/22/19 @3 :00pm she will contact pharmacy to confirm and pick up today.

## 2019-01-24 NOTE — Telephone Encounter (Signed)
Noted Will give to Laureen Ochs to get approval

## 2019-01-24 NOTE — Telephone Encounter (Signed)
Patient called to let the medical assistant know her pharmacy is sending over a fax for prior authorization on the ajovy. She said her Medicaid is requiring this.

## 2019-01-24 NOTE — Telephone Encounter (Signed)
Patient called to check on the status of the request. Patient is aware we are working on it for her.

## 2019-01-24 NOTE — Progress Notes (Signed)
Called [place] [facility] [phone] to initiate PA Call reference [phone] [id number] in progress We can review status after 24 hours online or call Her medicaid ID is [id number] R  I then called [place] to let her know where this PA is in the process

## 2019-01-25 NOTE — Progress Notes (Signed)
Call reference# [id number] Approved Valid until 04/24/2019

## 2019-02-16 ENCOUNTER — Encounter: Payer: Self-pay | Admitting: *Deleted

## 2019-02-16 NOTE — Progress Notes (Addendum)
Bethany Little (Key: [id number]) - [id number] help? Call us at [phone] °Archivedtoday °Outcome °Approvedtoday °Drug °SUMAtriptan Succinate Refill 6MG/0.5ML cartridges °Form °[facility] Call-In Form °To initiate the prior authorization process for this medication, please contact [facility] at [phone]. °[phone] °Original Claim Info ° ° ° °Pharmacy contact person Dawn [phone] °There was a problem with the verbage but after many phone calls between [place] [facility] and the pharmacy there was an form of this injectable med that was approved. °It was a kit. °I called St[facility] and she was ok with the outcome. °It was 9 boxes of 2 prefilled syringes °Her copay is$3 ° °

## 2019-03-28 NOTE — Progress Notes (Signed)
Virtual Visit via Video Note The purpose of this virtual visit is to provide medical care while limiting exposure to the novel coronavirus.    Consent was obtained for video visit:  Yes.   Answered questions that patient had about telehealth interaction:  Yes.   I discussed the limitations, risks, security and privacy concerns of performing an evaluation and management service by telemedicine. I also discussed with the patient that there may be a patient responsible charge related to this service. The patient expressed understanding and agreed to proceed.  Pt location: Home Physician Location: office Name of referring provider:  Steele Sizer, MD I connected with Bethany Little at patients initiation/request on 03/29/2019 at  2:30 PM EST by video enabled telemedicine application and verified that I am speaking with the correct person using two identifiers. Pt MRN:  [id number] Pt DOB:  1985/09/29 Video Participants:  Bethany Little   History of Present Illness:   Bethany Little is a 33 year old right-handed woman withhypertension, chronic pain, depression,tobacco use disorderand history ofpseudoseizure, cervical cancer and leukemiawho follows up for migraines.   UPDATE: She was unable to get her Ajovy for 2 1/2 weeks.  Since restarting Ajovy, she had her second injection 2 weeks ago.  They have been daily.   Frequency of abortive medication: She takes sumatriptan injections 3 to 4 days a week.    Current NSAIDS:none Current analgesics:none Current triptans:sumatriptan 6mg  [place] Current ergotamine:None Current anti-emetic:Zofran 4 mg Current muscle relaxants:Flexeril 10mg  Current anti-anxiolytic:None Current sleep aide:Trazodone Current Antihypertensive medications:none Current Antidepressant medications:None.  She avoids antidepressants due to side effects to multiple antidepressants. Current Anticonvulsant medications:topiramate 100mg  twice daily,  lamotrigine 150mg  twice daily (for mood) Current anti-CGRP:Ajovy Current Vitamins/Herbal/Supplements:B12 Current Antihistamines/Decongestants:Benadryl Other therapy:None Hormone/birth control:No  Caffeine:No coffee. Drinks Dr. Malachi Bonds and tea daily Diet:Drinks about 40 oz water daily. Poor appetite due to nausea and vomiting. Exercise:No Depression:no; Anxiety:no Other pain:Diffuse pain Sleep hygiene:Poor due to headaches.  Tired in the morning but more energy by the afternoon but fatigued again in evening.  She is scheduled for a sleep study in December.  HISTORY:  Onset:She has prior history of episodic migraines but they became frequent and intractable in June 2019. No known preceding event. Started taking pain relievers and headaches gradually got worse. Location:Right sided (frontal/periorbital and temple) Quality:Throbbing Initial intensity:SevereIt may wake her up at night. Shedenies new headache, thunderclap headache  Aura:No Prodrome:No Postdrome:fatigue Associated symptoms: Nausea, vomiting,photophobia, phonophobia, blurred vision, eye lacrimation, right frontal swelling.The pain causes her to feel confused and irritable.Shedenies associated unilateral numbness or weakness. Initial duration:Constant back round headache. Flare-ups last several hours (over 4 hours) Initial Frequency:Daily Initial Frequency of abortive medication:Takes Fioricet 4-5 days a week. Triggers: Unknown Exacerbating factors:Closing her eyes, light, sound Relieving factors: Rest Activity:aggravates  MRI of brain without contrast from 06/17/18 personally reviewed and was normal.  Past NSAIDS:Naproxen, ibuprofen Past analgesics:Excedrin, Tylenol, Fioricet Past abortive triptans:Maxalt, sumatriptan 100mg  Past abortive ergotamine:None Past muscle relaxants:None Past anti-emetic:None Past antihypertensive medications:None  Past antidepressant medications:Nortriptyline (irritability); venlafaxine (fatigue, felt sick) Past anticonvulsant medications:None Past anti-CGRP:None Past vitamins/Herbal/Supplements:None Past antihistamines/decongestants:None Other past therapies:Migraine cocktails (decreased intensity but not abort)  Family history of headache:On mother's side.  Past Medical History: Past Medical History:  Diagnosis Date  . Anxiety   . Arthritis   . Cervical abnormality   . Cervical cancer ([place]) 2003  . Depression   . Diabetes mellitus without complication ([place])   . Heartburn   . Hypertension   . Interstitial  cystitis   . Leukemia ([place])    Remission for 23 yrs  . Seizures ([place])   . Sleep apnea     Medications: Outpatient Encounter Medications as of 03/29/2019  Medication Sig Note  . cyclobenzaprine (FLEXERIL) 10 MG tablet Take 10 mg by mouth 3 (three) times daily as needed for muscle spasms.   . diphenhydrAMINE (BENADRYL) 25 mg capsule Take 25 mg by mouth. 03/10/2016: Received from: [facility]: Take 25 mg by mouth every six (6) hours as needed for itching.  . Fremanezumab-vfrm 225 MG/1.5ML SOSY Inject 225 mg into the skin every 30 (thirty) days.   Marland Kitchen lamoTRIgine (LAMICTAL) 25 MG tablet Wk1 25mg PMwk2 25mg 2xdaywk3 25AM50PMwk4 502xdaywk5 50AM75PMwk6 752xdaywk7 75AM100PMwk8 1002xdaywk9 100AM125PMwk10 1252xday 03/10/2016: Received from: [facility]  . ondansetron (ZOFRAN) 4 MG tablet Take 8 mg by mouth daily as needed for nausea or vomiting.   . ondansetron (ZOFRAN-ODT) 4 MG disintegrating tablet TAKE 1 TABLET BY MOUTH EVERY 8 HOURS AS NEEDED FOR NAUSEA OR VOMITING   . SUMAtriptan (IMITREX) 6 MG/0.5ML SOLN injection Inject 0.5 mLs (6 mg total) into the skin every 2 (two) hours as needed for migraine or headache. May repeat in 2 hours if headache persists or recurs.   . SUMAtriptan 6 MG/0.5ML SOAJ Inject 6 mg into the skin as directed. May repeat in  2 hours PRN, no more than 2/24   . topiramate (TOPAMAX) 100 MG tablet Take 100 mg by mouth 2 (two) times daily.   . [DISCONTINUED] traMADol (ULTRAM) 50 MG tablet Take 1 tablet (50 mg total) by mouth every 6 (six) hours as needed. (Patient not taking: Reported on 03/29/2019)    No facility-administered encounter medications on file as of 03/29/2019.     Allergies: Allergies  Allergen Reactions  . Tetracycline Hcl Hives    Childhood allergy  . Erythromycin Hives    Childhood allergy  . Pertussis Vaccines Hives    Childhood allergy  . Reglan [Metoclopramide] Other (See Comments)    Muscle Spasms  . Tetracyclines & Related Hives    Childhood allergy  . Latex Itching and Rash    Family History: Family History  Problem Relation Age of Onset  . Depression Mother   . Bipolar disorder Mother   . COPD Mother   . Hypertension Mother   . Mental illness Mother   . Kidney disease Father        partial kidney fx  . COPD Father   . Pancreatic cancer Paternal Grandmother   . Celiac disease Paternal Grandmother   . Hypertension Paternal Grandmother   . Heart failure Paternal Grandfather   . Prostate cancer Other   . Healthy Son   . Bladder Cancer Neg Hx     Social History: Social History   Socioeconomic History  . Marital status: Divorced    Spouse name: Not on file  . Number of children: 1  . Years of education: Not on file  . Highest education level: Some college, no degree  Occupational History  . Occupation: unemployed  Social Needs  . Financial resource strain: Not on file  . Food insecurity    Worry: Not on file    Inability: Not on file  . Transportation needs    Medical: Not on file    Non-medical: Not on file  Tobacco Use  . Smoking status: Current Some Day Smoker    Packs/day: 1.00    Years: 10.00    Pack years: 10.00  Types: Cigarettes  . Smokeless tobacco: Never Used  Substance and Sexual Activity  . Alcohol use: Yes    Comment: 1 to 2 drinks monthly   . Drug use: Yes    Types: Marijuana  . Sexual activity: Yes    Birth control/protection: Surgical  Lifestyle  . Physical activity    Days per week: Not on file    Minutes per session: Not on file  . Stress: Not on file  Relationships  . Social Herbalist on phone: Not on file    Gets together: Not on file    Attends religious service: Not on file    Active member of club or organization: Not on file    Attends meetings of clubs or organizations: Not on file    Relationship status: Not on file  . Intimate partner violence    Fear of current or ex partner: Not on file    Emotionally abused: Not on file    Physically abused: Not on file    Forced sexual activity: Not on file  Other Topics Concern  . Not on file  Social History Narrative   Patient is right-handed. She lives with her boyfriend in a one level home. She drinks three glasses of tea and two 20 oz Dr. Lyndee Hensen a day. She does not exercise.    Observations/Objective:   Height 5\' 4"  (1.626 m), weight 165 lb (74.8 kg). No acute distress.  Alert and oriented.  Speech fluent and not dysarthric.  Language intact.  Eyes orthophoric on primary gaze.  Face symmetric.  Assessment and Plan:   Migraine without aura, without status migrainosus, not intractable Daytime fatigue  1.  For preventative management, continue Ajovy.  If no improvement by next visit, will look to change to Aimovig or Emgality 2.  For abortive therapy, sumatriptan 6mg  [place].  Advised to limit to no more than 2 days out of week to prevent risk of rebound or medication-overuse headache. 3.  Keep headache diary 4.  Exercise, hydration, caffeine cessation, sleep hygiene, monitor for and avoid triggers 5.  Consider:  magnesium citrate 400mg  daily, riboflavin 400mg  daily, and coenzyme Q10 100mg  three times daily 6. Follow up for sleep study. 7. Follow up in 3 to 4 months.   Follow Up Instructions:    -I discussed the assessment and treatment plan  with the patient. The patient was provided an opportunity to ask questions and all were answered. The patient agreed with the plan and demonstrated an understanding of the instructions.   The patient was advised to call back or seek an in-person evaluation if the symptoms worsen or if the condition fails to improve as anticipated.   Dudley Major, DO

## 2019-03-29 ENCOUNTER — Institutional Professional Consult (permissible substitution): Payer: Medicaid Other | Admitting: Internal Medicine

## 2019-03-29 ENCOUNTER — Ambulatory Visit (INDEPENDENT_AMBULATORY_CARE_PROVIDER_SITE_OTHER): Payer: Medicaid Other | Admitting: Neurology

## 2019-03-29 ENCOUNTER — Encounter: Payer: Self-pay | Admitting: Neurology

## 2019-03-29 ENCOUNTER — Other Ambulatory Visit: Payer: Self-pay

## 2019-03-29 VITALS — Ht 64.0 in | Wt 165.0 lb

## 2019-03-29 DIAGNOSIS — G43009 Migraine without aura, not intractable, without status migrainosus: Secondary | ICD-10-CM | POA: Diagnosis not present

## 2019-03-29 DIAGNOSIS — R5383 Other fatigue: Secondary | ICD-10-CM

## 2019-05-10 ENCOUNTER — Other Ambulatory Visit: Payer: Self-pay | Admitting: Neurology

## 2019-05-10 NOTE — Telephone Encounter (Signed)
Requested Prescriptions   Pending Prescriptions Disp Refills  . AJOVY 225 MG/1.5ML SOSY [Pharmacy Med Name: AJOVY 225 MG/1.5ML SUBQ SYRINGE ML] 1.5 mL     Sig: INJECT 225 MG SUBCUTANEOUSLY EVERY 28 DAYS   Rx last filled: /31/20 #1.8ml 0 refills  Pt last seen: 03/29/19   Follow up appt scheduled:07/27/2019

## 2019-05-10 NOTE — Telephone Encounter (Signed)
Spoke with patient she was made aware of this

## 2019-05-10 NOTE — Telephone Encounter (Signed)
Patient left msg with after hours about RX Ajovy injectable being sent to her pharm. She is scheduled to do it on the 21st and needing this PA- doesn't have any refills left. Sherian Rein is [employer] [phone]. Thanks!

## 2019-05-10 NOTE — Telephone Encounter (Signed)
Rx has been sent to pharmacy  Will renew PA today

## 2019-05-15 ENCOUNTER — Telehealth: Payer: Self-pay | Admitting: Neurology

## 2019-05-15 NOTE — Telephone Encounter (Signed)
Patient called in regarding her [place] and needing to have her injection before Christmas. She said she needed it by yesterday. She said that the order was placed last Thursday. Please Call. Thank you

## 2019-05-16 ENCOUNTER — Encounter: Payer: Self-pay | Admitting: *Deleted

## 2019-05-16 NOTE — Progress Notes (Signed)
Faxed form to Medicaid for continuation PA. Waiting for a response. Sent form to scan  Called 05/21/2019  InteractionID [id number] Approval valid 05/16/2019-05/10/2020  I called [facility] the above information

## 2019-05-21 ENCOUNTER — Telehealth: Payer: Self-pay | Admitting: Neurology

## 2019-05-21 NOTE — Telephone Encounter (Signed)
LMOM that her Ajovy was approved 05/16/2019-05/10/2020

## 2019-05-21 NOTE — Telephone Encounter (Signed)
Patient is calling in about the Ajovy PA- I let her know that is was sent into Medicaid for the PA continuation on 12/23. She is wondering if there is any update. She is suffering from migraines while waiting and just wanting to see if there is anything she can do to speed up the process. Thanks!

## 2019-05-21 NOTE — Progress Notes (Signed)
So after getting it approved Bethany Little called her pharmacy [phone] and they got another a rejection after running a claim. I called [place] [facility] again [phone] option1 and she said that the yearly total had been met 261m/ [facility] [phone]. So I called Bethany Little and relayed the reason it was getting rejected even though she had not received an injection in the entire month of December. They said for her to get it filled January 1 and call the above number if there are any problems.

## 2019-05-23 ENCOUNTER — Institutional Professional Consult (permissible substitution): Payer: Medicaid Other | Admitting: Internal Medicine

## 2019-05-28 ENCOUNTER — Telehealth: Payer: Self-pay | Admitting: Neurology

## 2019-05-28 NOTE — Telephone Encounter (Signed)
Have you seen anything on this?  

## 2019-05-28 NOTE — Telephone Encounter (Signed)
Patient is calling in that her pharm is telling her the Arie Sabina is not approved and is stating it has exceeded the amount. Please call her back. Thanks!

## 2019-05-28 NOTE — Telephone Encounter (Signed)
Dawn with [employer] called having questions about the AGOVY 225 mg per 1.5 ml   Reference number is [id number]  [place] spoke with Linus Orn  she states that the PA submitted the medication was as a unit 1 but,will only approve one box per year they will pay for 18 meals for 365 days she states a PA needs to change Dawn states the patient has used 12 meals but if changed it should go up 1/14 of this month

## 2019-07-02 ENCOUNTER — Telehealth: Payer: Self-pay | Admitting: Neurology

## 2019-07-02 ENCOUNTER — Other Ambulatory Visit: Payer: Self-pay | Admitting: Neurology

## 2019-07-02 NOTE — Telephone Encounter (Signed)
Pt advised.

## 2019-07-02 NOTE — Telephone Encounter (Signed)
Patient called in regarding her Ajovy medication and it needing a Prior Authorization. She said she uses [facility]. She is to take the injection every 28 days and she will need it this week. Please Call. Thank you

## 2019-07-03 ENCOUNTER — Other Ambulatory Visit: Payer: Self-pay | Admitting: Neurology

## 2019-07-04 NOTE — Telephone Encounter (Signed)
Spoke with Bethany Little at [facility] for her Bethany Little is fine until 05/05/20 they will refill it on 07/07/19 for her, pt was called no answer voice mail left for her to call back.

## 2019-07-09 ENCOUNTER — Telehealth: Payer: Self-pay | Admitting: Neurology

## 2019-07-09 ENCOUNTER — Encounter: Payer: Self-pay | Admitting: Neurology

## 2019-07-09 NOTE — Telephone Encounter (Signed)
Faxed [place] [employer] form - awaiting response

## 2019-07-20 NOTE — Telephone Encounter (Signed)
Called [place] [facility] call interaction ID [id number] PA# [id number] Approval valid 07/09/2019-07/03/2020

## 2019-07-26 NOTE — Progress Notes (Signed)
Due to the COVID-19 crisis, this virtual check-in visit was done via telephone from my office and it was initiated and consent given by this patient and or family.   Telephone (Audio) Visit The purpose of this telephone visit is to provide medical care while limiting exposure to the novel coronavirus.    Consent was obtained for telephone visit and initiated by pt/family:  Yes.   Answered questions that patient had about telehealth interaction:  Yes.   I discussed the limitations, risks, security and privacy concerns of performing an evaluation and management service by telephone. I also discussed with the patient that there may be a patient responsible charge related to this service. The patient expressed understanding and agreed to proceed.  Pt location: Home Physician Location: office Name of referring provider:  Steele Sizer, MD I connected with [name] at patients initiation/request on 07/27/2019 at  8:50 AM EST by telephone and verified that I am speaking with the correct person using two identifiers.  Pt MRN:  [id number] Pt DOB:  [birth year]  History of Present Illness:  Bethany Little is a 34 year old right-handed womanwithhypertension, chronic pain, depression,tobacco use disorderand history ofpseudoseizure, cervical cancer and leukemiawhofollows up for migraines.  UPDATE: Ajovy started working again.  She went without Ajovy from November 24 to January 14 because Medicaid stopped approving it.  She finally got it approved and migraines got worse.  Therefore, migraines were worse, occurring daily and she had to leave work for a short period of time.  So, she is scheduled for her third injection in the next week.   In February, she has a daily headache but has a severe headache towards the end of the month 1 to 2 times in a week.  If she takes sumatriptan it lasts 2 hours.   Current NSAIDS:none Current analgesics:none Current triptans:sumatriptan 6mg    Current ergotamine:None Current anti-emetic:Zofran 4 mg Current muscle relaxants:Flexeril 10mg  Current anti-anxiolytic:None Current sleep aide:Trazodone Current Antihypertensive medications:none Current Antidepressant medications:None.  She avoids antidepressants due to side effects to multiple antidepressants. Current Anticonvulsant medications:topiramate 100mg  twice daily, lamotrigine 150mg  twice daily (for mood) Current anti-CGRP:Ajovy Current Vitamins/Herbal/Supplements:B12 Current Antihistamines/Decongestants:Benadryl Other therapy:None Hormone/birth control:No  Caffeine:No coffee. Drinks Dr. Malachi Bonds and tea daily Diet:Drinks about 40 oz water daily. Poor appetite due to nausea and vomiting. Exercise:No Depression:no; Anxiety:no Other pain:Diffuse pain Sleep hygiene:Poor due to headaches. Tired in the morning but more energy by the afternoon but fatigued again in evening.  Melatonin not working.  Medicaid said they would not cover her sleep study. HISTORY: Onset:She has prior history of episodic migraines but they became frequent and intractable inJune 2019. No known preceding event. Started taking pain relievers and headaches gradually got worse. Location:Right sided (frontal/periorbital and temple) Quality:Throbbing Initial intensity:SevereIt may wake her up at night. Shedenies new headache, thunderclap headache  Aura:No Prodrome:No Postdrome:fatigue Associated symptoms: Nausea, vomiting,photophobia, phonophobia, blurred vision (particularly in right eye, eye lacrimation, right frontal swelling.The pain causes her to feel confused and irritable.Shedenies associated unilateral numbness or weakness. Initial duration:Constant back round headache. Flare-ups last several hours (over 4 hours) InitialFrequency:Daily InitialFrequency of abortive medication:Takes Fioricet 4-5 days a week. Triggers:  Unknown Exacerbating factors:Closing her eyes, light, sound Relieving factors: Rest Activity:aggravates  MRI of brain without contrast from 06/17/18 personally reviewed and was normal.  Past NSAIDS:Naproxen, ibuprofen Past analgesics:Excedrin, Tylenol, Fioricet Past abortive triptans:Maxalt, sumatriptan 100mg  Past abortive ergotamine:None Past muscle relaxants:None Past anti-emetic:None Past antihypertensive medications:None Past antidepressant medications:Nortriptyline (irritability); venlafaxine (fatigue, felt sick) Past anticonvulsant  medications:None Past anti-CGRP:None Past vitamins/Herbal/Supplements:None Past antihistamines/decongestants:None Other past therapies:Migraine cocktails (decreased intensity but not abort)  Family history of headache:On mother's side.  Past Medical History: Past Medical History:  Diagnosis Date  . Anxiety   . Arthritis   . Cervical abnormality   . Cervical cancer ([place]) 2003  . Depression   . Diabetes mellitus without complication ([place])   . Heartburn   . Hypertension   . Interstitial cystitis   . Leukemia ([place])    Remission for 23 yrs  . Seizures ([place])   . Sleep apnea     Medications: Outpatient Encounter Medications as of 07/27/2019  Medication Sig Note  . AJOVY 225 MG/1.5ML SOSY INJECT 225 MG SUBCUTANEOUSLY EVERY 28 DAYS   . cyclobenzaprine (FLEXERIL) 10 MG tablet Take 10 mg by mouth 3 (three) times daily as needed for muscle spasms.   . diphenhydrAMINE (BENADRYL) 25 mg capsule Take 25 mg by mouth. 03/10/2016: Received from: [facility]: Take 25 mg by mouth every six (6) hours as needed for itching.  . lamoTRIgine (LAMICTAL) 25 MG tablet Wk1 25mg PMwk2 25mg 2xdaywk3 25AM50PMwk4 502xdaywk5 50AM75PMwk6 752xdaywk7 75AM100PMwk8 1002xdaywk9 100AM125PMwk10 1252xday 03/10/2016: Received from: [facility]  . ondansetron (ZOFRAN) 4 MG tablet Take 8 mg by mouth daily as needed for  nausea or vomiting.   . ondansetron (ZOFRAN-ODT) 4 MG disintegrating tablet TAKE 1 TABLET BY MOUTH EVERY 8 HOURS AS NEEDED FOR NAUSEA OR VOMITING   . SUMAtriptan (IMITREX) 6 MG/0.5ML SOLN injection Inject 0.5 mLs (6 mg total) into the skin every 2 (two) hours as needed for migraine or headache. May repeat in 2 hours if headache persists or recurs.   . SUMAtriptan 6 MG/0.5ML SOAJ INJECT 0.5 ML INTO THE SKIN EVERY 2 HOURS AS NEEDED FOR MIGRAINE OR HEADACHEMAY REPEAT IN 2 HOURS IF HEADACHE PERSISTS OR RECURS   . topiramate (TOPAMAX) 100 MG tablet TAKE 1 TABLET BY MOUTH TWICE A DAY    No facility-administered encounter medications on file as of 07/27/2019.    Allergies: Allergies  Allergen Reactions  . Tetracycline Hcl Hives    Childhood allergy  . Erythromycin Hives    Childhood allergy  . Pertussis Vaccines Hives    Childhood allergy  . Reglan [Metoclopramide] Other (See Comments)    Muscle Spasms  . Tetracyclines & Related Hives    Childhood allergy  . Latex Itching and Rash    Family History: Family History  Problem Relation Age of Onset  . Depression Mother   . Bipolar disorder Mother   . COPD Mother   . Hypertension Mother   . Mental illness Mother   . Kidney disease Father        partial kidney fx  . COPD Father   . Pancreatic cancer Paternal Grandmother   . Celiac disease Paternal Grandmother   . Hypertension Paternal Grandmother   . Heart failure Paternal Grandfather   . Prostate cancer Other   . Healthy Son   . Bladder Cancer Neg Hx     Social History: Social History   Socioeconomic History  . Marital status: Divorced    Spouse name: Not on file  . Number of children: 1  . Years of education: Not on file  . Highest education level: Some college, no degree  Occupational History  . Occupation: unemployed  Tobacco Use  . Smoking status: Current Some Day Smoker    Packs/day: 1.00    Years: 10.00    Pack years: 10.00  Types: Cigarettes  . Smokeless  tobacco: Never Used  Substance and Sexual Activity  . Alcohol use: Yes    Comment: 1 to 2 drinks monthly  . Drug use: Yes    Types: Marijuana  . Sexual activity: Yes    Birth control/protection: Surgical  Other Topics Concern  . Not on file  Social History Narrative   Patient is right-handed. She lives with her boyfriend in a one level home. She drinks three glasses of tea and two 20 oz Dr. Lyndee Hensen a day. She does not exercise.   Social Determinants of Health   Financial Resource Strain:   . Difficulty of Paying Living Expenses: Not on file  Food Insecurity:   . Worried About [place] fundraiser in the Last Year: Not on file  . Ran Out of Food in the Last Year: Not on file  Transportation Needs:   . Lack of Transportation (Medical): Not on file  . Lack of Transportation (Non-Medical): Not on file  Physical Activity:   . Days of Exercise per Week: Not on file  . Minutes of Exercise per Session: Not on file  Stress:   . Feeling of Stress : Not on file  Social Connections:   . Frequency of Communication with Friends and Family: Not on file  . Frequency of Social Gatherings with Friends and Family: Not on file  . Attends Religious Services: Not on file  . Active Member of Clubs or Organizations: Not on file  . Attends Archivist Meetings: Not on file  . Marital Status: Not on file  Intimate Partner Violence:   . Fear of Current or Ex-Partner: Not on file  . Emotionally Abused: Not on file  . Physically Abused: Not on file  . Sexually Abused: Not on file    Observations/Objective:   Height 5\' 4"  (1.626 m), weight 173 lb (78.5 kg).  Assessment and Plan:   Chronic migraine without aura, without status migrainosus, not intractable.  Ajovy was effective but she was not able to get it from [facility] for a couple of months.    1.  For preventative management, Ajovy.  Continue topiramate for 100mg  twice daily.  2.  For abortive therapy, sumatriptan 6mg  [place]; Zofran-ODT  4mg  for nausea 3.  Limit use of pain relievers to no more than 2 days out of week to prevent risk of rebound or medication-overuse headache. 4.  Keep headache diary 5.  Exercise, hydration, caffeine cessation, sleep hygiene, monitor for and avoid triggers 6. Follow up 4 months   Follow Up Instructions:    -I discussed the assessment and treatment plan with the patient. The patient was provided an opportunity to ask questions and all were answered. The patient agreed with the plan and demonstrated an understanding of the instructions.   The patient was advised to call back or seek an in-person evaluation if the symptoms worsen or if the condition fails to improve as anticipated.    Total Time spent in visit with the patient was:  17 minutes  Dudley Major, DO

## 2019-07-27 ENCOUNTER — Encounter: Payer: Self-pay | Admitting: Neurology

## 2019-07-27 ENCOUNTER — Other Ambulatory Visit: Payer: Self-pay

## 2019-07-27 ENCOUNTER — Telehealth (INDEPENDENT_AMBULATORY_CARE_PROVIDER_SITE_OTHER): Payer: Medicaid Other | Admitting: Neurology

## 2019-07-27 VITALS — Ht 64.0 in | Wt 173.0 lb

## 2019-07-27 DIAGNOSIS — G43709 Chronic migraine without aura, not intractable, without status migrainosus: Secondary | ICD-10-CM | POA: Diagnosis not present

## 2019-09-28 ENCOUNTER — Telehealth: Payer: Self-pay

## 2019-09-28 ENCOUNTER — Other Ambulatory Visit: Payer: Self-pay | Admitting: Neurology

## 2019-09-28 NOTE — Telephone Encounter (Signed)
Pt advised.

## 2019-09-28 NOTE — Telephone Encounter (Signed)
Pt called, Her insurance want cover her medication until 5/13 or 5/14 due to her last pick up last month. Pt states she is due to take the ajovy 09/30/19.   Pt wanted to give /dr. Tomi Likens an [place] and to ask if she could get a sample if her insurance is late letting her pick up?

## 2019-09-28 NOTE — Telephone Encounter (Signed)
OK to provide a sample

## 2019-10-02 ENCOUNTER — Telehealth: Payer: Self-pay | Admitting: Neurology

## 2019-10-02 ENCOUNTER — Other Ambulatory Visit: Payer: Self-pay

## 2019-10-02 MED ORDER — TOPIRAMATE 100 MG PO TABS: 100.0000 mg | ORAL_TABLET | Freq: Two times a day (BID) | ORAL | 0 refills | Status: DC

## 2019-10-02 NOTE — Telephone Encounter (Signed)
OK to refill topiramate Looks like Zofran was refilled last week. I do not prescribe her lamotrigine. I defer cyclobenzaprine to her prescribing physician at this time.

## 2019-10-02 NOTE — Telephone Encounter (Signed)
Patient needs some medications refilled. Zofran 4 mg, 10 day supply sent instead of 30 day supply Topamax 100mg , due to be refilled in 2 weeks or less Lamictal 25mg , only has 1 pill left  Patient wanted to ask if Dr. Tomi Likens would take over prescribing her Flexeril 10mg ? She is having trouble for the past month getting her other provider to fill the order, she has been out for the past month.  Patient uses [facility] [phone]

## 2019-10-03 NOTE — Telephone Encounter (Signed)
Pt informed of note below

## 2019-11-01 ENCOUNTER — Telehealth: Payer: Self-pay | Admitting: Neurology

## 2019-11-01 ENCOUNTER — Other Ambulatory Visit: Payer: Self-pay | Admitting: Neurology

## 2019-11-01 NOTE — Telephone Encounter (Signed)
I will not be filling the lamotrigine

## 2019-11-01 NOTE — Telephone Encounter (Signed)
Lamictal ?MG -- Patient called to request a new prescription for this medication. She said Dr. Tomi Likens and she discussed previously that he will start filling this for her.  [facility] in [place]

## 2019-11-01 NOTE — Telephone Encounter (Signed)
LMOVM for pt 

## 2019-11-27 NOTE — Progress Notes (Deleted)
NEUROLOGY FOLLOW UP OFFICE NOTE  Bethany Little [id number]  HISTORY OF PRESENT ILLNESS: Bethany Little is a 34 year old right-handed womanwithhypertension, chronic pain, depression,tobacco use disorderand history ofpseudoseizure, cervical cancer and leukemiawhofollows up for migraines.  UPDATE: Intensity:  *** Duration:  *** Frequency:  *** Current NSAIDS:none Current analgesics:none Current triptans:sumatriptan 6mg  [place] Current ergotamine:None Current anti-emetic:Zofran 4 mg Current muscle relaxants:Flexeril 10mg  Current anti-anxiolytic:None Current sleep aide:Trazodone Current Antihypertensive medications:none Current Antidepressant medications:None. She avoids antidepressants due to side effects to multiple antidepressants. Current Anticonvulsant medications:topiramate 100mg  twice daily, lamotrigine 150mg  twice daily (for mood) Current anti-CGRP:Ajovy Current Vitamins/Herbal/Supplements:B12 Current Antihistamines/Decongestants:Benadryl Other therapy:None Hormone/birth control:No  Caffeine:No coffee. Drinks Dr. Malachi Little and tea daily Diet:Drinks about 40 oz water daily. Poor appetite due to nausea and vomiting. Exercise:No Depression:no; Anxiety:no Other pain:Diffuse pain Sleep hygiene:Poor due to headaches. Tired in the morning but more energy by the afternoon but fatigued again in evening.Melatonin not working.  Medicaid said they would not cover her sleep study. HISTORY: Onset:She has prior history of episodic migraines but they became frequent and intractable inJune 2019. No known preceding event. Started taking pain relievers and headaches gradually got worse. Location:Right sided (frontal/periorbital and temple) Quality:Throbbing Initial intensity:SevereIt may wake her up at night. Shedenies new headache, thunderclap headache  Aura:No Prodrome:No Postdrome:fatigue Associated symptoms:  Nausea, vomiting,photophobia, phonophobia, blurred vision (particularly in right eye, eye lacrimation, right frontal swelling.The pain causes her to feel confused and irritable.Shedenies associated unilateral numbness or weakness. Initial duration:Constant back round headache. Flare-ups last several hours (over 4 hours) InitialFrequency:Daily InitialFrequency of abortive medication:Takes Fioricet 4-5 days a week. Triggers: Unknown Exacerbating factors:Closing her eyes, light, sound Relieving factors: Rest Activity:aggravates  MRI of brain without contrast from 06/17/18 personally reviewed and was normal.  Past NSAIDS:Naproxen, ibuprofen Past analgesics:Excedrin, Tylenol, Fioricet Past abortive triptans:Maxalt, sumatriptan 100mg  Past abortive ergotamine:None Past muscle relaxants:None Past anti-emetic:None Past antihypertensive medications:None Past antidepressant medications:Nortriptyline (irritability); venlafaxine (fatigue, felt sick) Past anticonvulsant medications:None Past anti-CGRP:None Past vitamins/Herbal/Supplements:None Past antihistamines/decongestants:None Other past therapies:Migraine cocktails (decreased intensity but not abort)  Family history of headache:On mother's side.  PAST MEDICAL HISTORY: Past Medical History:  Diagnosis Date  . Anxiety   . Arthritis   . Cervical abnormality   . Cervical cancer ([place]) 2003  . Depression   . Diabetes mellitus without complication ([place])   . Heartburn   . Hypertension   . Interstitial cystitis   . Leukemia ([place])    Remission for 23 yrs  . Seizures ([place])   . Sleep apnea     MEDICATIONS: Current Outpatient Medications on File Prior to Visit  Medication Sig Dispense Refill  . AJOVY 225 MG/1.5ML SOSY INJECT 225 MG SUBCUTANEOUSLY EVERY 30 DAYS 1.5 mL 3  . cyclobenzaprine (FLEXERIL) 10 MG tablet Take 10 mg by mouth 3 (three) times daily as needed for muscle spasms.    .  diphenhydrAMINE (BENADRYL) 25 mg capsule Take 25 mg by mouth.    . lamoTRIgine (LAMICTAL) 25 MG tablet Wk1 25mg PMwk2 25mg 2xdaywk3 25AM50PMwk4 502xdaywk5 50AM75PMwk6 752xdaywk7 75AM100PMwk8 1002xdaywk9 100AM125PMwk10 1252xday    . ondansetron (ZOFRAN-ODT) 4 MG disintegrating tablet TAKE 1 TABLET BY MOUTH EVERY 8 HOURS AS NEEDED FOR NAUSEA OR VOMITING 30 tablet 5  . SUMAtriptan (IMITREX) 6 MG/0.5ML SOLN injection Inject 0.5 mLs (6 mg total) into the skin every 2 (two) hours as needed for migraine or headache. May repeat in 2 hours if headache persists or recurs. (Patient not taking: Reported on 07/27/2019) 9 vial 3  . SUMAtriptan 6 MG/0.5ML SOAJ  INJECT 0.5 ML INTO THE SKIN EVERY 2 HOURS AS NEEDED FOR MIGRAINE OR HEADACHEMAY REPEAT IN 2 HOURS IF HEADACHE PERSISTS OR RECURS (Patient not taking: Reported on 07/27/2019) 9 mL 1  . topiramate (TOPAMAX) 100 MG tablet Take 1 tablet (100 mg total) by mouth 2 (two) times daily. 180 tablet 0   No current facility-administered medications on file prior to visit.    ALLERGIES: Allergies  Allergen Reactions  . Tetracycline Hcl Hives    Childhood allergy  . Erythromycin Hives    Childhood allergy  . Pertussis Vaccines Hives    Childhood allergy  . Reglan [Metoclopramide] Other (See Comments)    Muscle Spasms  . Tetracyclines & Related Hives    Childhood allergy  . Latex Itching and Rash    FAMILY HISTORY: Family History  Problem Relation Age of Onset  . Depression Mother   . Bipolar disorder Mother   . COPD Mother   . Hypertension Mother   . Mental illness Mother   . Kidney disease Father        partial kidney fx  . COPD Father   . Pancreatic cancer Paternal Grandmother   . Celiac disease Paternal Grandmother   . Hypertension Paternal Grandmother   . Heart failure Paternal Grandfather   . Prostate cancer Other   . Healthy Son   . Bladder Cancer Neg Hx    SOCIAL HISTORY: Social History   Socioeconomic History  . Marital status: Divorced     Spouse name: Not on file  . Number of children: 1  . Years of education: Not on file  . Highest education level: Some college, no degree  Occupational History  . Occupation: unemployed  Tobacco Use  . Smoking status: Current Some Day Smoker    Packs/day: 1.00    Years: 10.00    Pack years: 10.00    Types: Cigarettes  . Smokeless tobacco: Never Used  Vaping Use  . Vaping Use: Never used  Substance and Sexual Activity  . Alcohol use: Not Currently    Comment: 1 to 2 drinks monthly  . Drug use: Yes    Types: Marijuana  . Sexual activity: Yes    Birth control/protection: Surgical  Other Topics Concern  . Not on file  Social History Narrative   Patient is right-handed. She lives with her boyfriend in a one level home. She drinks three glasses of tea and two 20 oz Dr. Lyndee Hensen a day. She does not exercise.   Social Determinants of Health   Financial Resource Strain:   . Difficulty of Paying Living Expenses:   Food Insecurity:   . Worried About [place] fundraiser in the Last Year:   . Arboriculturist in the Last Year:   Transportation Needs:   . Film/video editor (Medical):   Marland Kitchen Lack of Transportation (Non-Medical):   Physical Activity:   . Days of Exercise per Week:   . Minutes of Exercise per Session:   Stress:   . Feeling of Stress :   Social Connections:   . Frequency of Communication with Friends and Family:   . Frequency of Social Gatherings with Friends and Family:   . Attends Religious Services:   . Active Member of Clubs or Organizations:   . Attends Archivist Meetings:   Marland Kitchen Marital Status:   Intimate Partner Violence:   . Fear of Current or Ex-Partner:   . Emotionally Abused:   Marland Kitchen Physically Abused:   . Sexually  Abused:     PHYSICAL EXAM: *** General: No acute distress.  Patient appears well-groomed.   Head:  Normocephalic/atraumatic Eyes:  Fundi examined but not visualized Neck: supple, no paraspinal tenderness, full range of  motion Heart:  Regular rate and rhythm Lungs:  Clear to auscultation bilaterally Back: No paraspinal tenderness Neurological Exam: alert and oriented to person, place, and time. Attention span and concentration intact, recent and remote memory intact, fund of knowledge intact.  Speech fluent and not dysarthric, language intact.  CN II-XII intact. Bulk and tone normal, muscle strength 5/5 throughout.  Sensation to light touch, temperature and vibration intact.  Deep tendon reflexes 2+ throughout, toes downgoing.  Finger to nose and heel to shin testing intact.  Gait normal, Romberg negative.  IMPRESSION: ***  PLAN: ***  Metta Clines, DO  CC: ***

## 2019-11-29 ENCOUNTER — Ambulatory Visit: Payer: Medicaid Other | Admitting: Neurology

## 2020-01-03 NOTE — Progress Notes (Signed)
NEUROLOGY FOLLOW UP OFFICE NOTE  Bethany Little [id number]  HISTORY OF PRESENT ILLNESS: Bethany Little is a 34 year old right-handed womanwithhypertension, chronic pain, depression,tobacco use disorderand history ofpseudoseizure, cervical cancer and leukemiawhofollows up for migraines.  She is accompanied by her spouse who supplements history.  UPDATE: She reports difficulty pronouncing words and finishing sentences.  She also reports difficulty with short term memory.  For example, she forgot her wallet. Intensity:  Moderate (doesn't need to stay in bed often) Duration:  Relief within 10 minutes with sumatriptan injection.  However, it is painful so she tries to avoid it.   Frequency:  On average 2 to 3 days a week.  1 to 2 days a week she has no headache.  Ajovy tends to wear off in last 2 weeks. She sometimes has swelling behind the left jaw and when she presses it, her right eye closes.   Current NSAIDS:none Current analgesics:none Current triptans:sumatriptan 6mg  Xenia Current ergotamine:None Current anti-emetic:Zofran 4 mg Current muscle relaxants:Flexeril 10mg  Current anti-anxiolytic:None Current sleep aide:none Current Antihypertensive medications:none Current Antidepressant medications:None. She avoids antidepressants due to side effects to multiple antidepressants. Current Anticonvulsant medications:topiramate 100mg  twice daily, lamotrigine 150mg  twice daily (for mood) Current anti-CGRP:Ajovy Current Vitamins/Herbal/Supplements:B12 Current Antihistamines/Decongestants:Benadryl Other therapy:None Hormone/birth control:No  Caffeine:No coffee.  Diet:Drinks about 40 oz water daily. Poor appetite due to nausea and vomiting. Exercise:No Depression:no; Anxiety:yes.  Irritable due to the headache.   Other pain:Diffuse pain Sleep hygiene:Poor due to headaches. Tired in the morning but more energy by the afternoon but  fatigued again in evening.Melatonin not working.  Medicaid said they would not cover her sleep study.  HISTORY: Onset:She has prior history of episodic migraines but they became frequent and intractable inJune 2019. No known preceding event. Started taking pain relievers and headaches gradually got worse. Location:Right sided (frontal/periorbital and temple) Quality:Throbbing Initial intensity:SevereIt may wake her up at night. Shedenies new headache, thunderclap headache  Aura:No Prodrome:No Postdrome:fatigue Associated symptoms: Nausea, vomiting,photophobia, phonophobia, blurred vision (particularly in right eye, eye lacrimation, right frontal swelling.The pain causes her to feel confused and irritable.Shedenies associated unilateral numbness or weakness. Initial duration:Constant back round headache. Flare-ups last several hours (over 4 hours) InitialFrequency:Daily InitialFrequency of abortive medication:Takes Fioricet 4-5 days a week. Triggers: Unknown Exacerbating factors:Closing her eyes, light, sound Relieving factors: Rest Activity:aggravates  MRI of brain without contrast from 06/17/18 personally reviewed and was normal.  Past NSAIDS:Naproxen, ibuprofen Past analgesics:Excedrin, Tylenol, Fioricet Past abortive triptans:Maxalt, sumatriptan 100mg  Past abortive ergotamine:None Past muscle relaxants:None Past anti-emetic:None Past antihypertensive medications:None Past antidepressant medications:Nortriptyline (irritability); venlafaxine (fatigue, felt sick) Past anticonvulsant medications:None Past anti-CGRP:None Past vitamins/Herbal/Supplements:None Past antihistamines/decongestants:None Other past therapies:Migraine cocktails (decreased intensity but not abort)  Family history of headache:On mother's side.  PAST MEDICAL HISTORY: Past Medical History:  Diagnosis Date  . Anxiety   . Arthritis   .  Cervical abnormality   . Cervical cancer ([place]) 2003  . Depression   . Diabetes mellitus without complication ([place])   . Heartburn   . Hypertension   . Interstitial cystitis   . Leukemia ([place])    Remission for 23 yrs  . Seizures ([place])   . Sleep apnea     MEDICATIONS: Current Outpatient Medications on File Prior to Visit  Medication Sig Dispense Refill  . AJOVY 225 MG/1.5ML SOSY INJECT 225 MG SUBCUTANEOUSLY EVERY 30 DAYS 1.5 mL 3  . cyclobenzaprine (FLEXERIL) 10 MG tablet Take 10 mg by mouth 3 (three) times daily as needed for muscle spasms.    Marland Kitchen  diphenhydrAMINE (BENADRYL) 25 mg capsule Take 25 mg by mouth.    . lamoTRIgine (LAMICTAL) 25 MG tablet Wk1 25mg PMwk2 25mg 2xdaywk3 25AM50PMwk4 502xdaywk5 50AM75PMwk6 752xdaywk7 75AM100PMwk8 1002xdaywk9 100AM125PMwk10 1252xday    . ondansetron (ZOFRAN-ODT) 4 MG disintegrating tablet TAKE 1 TABLET BY MOUTH EVERY 8 HOURS AS NEEDED FOR NAUSEA OR VOMITING 30 tablet 5  . SUMAtriptan (IMITREX) 6 MG/0.5ML SOLN injection Inject 0.5 mLs (6 mg total) into the skin every 2 (two) hours as needed for migraine or headache. May repeat in 2 hours if headache persists or recurs. (Patient not taking: Reported on 07/27/2019) 9 vial 3  . SUMAtriptan 6 MG/0.5ML SOAJ INJECT 0.5 ML INTO THE SKIN EVERY 2 HOURS AS NEEDED FOR MIGRAINE OR HEADACHEMAY REPEAT IN 2 HOURS IF HEADACHE PERSISTS OR RECURS (Patient not taking: Reported on 07/27/2019) 9 mL 1  . topiramate (TOPAMAX) 100 MG tablet Take 1 tablet (100 mg total) by mouth 2 (two) times daily. 180 tablet 0   No current facility-administered medications on file prior to visit.    ALLERGIES: Allergies  Allergen Reactions  . Tetracycline Hcl Hives    Childhood allergy  . Erythromycin Hives    Childhood allergy  . Pertussis Vaccines Hives    Childhood allergy  . Reglan [Metoclopramide] Other (See Comments)    Muscle Spasms  . Tetracyclines & Related Hives    Childhood allergy  . Latex Itching and Rash    FAMILY  HISTORY: Family History  Problem Relation Age of Onset  . Depression Mother   . Bipolar disorder Mother   . COPD Mother   . Hypertension Mother   . Mental illness Mother   . Kidney disease Father        partial kidney fx  . COPD Father   . Pancreatic cancer Paternal Grandmother   . Celiac disease Paternal Grandmother   . Hypertension Paternal Grandmother   . Heart failure Paternal Grandfather   . Prostate cancer Other   . Healthy Son   . Bladder Cancer Neg Hx     SOCIAL HISTORY: Social History   Socioeconomic History  . Marital status: Divorced    Spouse name: Not on file  . Number of children: 1  . Years of education: Not on file  . Highest education level: Some college, no degree  Occupational History  . Occupation: unemployed  Tobacco Use  . Smoking status: Current Some Day Smoker    Packs/day: 1.00    Years: 10.00    Pack years: 10.00    Types: Cigarettes  . Smokeless tobacco: Never Used  Vaping Use  . Vaping Use: Never used  Substance and Sexual Activity  . Alcohol use: Not Currently    Comment: 1 to 2 drinks monthly  . Drug use: Yes    Types: Marijuana  . Sexual activity: Yes    Birth control/protection: Surgical  Other Topics Concern  . Not on file  Social History Narrative   Patient is right-handed. She lives with her boyfriend in a one level home. She drinks three glasses of tea and two 20 oz Dr. Lyndee Hensen a day. She does not exercise.   Social Determinants of Health   Financial Resource Strain:   . Difficulty of Paying Living Expenses:   Food Insecurity:   . Worried About [place] fundraiser in the Last Year:   . Arboriculturist in the Last Year:   Transportation Needs:   . Film/video editor (Medical):   Marland Kitchen Lack of Transportation (Non-Medical):  Physical Activity:   . Days of Exercise per Week:   . Minutes of Exercise per Session:   Stress:   . Feeling of Stress :   Social Connections:   . Frequency of Communication with Friends and  Family:   . Frequency of Social Gatherings with Friends and Family:   . Attends Religious Services:   . Active Member of Clubs or Organizations:   . Attends Archivist Meetings:   Marland Kitchen Marital Status:   Intimate Partner Violence:   . Fear of Current or Ex-Partner:   . Emotionally Abused:   Marland Kitchen Physically Abused:   . Sexually Abused:     PHYSICAL EXAM: Blood pressure 124/84, pulse 99, height 5\' 4"  (1.626 m), weight 162 lb (73.5 kg), SpO2 100 %. General: No acute distress.  Patient appears well-groomed.   Head:  Normocephalic/atraumatic Eyes:  Fundi examined but not visualized Neck: supple, no paraspinal tenderness, full range of motion Heart:  Regular rate and rhythm Lungs:  Clear to auscultation bilaterally Back: No paraspinal tenderness Neurological Exam: alert and oriented to person, place, and time. Attention span and concentration intact, recent and remote memory intact, fund of knowledge intact.  Speech fluent and not dysarthric, language intact.  CN II-XII intact. Bulk and tone normal, muscle strength 5/5 throughout.  Sensation to light touch, temperature and vibration intact.  Deep tendon reflexes 2+ throughout, toes downgoing.  Finger to nose and heel to shin testing intact.  Gait normal, Romberg negative.  IMPRESSION: 1.  Migraine without aura, without status migrainosus, not intractable 2.  Psychogenic nonepileptic seizures.  Treated by her previous neurologist with lamotrigine, which has significantly helped her mood and she has not had any spells.  She does not wish to follow up with her prior neurologist and psychiatry as declined to treat.  She would like to remain on the lamotrigine.  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 Delice Lesch, has said she would manage her lamotrigine.  PLAN: 1.  For preventative management, change from Ajovy to [facility] 2.  Due to cognitive deficits, will taper off of topiramate. 3.  For abortive therapy, she will  stop sumatriptan [place] and instead try Ubrelvy 100mg . 4.  For nausea, Zofran 5.  Refer to Dr. Delice Lesch regarding management of lamotrigine for PNES.  Will provide refills until her appointment. 6.  Limit use of pain relievers to no more than 2 days out of week to prevent risk of rebound or medication-overuse headache. 7.  Keep headache diary 8.  Exercise, hydration, caffeine cessation, sleep hygiene, monitor for and avoid triggers 9.  Consider:  magnesium citrate 400mg  daily, riboflavin 400mg  daily, and coenzyme Q10 100mg  three times daily 10. Always keep in mind that currently taking a hormone or birth control may be a possible trigger or aggravating factor for migraine. 11. Follow up 6 months.   Metta Clines, DO  CC:  Steele Sizer, MD

## 2020-01-07 ENCOUNTER — Other Ambulatory Visit: Payer: Self-pay

## 2020-01-07 ENCOUNTER — Ambulatory Visit (INDEPENDENT_AMBULATORY_CARE_PROVIDER_SITE_OTHER): Payer: Medicaid Other | Admitting: Neurology

## 2020-01-07 ENCOUNTER — Encounter: Payer: Self-pay | Admitting: Neurology

## 2020-01-07 VITALS — BP 124/84 | HR 99 | Ht 64.0 in | Wt 162.0 lb

## 2020-01-07 DIAGNOSIS — G43709 Chronic migraine without aura, not intractable, without status migrainosus: Secondary | ICD-10-CM | POA: Diagnosis not present

## 2020-01-07 DIAGNOSIS — F445 Conversion disorder with seizures or convulsions: Secondary | ICD-10-CM | POA: Diagnosis not present

## 2020-01-07 NOTE — Progress Notes (Signed)
Medication Samples have been provided to the patient.  Drug name: Emgality       Strength:     Qty: 1  LOT: [id number]  Exp.Date: 1/23 Dosing instructions: The patient has been instructed regarding the correct time, dose, and frequency of taking this medication, including desired effects and most common side effects.   Venetia Night 3:17 PM 01/07/2020     Medication Samples have been provided to the patient.  Drug name:Ubrelvy        Strength: 100 mg       Qty: 4  DYN:[id number]  Exp.Date: 12/22  Dosing instructions:  The patient has been instructed regarding the correct time, dose, and frequency of taking this medication, including desired effects and most common side effects.   Venetia Night 3:18 PM 01/07/2020

## 2020-01-07 NOTE — Patient Instructions (Signed)
1.  Stop Ajovy.  Instead, we will start Emgality.  First dose requires to injections.  Then 1 injection every 30 days. 2.  We will taper off of topiramate:  Take 1/2 tablet twice daily for one week             Then 1/2 tablet at bedtime for one week             Then STOP 3.  Instead of sumatriptan injection, take Ubrelvy 100mg  tablet.  May repeat in 2 hours if needed.  Maximum 2 tablets in 24 hours. 4.  Follow up with Dr. Delice Lesch for pseudoseizures 5.  Follow up in 6 months.

## 2020-01-09 ENCOUNTER — Telehealth: Payer: Self-pay | Admitting: Neurology

## 2020-01-09 ENCOUNTER — Other Ambulatory Visit: Payer: Self-pay | Admitting: Neurology

## 2020-01-09 NOTE — Telephone Encounter (Signed)
Patient states she was told that Dr Tomi Likens would send in 2 prescriptions for her: Lamictal 200 mg in the morning and 200 in the evening Ubrelvy 100mg   [facility] send to [facility].

## 2020-01-09 NOTE — Telephone Encounter (Signed)
Spoke to Pharmacy pt takes Lamictal 200 mg BID. Pt has an appt with Dr. Cecille Rubin on 01/25/20

## 2020-01-10 ENCOUNTER — Other Ambulatory Visit: Payer: Self-pay | Admitting: Neurology

## 2020-01-10 ENCOUNTER — Encounter: Payer: Self-pay | Admitting: Neurology

## 2020-01-10 MED ORDER — UBRELVY 100 MG PO TABS: ORAL_TABLET | ORAL | 5 refills | Status: DC

## 2020-01-10 MED ORDER — LAMOTRIGINE 200 MG PO TABS: 200.0000 mg | ORAL_TABLET | Freq: Two times a day (BID) | ORAL | 1 refills | Status: DC

## 2020-01-10 NOTE — Progress Notes (Addendum)
Kam Zenor ([facility] Shipper) Rx #: [id number] [name] 100MG  tablets   Form [employer] Medicaid Electronic Prior Authorization Form ([employer]) (CB) 2017 NCPDP Created 4 hours ago Sent to Plan 1 hour ago Plan Response 1 hour ago Submit Clinical Questions 1 hour ago Determination Favorable 3 minutes ago Message from Plan Approved for UBRELVY (Ubrogepant Tablet 100 MG), quantity up to 10 tablets per 5 days, under the pharmacy benefit. Generic substitution required when available.

## 2020-01-10 NOTE — Telephone Encounter (Signed)
Prescription(s) have been sent

## 2020-01-25 ENCOUNTER — Ambulatory Visit (INDEPENDENT_AMBULATORY_CARE_PROVIDER_SITE_OTHER): Payer: Medicaid Other | Admitting: Neurology

## 2020-01-25 ENCOUNTER — Other Ambulatory Visit (INDEPENDENT_AMBULATORY_CARE_PROVIDER_SITE_OTHER): Payer: Medicaid Other

## 2020-01-25 ENCOUNTER — Encounter: Payer: Self-pay | Admitting: Neurology

## 2020-01-25 ENCOUNTER — Other Ambulatory Visit: Payer: Self-pay

## 2020-01-25 VITALS — BP 124/85 | HR 93 | Ht 64.0 in | Wt 158.0 lb

## 2020-01-25 DIAGNOSIS — R413 Other amnesia: Secondary | ICD-10-CM

## 2020-01-25 DIAGNOSIS — R404 Transient alteration of awareness: Secondary | ICD-10-CM

## 2020-01-25 DIAGNOSIS — F445 Conversion disorder with seizures or convulsions: Secondary | ICD-10-CM

## 2020-01-25 LAB — TSH: TSH: 1 u[IU]/mL (ref 0.35–4.50)

## 2020-01-25 LAB — VITAMIN B12: Vitamin B-12: 507 pg/mL (ref 211–911)

## 2020-01-25 MED ORDER — LAMOTRIGINE 200 MG PO TABS
200.0000 mg | ORAL_TABLET | Freq: Two times a day (BID) | ORAL | 11 refills | Status: DC
Start: 2020-01-25 — End: 2020-05-27
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
## 2020-01-25 NOTE — Patient Instructions (Signed)
1. Schedule 72-hour EEG  2. Bloodwork for TSH, B12  3. Continue Lamictal 200mg  twice a day  4. Recommend starting Cognitive Behavioral Therapy  5. A helpful website for my patients has been Www.neurosymptoms.org  6. As per  driving laws, no driving after an episode of loss of awareness until 6 months event-free  7. Continue follow-up with Dr. Tomi Likens for migraines. Follow-up with me in 4-5 months, call for any changes

## 2020-01-27 ENCOUNTER — Inpatient Hospital Stay (HOSPITAL_COMMUNITY)
Admission: EM | Admit: 2020-01-27 | Discharge: 2020-01-28 | DRG: 343 | Disposition: A | Discharge status: Home / Self Care | Payer: Medicaid Other | Attending: General Surgery | Admitting: General Surgery

## 2020-01-27 ENCOUNTER — Emergency Department (HOSPITAL_COMMUNITY): Payer: Medicaid Other

## 2020-01-27 ENCOUNTER — Encounter (HOSPITAL_COMMUNITY): Payer: Self-pay

## 2020-01-27 ENCOUNTER — Other Ambulatory Visit: Payer: Self-pay

## 2020-01-27 DIAGNOSIS — Z79899 Other long term (current) drug therapy: Secondary | ICD-10-CM

## 2020-01-27 DIAGNOSIS — G8929 Other chronic pain: Secondary | ICD-10-CM | POA: Diagnosis present

## 2020-01-27 DIAGNOSIS — B379 Candidiasis, unspecified: Secondary | ICD-10-CM | POA: Diagnosis present

## 2020-01-27 DIAGNOSIS — F17210 Nicotine dependence, cigarettes, uncomplicated: Secondary | ICD-10-CM | POA: Diagnosis present

## 2020-01-27 DIAGNOSIS — Z881 Allergy status to other antibiotic agents status: Secondary | ICD-10-CM

## 2020-01-27 DIAGNOSIS — Z91040 Latex allergy status: Secondary | ICD-10-CM

## 2020-01-27 DIAGNOSIS — K3580 Unspecified acute appendicitis: Secondary | ICD-10-CM

## 2020-01-27 DIAGNOSIS — Z20822 Contact with and (suspected) exposure to covid-19: Secondary | ICD-10-CM | POA: Diagnosis present

## 2020-01-27 DIAGNOSIS — R102 Pelvic and perineal pain: Secondary | ICD-10-CM | POA: Diagnosis present

## 2020-01-27 DIAGNOSIS — Z841 Family history of disorders of kidney and ureter: Secondary | ICD-10-CM | POA: Diagnosis not present

## 2020-01-27 DIAGNOSIS — Z8249 Family history of ischemic heart disease and other diseases of the circulatory system: Secondary | ICD-10-CM

## 2020-01-27 DIAGNOSIS — Z856 Personal history of leukemia: Secondary | ICD-10-CM

## 2020-01-27 DIAGNOSIS — G43909 Migraine, unspecified, not intractable, without status migrainosus: Secondary | ICD-10-CM | POA: Diagnosis present

## 2020-01-27 DIAGNOSIS — I10 Essential (primary) hypertension: Secondary | ICD-10-CM | POA: Diagnosis present

## 2020-01-27 DIAGNOSIS — B9689 Other specified bacterial agents as the cause of diseases classified elsewhere: Secondary | ICD-10-CM | POA: Diagnosis not present

## 2020-01-27 DIAGNOSIS — F445 Conversion disorder with seizures or convulsions: Secondary | ICD-10-CM | POA: Diagnosis present

## 2020-01-27 DIAGNOSIS — R569 Unspecified convulsions: Secondary | ICD-10-CM | POA: Diagnosis present

## 2020-01-27 DIAGNOSIS — G4730 Sleep apnea, unspecified: Secondary | ICD-10-CM | POA: Diagnosis present

## 2020-01-27 DIAGNOSIS — K353 Acute appendicitis with localized peritonitis, without perforation or gangrene: Secondary | ICD-10-CM | POA: Diagnosis present

## 2020-01-27 DIAGNOSIS — Z888 Allergy status to other drugs, medicaments and biological substances status: Secondary | ICD-10-CM

## 2020-01-27 DIAGNOSIS — Z818 Family history of other mental and behavioral disorders: Secondary | ICD-10-CM | POA: Diagnosis not present

## 2020-01-27 DIAGNOSIS — N760 Acute vaginitis: Secondary | ICD-10-CM | POA: Diagnosis present

## 2020-01-27 DIAGNOSIS — N3010 Interstitial cystitis (chronic) without hematuria: Secondary | ICD-10-CM | POA: Diagnosis present

## 2020-01-27 DIAGNOSIS — Z887 Allergy status to serum and vaccine status: Secondary | ICD-10-CM | POA: Diagnosis not present

## 2020-01-27 DIAGNOSIS — Z825 Family history of asthma and other chronic lower respiratory diseases: Secondary | ICD-10-CM | POA: Diagnosis not present

## 2020-01-27 DIAGNOSIS — Z8541 Personal history of malignant neoplasm of cervix uteri: Secondary | ICD-10-CM | POA: Diagnosis not present

## 2020-01-27 DIAGNOSIS — M1990 Unspecified osteoarthritis, unspecified site: Secondary | ICD-10-CM | POA: Diagnosis present

## 2020-01-27 DIAGNOSIS — Z800 Family history of malignant neoplasm of digestive organs: Secondary | ICD-10-CM

## 2020-01-27 DIAGNOSIS — Z8379 Family history of other diseases of the digestive system: Secondary | ICD-10-CM

## 2020-01-27 DIAGNOSIS — R1031 Right lower quadrant pain: Secondary | ICD-10-CM | POA: Diagnosis present

## 2020-01-27 LAB — URINALYSIS, ROUTINE W REFLEX MICROSCOPIC
Bilirubin Urine: NEGATIVE
Glucose, UA: NEGATIVE mg/dL
Hgb urine dipstick: NEGATIVE
Ketones, ur: NEGATIVE mg/dL
Nitrite: NEGATIVE
Protein, ur: NEGATIVE mg/dL
Specific Gravity, Urine: 1.013 (ref 1.005–1.030)
pH: 7 (ref 5.0–8.0)

## 2020-01-27 LAB — WET PREP, GENITAL: Trich, Wet Prep: NONE SEEN

## 2020-01-27 LAB — COMPREHENSIVE METABOLIC PANEL
ALT: 23 U/L (ref 0–44)
AST: 20 U/L (ref 15–41)
Albumin: 4.5 g/dL (ref 3.5–5.0)
Alkaline Phosphatase: 57 U/L (ref 38–126)
Anion gap: 11 (ref 5–15)
BUN: 11 mg/dL (ref 6–20)
CO2: 24 mmol/L (ref 22–32)
Calcium: 9.8 mg/dL (ref 8.9–10.3)
Chloride: 105 mmol/L (ref 98–111)
Creatinine, Ser: 0.89 mg/dL (ref 0.44–1.00)
GFR calc Af Amer: >60 mL/min (ref >60)
GFR calc non Af Amer: >60 mL/min (ref >60)
Glucose, Bld: 87 mg/dL (ref 70–99)
Potassium: 4 mmol/L (ref 3.5–5.1)
Sodium: 140 mmol/L (ref 135–145)
Total Bilirubin: 0.3 mg/dL (ref 0.3–1.2)
Total Protein: 7.9 g/dL (ref 6.5–8.1)

## 2020-01-27 LAB — CBC
HCT: 47 % — ABNORMAL HIGH (ref 36.0–46.0)
Hemoglobin: 15 g/dL (ref 12.0–15.0)
MCH: 31.9 pg (ref 26.0–34.0)
MCHC: 31.9 g/dL (ref 30.0–36.0)
MCV: 100 fL (ref 80.0–100.0)
Platelets: 264 10*3/uL (ref 150–400)
RBC: 4.7 MIL/uL (ref 3.87–5.11)
RDW: 13.8 % (ref 11.5–15.5)
WBC: 11.3 10*3/uL — ABNORMAL HIGH (ref 4.0–10.5)
nRBC: 0 % (ref 0.0–0.2)

## 2020-01-27 LAB — LIPASE, BLOOD: Lipase: 32 U/L (ref 11–51)

## 2020-01-27 LAB — SARS CORONAVIRUS 2 BY RT PCR (HOSPITAL ORDER, PERFORMED IN ~~LOC~~ HOSPITAL LAB): SARS Coronavirus 2: NEGATIVE

## 2020-01-27 MED ORDER — ONDANSETRON HCL 4 MG PO TABS: 4.0000 mg | ORAL_TABLET | Freq: Four times a day (QID) | ORAL | Status: DC | PRN

## 2020-01-27 MED ORDER — METRONIDAZOLE IN NACL 5-0.79 MG/ML-% IV SOLN
500.0000 mg | Freq: Once | INTRAVENOUS | Status: AC
  Administered 2020-01-27: 500 mg via INTRAVENOUS
  Filled 2020-01-27: qty 100

## 2020-01-27 MED ORDER — LAMOTRIGINE 100 MG PO TABS
200.0000 mg | ORAL_TABLET | Freq: Two times a day (BID) | ORAL | Status: DC
  Administered 2020-01-27: 200 mg via ORAL
  Filled 2020-01-27: qty 8

## 2020-01-27 MED ORDER — SODIUM CHLORIDE 0.9 % IV BOLUS
1000.0000 mL | Freq: Once | INTRAVENOUS | Status: AC
  Administered 2020-01-27: 1000 mL via INTRAVENOUS

## 2020-01-27 MED ORDER — ONDANSETRON HCL 4 MG/2ML IJ SOLN
4.0000 mg | Freq: Four times a day (QID) | INTRAMUSCULAR | Status: DC | PRN
  Administered 2020-01-28 (×2): 4 mg via INTRAVENOUS
  Filled 2020-01-27 (×2): qty 2

## 2020-01-27 MED ORDER — SODIUM CHLORIDE 0.9 % IV SOLN
2.0000 g | Freq: Once | INTRAVENOUS | Status: AC
  Administered 2020-01-27: 2 g via INTRAVENOUS
  Filled 2020-01-27: qty 20

## 2020-01-27 MED ORDER — DEXTROSE-NACL 5-0.45 % IV SOLN: INTRAVENOUS | Status: AC

## 2020-01-27 MED ORDER — MORPHINE SULFATE (PF) 2 MG/ML IV SOLN
2.0000 mg | INTRAVENOUS | Status: DC | PRN
  Administered 2020-01-28: 2 mg via INTRAVENOUS
  Administered 2020-01-28: 4 mg via INTRAVENOUS
  Filled 2020-01-27: qty 2
  Filled 2020-01-27: qty 1

## 2020-01-27 MED ORDER — ACETAMINOPHEN 325 MG PO TABS: 650.0000 mg | ORAL_TABLET | Freq: Four times a day (QID) | ORAL | Status: DC | PRN

## 2020-01-27 MED ORDER — METRONIDAZOLE IN NACL 5-0.79 MG/ML-% IV SOLN
500.0000 mg | Freq: Three times a day (TID) | INTRAVENOUS | Status: DC
  Administered 2020-01-28: 500 mg via INTRAVENOUS
  Filled 2020-01-27: qty 100

## 2020-01-27 MED ORDER — MORPHINE SULFATE (PF) 4 MG/ML IV SOLN
4.0000 mg | Freq: Once | INTRAVENOUS | Status: AC
  Administered 2020-01-27: 4 mg via INTRAVENOUS
  Filled 2020-01-27: qty 1

## 2020-01-27 MED ORDER — IOHEXOL 300 MG/ML  SOLN
100.0000 mL | Freq: Once | INTRAMUSCULAR | Status: AC | PRN
  Administered 2020-01-27: 100 mL via INTRAVENOUS

## 2020-01-27 MED ORDER — ONDANSETRON 4 MG PO TBDP
4.0000 mg | ORAL_TABLET | Freq: Once | ORAL | Status: AC | PRN
  Administered 2020-01-27: 4 mg via ORAL
  Filled 2020-01-27: qty 1

## 2020-01-27 MED ORDER — SODIUM CHLORIDE 0.9 % IV SOLN: 2.0000 g | INTRAVENOUS | Status: DC

## 2020-01-27 MED ORDER — ACETAMINOPHEN 650 MG RE SUPP: 650.0000 mg | Freq: Four times a day (QID) | RECTAL | Status: DC | PRN

## 2020-01-27 NOTE — Progress Notes (Signed)
[facility]  Patient with findings concerning for early appendicitis. Iv antibiotics tonight. Plan for OR tomorrow AM. Covid pending, have asked if Hospitalist can admit and I'll take over first thing in AM.  Curlene Labrum, MD

## 2020-01-27 NOTE — H&P (Signed)
History and Physical    LORELLE MACALUSO ZOX:[id number] DOB: Jun 28, 1985 DOA: 01/27/2020  PCP: Steele Sizer, MD   Patient coming from: Home   Chief Complaint: RLQ pain, nausea, vomiting, chills    HPI: Bethany Little is a 34 y.o. female with medical history significant for nonepileptic seizures, chronic migraine, and interstitial cystitis, now presenting to the emergency department with 1 day of right lower quadrant pain and nausea/vomiting.  Patient reports that she was in her usual state of health until yesterday morning when she developed pain in the right lower quadrant of her abdomen.  This was associated with nausea and nonbloody vomiting.  She reports some improvement in the nausea with Zofran and marijuana at home but continued to have pain and went on to develop chills which prompted her presentation to the ED.  Pain is waxing and waning, severe at times, cramping in character, radiating towards her back, and with no alleviating or exacerbating factors identified.  ED Course: Upon arrival to the ED, patient is found to be afebrile, saturating well on room air, and with stable blood pressure.  Chemistry panel is unremarkable and CBC notable for mild leukocytosis.  Clue cells and WBCs are noted on wet prep.  CT of the abdomen and pelvis is concerning for early appendicitis.  Surgery was consulted by the ED physician and recommended medical admission.  Patient was given a liter of saline, morphine, Zofran, Rocephin, and Flagyl in the ED.  COVID-19 screening test has not yet resulted.  Review of Systems:  All other systems reviewed and apart from HPI, are negative.  Past Medical History:  Diagnosis Date  . Anxiety   . Arthritis   . Cervical abnormality   . Cervical cancer ([place]) 2003  . Depression   . Heartburn   . Hypertension   . Interstitial cystitis   . Leukemia ([place])    Remission for 23 yrs  . Seizures ([place])   . Sleep apnea     Past Surgical History:  Procedure  Laterality Date  . CARDIAC CATHETERIZATION     73 weeks old  . CESAREAN SECTION    . LEEP    . TUBAL LIGATION      Social History:   reports that she has been smoking cigarettes. She has a 10.00 pack-year smoking history. She has never used smokeless tobacco. She reports previous alcohol use. She reports current drug use. Drug: Marijuana.  Allergies  Allergen Reactions  . Tetracycline Hcl Hives    Childhood allergy  . Erythromycin Hives    Childhood allergy  . Pertussis Vaccines Hives    Childhood allergy  . Reglan [Metoclopramide] Other (See Comments)    Muscle Spasms  . Tetracyclines & Related Hives    Childhood allergy  . Latex Itching and Rash    Family History  Problem Relation Age of Onset  . Depression Mother   . Bipolar disorder Mother   . COPD Mother   . Hypertension Mother   . Mental illness Mother   . Kidney disease Father        partial kidney fx  . COPD Father   . Pancreatic cancer Paternal Grandmother   . Celiac disease Paternal Grandmother   . Hypertension Paternal Grandmother   . Heart failure Paternal Grandfather   . Prostate cancer Other   . Healthy Son   . Bladder Cancer Neg Hx      Prior to Admission medications   Medication Sig Start Date End Date Taking? Authorizing  Provider  cyclobenzaprine (FLEXERIL) 10 MG tablet Take 10 mg by mouth 3 (three) times daily as needed for muscle spasms.   Yes [provider]  diphenhydrAMINE (BENADRYL) 25 mg capsule Take 25 mg by mouth.   Yes [provider]  lamoTRIgine (LAMICTAL) 200 MG tablet Take 1 tablet (200 mg total) by mouth 2 (two) times daily. 01/25/20  Yes Cameron Sprang, MD  ondansetron (ZOFRAN-ODT) 4 MG disintegrating tablet TAKE 1 TABLET BY MOUTH EVERY 8 HOURS AS NEEDED FOR NAUSEA OR VOMITING Patient taking differently: Take 4 mg by mouth every 8 (eight) hours as needed for nausea or vomiting.  09/28/19  Yes Tomi Likens, Adam R, DO  SUMAtriptan (IMITREX) 6 MG/0.5ML SOLN injection Inject  0.5 mLs (6 mg total) into the skin every 2 (two) hours as needed for migraine or headache. May repeat in 2 hours if headache persists or recurs. 06/22/18  Yes Jaffe, Adam R, DO  topiramate (TOPAMAX) 100 MG tablet Take 1 tablet (100 mg total) by mouth 2 (two) times daily. 10/02/19  Yes Jaffe, Adam R, DO  AJOVY 225 MG/1.5ML SOSY INJECT 225 MG SUBCUTANEOUSLY EVERY 30 DAYS Patient not taking: Reported on 01/25/2020 09/28/19   Metta Clines R, DO  SUMAtriptan 6 MG/0.5ML SOAJ INJECT 0.5 ML INTO THE SKIN EVERY 2 HOURS AS NEEDED FOR MIGRAINE OR HEADACHEMAY REPEAT IN 2 HOURS IF HEADACHE PERSISTS OR RECURS 07/02/19   Tomi Likens, Adam R, DO  Ubrogepant (UBRELVY) 100 MG TABS Take 1 tablet for migraine.  May repeat in 2 hours if needed.  Maximum 2 tablets in 24 hours. 01/10/20   Pieter Partridge, DO    Physical Exam: Vitals:   01/27/20 1942 01/27/20 1944 01/27/20 2000 01/27/20 2200  BP: 118/82  112/74   Pulse:  82 84 81  Resp:   18   Temp:      TempSrc:      SpO2:  100% 100% 100%  Weight:      Height:        Constitutional: NAD, calm  Eyes: PERTLA, lids and conjunctivae normal ENMT: Mucous membranes are moist. Posterior pharynx clear of any exudate or lesions.   Neck: normal, supple, no masses, no thyromegaly Respiratory:  no wheezing, no crackles. No accessory muscle use.  Cardiovascular: S1 & S2 heard, regular rate and rhythm. No extremity edema.   Abdomen: No distension, soft, tender in RLQ without rebound pain or guarding. Bowel sounds active.  Musculoskeletal: no clubbing / cyanosis. No joint deformity upper and lower extremities.   Skin: no significant rashes, lesions, ulcers. Warm, dry, well-perfused. Neurologic: CN 2-12 grossly intact. Sensation intact. Moving all extremities.  Psychiatric: Alert and oriented to person, place, and situation. Calm and cooperative.    Labs and Imaging on Admission: I have personally reviewed following labs and imaging studies  CBC: Recent Labs  Lab 01/27/20 1946  WBC  11.3*  HGB 15.0  HCT 47.0*  MCV 100.0  PLT 762   Basic Metabolic Panel: Recent Labs  Lab 01/27/20 1946  NA 140  K 4.0  CL 105  CO2 24  GLUCOSE 87  BUN 11  CREATININE 0.89  CALCIUM 9.8   GFR: Estimated Creatinine Clearance: 84.6 mL/min (by C-G formula based on SCr of 0.89 mg/dL). Liver Function Tests: Recent Labs  Lab 01/27/20 1946  AST 20  ALT 23  ALKPHOS 57  BILITOT 0.3  PROT 7.9  ALBUMIN 4.5   Recent Labs  Lab 01/27/20 1946  LIPASE 32   No results  for input(s): AMMONIA in the last 168 hours. Coagulation Profile: No results for input(s): INR, PROTIME in the last 168 hours. Cardiac Enzymes: No results for input(s): CKTOTAL, CKMB, CKMBINDEX, TROPONINI in the last 168 hours. BNP (last 3 results) No results for input(s): PROBNP in the last 8760 hours. HbA1C: No results for input(s): HGBA1C in the last 72 hours. CBG: No results for input(s): GLUCAP in the last 168 hours. Lipid Profile: No results for input(s): CHOL, HDL, LDLCALC, TRIG, CHOLHDL, LDLDIRECT in the last 72 hours. Thyroid Function Tests: Recent Labs    01/25/20 1130  TSH 1.00   Anemia Panel: Recent Labs    01/25/20 1130  VITAMINB12 507   Urine analysis:    Component Value Date/Time   COLORURINE YELLOW 01/27/2020 1950   APPEARANCEUR CLOUDY (A) 01/27/2020 1950   APPEARANCEUR Cloudy (A) 04/13/2016 1420   LABSPEC 1.013 01/27/2020 1950   LABSPEC 1.001 07/01/2014 1258   PHURINE 7.0 01/27/2020 1950   GLUCOSEU NEGATIVE 01/27/2020 1950   GLUCOSEU Negative 07/01/2014 1258   HGBUR NEGATIVE 01/27/2020 1950   BILIRUBINUR NEGATIVE 01/27/2020 1950   BILIRUBINUR Negative 04/13/2016 1420   BILIRUBINUR Negative 07/01/2014 [place] 01/27/2020 1950   PROTEINUR NEGATIVE 01/27/2020 1950   UROBILINOGEN 0.2 04/11/2014 1749   NITRITE NEGATIVE 01/27/2020 1950   LEUKOCYTESUR LARGE (A) 01/27/2020 1950   LEUKOCYTESUR Negative 07/01/2014 1258   Sepsis  Labs: @LABRCNTIP (procalcitonin:4,lacticidven:4) ) Recent Results (from the past 240 hour(s))  Wet prep, genital     Status: Abnormal   Collection Time: 01/27/20  8:21 PM   Specimen: PATH Cytology Cervicovaginal Ancillary Only  Result Value Ref Range Status   Yeast Wet Prep HPF POC PRESENT (A) NONE SEEN Final   Trich, Wet Prep NONE SEEN NONE SEEN Final   Clue Cells Wet Prep HPF POC PRESENT (A) NONE SEEN Final   WBC, Wet Prep HPF POC MODERATE (A) NONE SEEN Final   Sperm PRESENT  Final    Comment: Performed at [facility], [address] [place], [place] [address]     Radiological Exams on Admission: CT ABDOMEN PELVIS W CONTRAST  Result Date: 01/27/2020 CLINICAL DATA:  Right lower quadrant abdominal pain EXAM: CT ABDOMEN AND PELVIS WITH CONTRAST TECHNIQUE: Multidetector CT imaging of the abdomen and pelvis was performed using the standard protocol following bolus administration of intravenous contrast. CONTRAST:  126mL OMNIPAQUE IOHEXOL 300 MG/ML  SOLN COMPARISON:  12/26/2015 FINDINGS: Lower chest: No acute abnormality. Hepatobiliary: No focal liver abnormality is seen. No gallstones, gallbladder wall thickening, or biliary dilatation. Pancreas: Unremarkable Spleen: Unremarkable Adrenals/Urinary Tract: Adrenal glands are unremarkable. Kidneys are normal, without renal calculi, focal lesion, or hydronephrosis. Bladder is unremarkable. Stomach/Bowel: The stomach, small bowel, and large bowel are unremarkable. The appendix is hyperemic and, while not overtly dilated, is thickened when compared to prior examination and is suspicious for changes of early appendicitis in the setting of right lower quadrant pain. No evidence of obstruction. Mild free fluid within the pelvis may be physiologic in a female patient of this age. No free intraperitoneal gas. Vascular/Lymphatic: No significant vascular findings are present. No enlarged abdominal or pelvic lymph nodes. Reproductive: Bilateral corpus lutea are  noted. The pelvic organs are otherwise normal. Other: Rectum unremarkable Musculoskeletal: No acute bone abnormality IMPRESSION: The appendix is hyperemic and, while not overtly dilated, is thickened when compared to prior examination and is suspicious for changes of early appendicitis in the setting of right lower quadrant pain. Appendix: Location: Inferior to cecum Diameter:  8 mm Appendicolith: No Mucosal hyper-enhancement: Yes Extraluminal gas: No Periappendiceal collection: No Electronically Signed   By: Fidela Salisbury MD   On: 01/27/2020 21:20    Assessment/Plan   1. Acute appendicitis  - Presents with one day of RLQ pain and nausea and has CT findings concerningn for early appendicitis  - She does not meet sepsis criteria on admission  - Surgery is consulting and much appreciated  - Continue Rocephin and Flagyl, pain-control    2. Bacterial vaginosis  - She is being treated with Flagyl as above    3. Chronic migraine; nonepileptic seizures  - Plan to continue home medications pending pharmacy medication-reononcilation     DVT prophylaxis: SCDs  Code Status: Full  Family Communication: Discussed with patient  Disposition Plan:  Patient is from: Home  Anticipated d/c is to: Home  Anticipated d/c date is: 01/30/20 Patient currently: Pending surgical consultation  Consults called: Surgery  Admission status:  Inpatient    Vianne Bulls, MD [facility]  01/27/2020, 10:31 PM

## 2020-01-27 NOTE — ED Triage Notes (Signed)
Pt to er, pt states that for the past 24 hrs pt has had RLQ pain, states that the pain seems to come and go, states that she also feels like she has some swelling in the area.  Pt states that she took some zofran earlier and that helped with her nausea; however, the zofran seems to be wearing off and she is nauseated.

## 2020-01-27 NOTE — ED Provider Notes (Signed)
[facility] EMERGENCY DEPARTMENT Provider Note   CSN: [id number] Arrival date & time: 01/27/20  1757     History Chief Complaint  Patient presents with  . Abdominal Pain    Bethany Little is a 34 y.o. female.  The history is provided by the patient. No language interpreter was used.  Abdominal Pain    34 year old female significant history of leukemia, cannabis use, pseudoseizure, chronic pelvic pain, presenting for evaluation of abdominal pain.  Patient report gradual onset of progressive worsening pain to the right lower quadrant that started since yesterday.  Pain is described as a sharp throbbing achy sensation radiates towards the back worsening with movement.  She also endorsed nausea, and vomiting nonbloody nonbilious contents.  She endorsed constipation.  She report decrease in appetite and report feeling feverish.  She does not complain of any chest pain or shortness of breath no dysuria or hematuria no vaginal bleeding or vaginal discharge.  She has an intact appendix.  She has Zofran at home that she takes to help her with her nausea.  She is currently rates her pain as a 7 out of 10.  Past Medical History:  Diagnosis Date  . Anxiety   . Arthritis   . Cervical abnormality   . Cervical cancer ([place]) 2003  . Depression   . Heartburn   . Hypertension   . Interstitial cystitis   . Leukemia ([place])    Remission for 23 yrs  . Seizures ([place])   . Sleep apnea     Patient Active Problem List   Diagnosis Date Noted  . Chronic intractable headache 05/31/2018  . Loss of memory 04/26/2018  . Mood change 04/26/2018  . Seizure ([place]) 03/22/2016  . 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 Unspecified depressive disorder. 12/21/2015  . Cannabis use disorder, moderate, dependence ([place]) 12/21/2015  . Abnormal uterine bleeding 09/30/2015  . Chronic interstitial cystitis 09/30/2015  . Chronic pelvic pain in female 09/30/2015  . Cystitis, interstitial 01/08/2015  . Elevated blood pressure  04/06/2012  . Leukemia ([place]) 04/04/2012  . Amitriptyline overdose 04/03/2012  . Suicidal intent 04/03/2012  . 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 CARDIAC CATHETERIZATION     73 weeks old  . CESAREAN SECTION    . LEEP    . TUBAL LIGATION       OB History   No obstetric history on file.     Family History  Problem Relation Age of Onset  . Depression Mother   . Bipolar disorder Mother   . COPD Mother   . Hypertension Mother   . Mental illness Mother   . Kidney disease Father        partial kidney fx  . COPD Father   . Pancreatic cancer Paternal Grandmother   . Celiac disease Paternal Grandmother   . Hypertension Paternal Grandmother   . Heart failure Paternal Grandfather   . Prostate cancer Other   . Healthy Son   . Bladder Cancer Neg Hx     Social History   Tobacco Use  . Smoking status: Current Some Day Smoker    Packs/day: 1.00    Years: 10.00    Pack years: 10.00    Types: Cigarettes  . Smokeless tobacco: Never Used  Vaping Use  . Vaping Use: Never used  Substance Use Topics  . Alcohol use: Not Currently  . Drug use: Yes    Types: Marijuana    Home  Medications Prior to Admission medications   Medication Sig Start Date End Date Taking? Authorizing Provider  AJOVY 225 MG/1.5ML SOSY INJECT 225 MG SUBCUTANEOUSLY EVERY 30 DAYS Patient not taking: Reported on 01/25/2020 09/28/19   Pieter Partridge, DO  cyclobenzaprine (FLEXERIL) 10 MG tablet Take 10 mg by mouth 3 (three) times daily as needed for muscle spasms.    [provider]  diphenhydrAMINE (BENADRYL) 25 mg capsule Take 25 mg by mouth.    [provider]  lamoTRIgine (LAMICTAL) 200 MG tablet Take 1 tablet (200 mg total) by mouth 2 (two) times daily. 01/25/20   Cameron Sprang, MD  ondansetron (ZOFRAN-ODT) 4 MG disintegrating tablet TAKE 1 TABLET BY MOUTH EVERY 8 HOURS AS NEEDED FOR NAUSEA OR VOMITING 09/28/19   Pieter Partridge,  DO  SUMAtriptan (IMITREX) 6 MG/0.5ML SOLN injection Inject 0.5 mLs (6 mg total) into the skin every 2 (two) hours as needed for migraine or headache. May repeat in 2 hours if headache persists or recurs. 06/22/18   Tomi Likens, Adam R, DO  SUMAtriptan 6 MG/0.5ML SOAJ INJECT 0.5 ML INTO THE SKIN EVERY 2 HOURS AS NEEDED FOR MIGRAINE OR HEADACHEMAY REPEAT IN 2 HOURS IF HEADACHE PERSISTS OR RECURS 07/02/19   Tomi Likens, Adam R, DO  topiramate (TOPAMAX) 100 MG tablet Take 1 tablet (100 mg total) by mouth 2 (two) times daily. Patient not taking: Reported on 01/25/2020 10/02/19   Pieter Partridge, DO  Ubrogepant (UBRELVY) 100 MG TABS Take 1 tablet for migraine.  May repeat in 2 hours if needed.  Maximum 2 tablets in 24 hours. 01/10/20   Pieter Partridge, DO    Allergies    Tetracycline hcl, Erythromycin, Pertussis vaccines, Reglan [metoclopramide], Tetracyclines & related, and Latex  Review of Systems   Review of Systems  Gastrointestinal: Positive for abdominal pain.  All other systems reviewed and are negative.   Physical Exam Updated Vital Signs BP (!) 132/93 (BP Location: Right Arm)   Pulse 96   Temp 99.2 F (37.3 C) (Oral)   Resp 18   Ht 5\' 4"  (1.626 m)   Wt 71.2 kg   SpO2 100%   BMI 26.95 kg/m   Physical Exam Vitals and nursing note reviewed.  Constitutional:      General: She is not in acute distress.    Appearance: She is well-developed.  HENT:     Head: Atraumatic.  Eyes:     Conjunctiva/sclera: Conjunctivae normal.  Cardiovascular:     Rate and Rhythm: Normal rate and regular rhythm.  Pulmonary:     Effort: Pulmonary effort is normal.     Breath sounds: Normal breath sounds.  Abdominal:     General: Abdomen is flat. Bowel sounds are normal.     Palpations: Abdomen is soft.     Tenderness: There is abdominal tenderness in the right lower quadrant. There is guarding. There is no rebound. Positive signs include McBurney's sign. Negative signs include Murphy's sign and Rovsing's sign.   Musculoskeletal:     Cervical back: Neck supple.  Skin:    Findings: No rash.  Neurological:     Mental Status: She is alert.     ED Results / Procedures / Treatments   Labs (all labs ordered are listed, but only abnormal results are displayed) Labs Reviewed  WET PREP, GENITAL - Abnormal; Notable for the following components:      Result Value   Yeast Wet Prep HPF POC PRESENT (*)    Clue Cells Wet  Prep HPF POC PRESENT (*)    WBC, Wet Prep HPF POC MODERATE (*)    All other components within normal limits  CBC - Abnormal; Notable for the following components:   WBC 11.3 (*)    HCT 47.0 (*)    All other components within normal limits  URINALYSIS, ROUTINE W REFLEX MICROSCOPIC - Abnormal; Notable for the following components:   APPearance CLOUDY (*)    Leukocytes,Ua LARGE (*)    Bacteria, UA RARE (*)    All other components within normal limits  SARS CORONAVIRUS 2 BY RT PCR (HOSPITAL ORDER, [place] [facility])  LIPASE, BLOOD  COMPREHENSIVE METABOLIC PANEL  GC/CHLAMYDIA PROBE AMP ([place]) NOT AT [facility]    EKG None  Radiology CT ABDOMEN PELVIS W CONTRAST  Result Date: 01/27/2020 CLINICAL DATA:  Right lower quadrant abdominal pain EXAM: CT ABDOMEN AND PELVIS WITH CONTRAST TECHNIQUE: Multidetector CT imaging of the abdomen and pelvis was performed using the standard protocol following bolus administration of intravenous contrast. CONTRAST:  14mL OMNIPAQUE IOHEXOL 300 MG/ML  SOLN COMPARISON:  12/26/2015 FINDINGS: Lower chest: No acute abnormality. Hepatobiliary: No focal liver abnormality is seen. No gallstones, gallbladder wall thickening, or biliary dilatation. Pancreas: Unremarkable Spleen: Unremarkable Adrenals/Urinary Tract: Adrenal glands are unremarkable. Kidneys are normal, without renal calculi, focal lesion, or hydronephrosis. Bladder is unremarkable. Stomach/Bowel: The stomach, small bowel, and large bowel are unremarkable. The appendix is hyperemic  and, while not overtly dilated, is thickened when compared to prior examination and is suspicious for changes of early appendicitis in the setting of right lower quadrant pain. No evidence of obstruction. Mild free fluid within the pelvis may be physiologic in a female patient of this age. No free intraperitoneal gas. Vascular/Lymphatic: No significant vascular findings are present. No enlarged abdominal or pelvic lymph nodes. Reproductive: Bilateral corpus lutea are noted. The pelvic organs are otherwise normal. Other: Rectum unremarkable Musculoskeletal: No acute bone abnormality IMPRESSION: The appendix is hyperemic and, while not overtly dilated, is thickened when compared to prior examination and is suspicious for changes of early appendicitis in the setting of right lower quadrant pain. Appendix: Location: Inferior to cecum Diameter: 8 mm Appendicolith: No Mucosal hyper-enhancement: Yes Extraluminal gas: No Periappendiceal collection: No Electronically Signed   By: Fidela Salisbury MD   On: 01/27/2020 21:20    Procedures Pelvic exam  Date/Time: 01/27/2020 8:37 PM Performed by: Domenic Moras, PA-C Authorized by: Domenic Moras, PA-C  Comments: Ginger, RN was available as chaperone.  No inguinal lymphadenopathy or inguinal hernia noted.  Normal external genitalia.  Mild discomfort with speculum insertion.  Cervical os is closed, normal functional vaginal discharge.  On bimanual examination, no significant adnexal tenderness or cervical motion tenderness.    (including critical care time)  Medications Ordered in ED Medications  cefTRIAXone (ROCEPHIN) 2 g in sodium chloride 0.9 % 100 mL IVPB (has no administration in time range)    And  metroNIDAZOLE (FLAGYL) IVPB 500 mg (has no administration in time range)  ondansetron (ZOFRAN-ODT) disintegrating tablet 4 mg (4 mg Oral Given 01/27/20 1820)  morphine 4 MG/ML injection 4 mg (4 mg Intravenous Given 01/27/20 2024)  sodium chloride 0.9 % bolus 1,000 mL (1,000  mLs Intravenous New Bag/Given 01/27/20 2023)  iohexol (OMNIPAQUE) 300 MG/ML solution 100 mL (100 mLs Intravenous Contrast Given 01/27/20 2058)  morphine 4 MG/ML injection 4 mg (4 mg Intravenous Given 01/27/20 2159)    ED Course  I have reviewed the triage vital signs and the  nursing notes.  Pertinent labs & imaging results that were available during my care of the patient were reviewed by me and considered in my medical decision making (see chart for details).    MDM Rules/Calculators/A&P                          BP 118/82   Pulse 82   Temp 99.2 F (37.3 C) (Oral)   Resp 18   Ht 5\' 4"  (1.626 m)   Wt 71.2 kg   SpO2 100%   BMI 26.95 kg/m   Final Clinical Impression(s) / ED Diagnoses Final diagnoses:  Acute appendicitis with localized peritonitis, without perforation, abscess, or gangrene    Rx / DC Orders ED Discharge Orders    None     8:14 PM Patient here with complaints of pain to her right lower quadrant abdomen.  Symptom concerning for appendicitis.  Work-up initiated.  Pelvic exam performed low suspicion for PID.  9:40 PM Wet prep shows evidence of yeast infection, evidence of clue cells, and moderate WBC.  UA shows cloudy urine with large leukocyte esterase along with 6-10 WBC.  Patient however without any urinary symptoms to suggest UTI.  White count is elevated at 11.2.  CT scan of the abdomen and pelvis demonstrate the appendix is hyperemic and although not overtly dilated it is thickened when compared to prior examination and is suspicious for changes of early appendicitis.  We will consult general surgery for further management.  Additional pain medication given.  Patient's last meal was 8 hours ago.  Screening Covid test ordered.  10:00 PM Appreciate consultation from general surgery, Dr. Constance Haw, who request medicine for admission and IV antibiotic.  Plan for appendectomy tomorrow.  10:14 PM Appreciate consultation from [facility] Dr. Myna Hidalgo who agrees to  see and admit pt for further management.  I have initiated abx including rocephin and metronidazole.  Additional pain medication provided.    Samirah SALEENA TAMAS was evaluated in Emergency Department on 01/27/2020 for the symptoms described in the history of present illness. She was evaluated in the context of the global COVID-19 pandemic, which necessitated consideration that the patient might be at risk for infection with the SARS-CoV-2 virus that causes COVID-19. Institutional protocols and algorithms that pertain to the evaluation of patients at risk for COVID-19 are in a state of rapid change based on information released by regulatory bodies including the CDC and federal and state organizations. These policies and algorithms were followed during the patient's care in the ED.    Domenic Moras, PA-C 01/27/20 2215    Maudie Flakes, MD 01/27/20 2255

## 2020-01-27 NOTE — ED Notes (Signed)
To CT

## 2020-01-28 ENCOUNTER — Inpatient Hospital Stay (HOSPITAL_COMMUNITY): Payer: Medicaid Other | Admitting: Anesthesiology

## 2020-01-28 ENCOUNTER — Encounter (HOSPITAL_COMMUNITY)
Admission: EM | Disposition: A | Discharge status: Home / Self Care | Payer: Self-pay | Source: Home / Self Care | Attending: General Surgery

## 2020-01-28 ENCOUNTER — Encounter (HOSPITAL_COMMUNITY): Payer: Self-pay | Admitting: Family Medicine

## 2020-01-28 DIAGNOSIS — K353 Acute appendicitis with localized peritonitis, without perforation or gangrene: Principal | ICD-10-CM

## 2020-01-28 HISTORY — PX: LAPAROSCOPIC APPENDECTOMY: SHX408

## 2020-01-28 LAB — CBC
HCT: 38.9 % (ref 36.0–46.0)
Hemoglobin: 12.4 g/dL (ref 12.0–15.0)
MCH: 32 pg (ref 26.0–34.0)
MCHC: 31.9 g/dL (ref 30.0–36.0)
MCV: 100.5 fL — ABNORMAL HIGH (ref 80.0–100.0)
Platelets: 213 10*3/uL (ref 150–400)
RBC: 3.87 MIL/uL (ref 3.87–5.11)
RDW: 13.8 % (ref 11.5–15.5)
WBC: 9.4 10*3/uL (ref 4.0–10.5)
nRBC: 0 % (ref 0.0–0.2)

## 2020-01-28 LAB — HEPATIC FUNCTION PANEL
ALT: 17 U/L (ref 0–44)
AST: 14 U/L — ABNORMAL LOW (ref 15–41)
Albumin: 3.5 g/dL (ref 3.5–5.0)
Alkaline Phosphatase: 41 U/L (ref 38–126)
Bilirubin, Direct: <0.1 mg/dL (ref 0.0–0.2)
Total Bilirubin: 0.3 mg/dL (ref 0.3–1.2)
Total Protein: 6 g/dL — ABNORMAL LOW (ref 6.5–8.1)

## 2020-01-28 LAB — BASIC METABOLIC PANEL
Anion gap: 7 (ref 5–15)
BUN: 7 mg/dL (ref 6–20)
CO2: 23 mmol/L (ref 22–32)
Calcium: 8.1 mg/dL — ABNORMAL LOW (ref 8.9–10.3)
Chloride: 108 mmol/L (ref 98–111)
Creatinine, Ser: 0.74 mg/dL (ref 0.44–1.00)
GFR calc Af Amer: >60 mL/min (ref >60)
GFR calc non Af Amer: >60 mL/min (ref >60)
Glucose, Bld: 94 mg/dL (ref 70–99)
Potassium: 3.5 mmol/L (ref 3.5–5.1)
Sodium: 138 mmol/L (ref 135–145)

## 2020-01-28 LAB — POC URINE PREG, ED: Preg Test, Ur: NEGATIVE

## 2020-01-28 LAB — MAGNESIUM: Magnesium: 1.9 mg/dL (ref 1.7–2.4)

## 2020-01-28 LAB — HIV ANTIBODY (ROUTINE TESTING W REFLEX): HIV Screen 4th Generation wRfx: NONREACTIVE

## 2020-01-28 SURGERY — APPENDECTOMY, LAPAROSCOPIC
Anesthesia: General | Site: Abdomen

## 2020-01-28 MED ORDER — GLYCOPYRROLATE PF 0.2 MG/ML IJ SOSY
PREFILLED_SYRINGE | INTRAMUSCULAR | Status: AC
  Filled 2020-01-28: qty 1

## 2020-01-28 MED ORDER — ROCURONIUM 10MG/ML (10ML) SYRINGE FOR MEDFUSION PUMP - OPTIME
INTRAVENOUS | Status: DC | PRN
  Administered 2020-01-28: 40 mg via INTRAVENOUS

## 2020-01-28 MED ORDER — MIDAZOLAM HCL 2 MG/2ML IJ SOLN
INTRAMUSCULAR | Status: AC
  Filled 2020-01-28: qty 2

## 2020-01-28 MED ORDER — MORPHINE SULFATE (PF) 2 MG/ML IV SOLN: 2.0000 mg | INTRAVENOUS | Status: DC | PRN

## 2020-01-28 MED ORDER — BUPIVACAINE LIPOSOME 1.3 % IJ SUSP
INTRAMUSCULAR | Status: DC | PRN
  Administered 2020-01-28: 20 mL

## 2020-01-28 MED ORDER — HYDROMORPHONE HCL 1 MG/ML IJ SOLN
0.2500 mg | INTRAMUSCULAR | Status: DC | PRN
  Administered 2020-01-28 (×2): 0.5 mg via INTRAVENOUS
  Filled 2020-01-28 (×2): qty 0.5

## 2020-01-28 MED ORDER — FENTANYL CITRATE (PF) 100 MCG/2ML IJ SOLN
INTRAMUSCULAR | Status: AC
  Filled 2020-01-28: qty 2

## 2020-01-28 MED ORDER — SODIUM CHLORIDE 0.9 % IR SOLN
Status: DC | PRN
  Administered 2020-01-28: 1000 mL

## 2020-01-28 MED ORDER — NEOSTIGMINE METHYLSULFATE 3 MG/3ML IV SOSY
PREFILLED_SYRINGE | INTRAVENOUS | Status: AC
  Filled 2020-01-28: qty 3

## 2020-01-28 MED ORDER — DEXTROSE-NACL 5-0.45 % IV SOLN: INTRAVENOUS | Status: DC

## 2020-01-28 MED ORDER — DOCUSATE SODIUM 100 MG PO CAPS: 100.0000 mg | ORAL_CAPSULE | Freq: Two times a day (BID) | ORAL | 2 refills | Status: DC | PRN

## 2020-01-28 MED ORDER — CHLORHEXIDINE GLUCONATE CLOTH 2 % EX PADS: 6.0000 | MEDICATED_PAD | Freq: Once | CUTANEOUS | Status: DC

## 2020-01-28 MED ORDER — FENTANYL CITRATE (PF) 100 MCG/2ML IJ SOLN
INTRAMUSCULAR | Status: DC | PRN
Start: 2020-01-28 — End: 2020-01-28
  Administered 2020-01-28 (×4): 50 ug via INTRAVENOUS

## 2020-01-28 MED ORDER — KETOROLAC TROMETHAMINE 30 MG/ML IJ SOLN
INTRAMUSCULAR | Status: AC
  Filled 2020-01-28: qty 1

## 2020-01-28 MED ORDER — SODIUM CHLORIDE 0.9 % IV SOLN: 2.0000 g | INTRAVENOUS | Status: DC

## 2020-01-28 MED ORDER — MIDAZOLAM HCL 5 MG/5ML IJ SOLN
INTRAMUSCULAR | Status: DC | PRN
  Administered 2020-01-28: 2 mg via INTRAVENOUS

## 2020-01-28 MED ORDER — LACTATED RINGERS IV SOLN: INTRAVENOUS | Status: DC

## 2020-01-28 MED ORDER — PROMETHAZINE HCL 25 MG/ML IJ SOLN
INTRAMUSCULAR | Status: AC
  Filled 2020-01-28: qty 1

## 2020-01-28 MED ORDER — PROMETHAZINE HCL 25 MG/ML IJ SOLN
6.2500 mg | INTRAMUSCULAR | Status: DC | PRN
  Administered 2020-01-28: 12.5 mg via INTRAVENOUS
  Filled 2020-01-28: qty 1

## 2020-01-28 MED ORDER — PROPOFOL 10 MG/ML IV BOLUS
INTRAVENOUS | Status: DC | PRN
  Administered 2020-01-28: 200 mg via INTRAVENOUS

## 2020-01-28 MED ORDER — LACTATED RINGERS IV SOLN: INTRAVENOUS | Status: DC | PRN

## 2020-01-28 MED ORDER — KETOROLAC TROMETHAMINE 30 MG/ML IJ SOLN
INTRAMUSCULAR | Status: DC | PRN
  Administered 2020-01-28: 30 mg via INTRAVENOUS

## 2020-01-28 MED ORDER — OXYCODONE HCL 5 MG PO TABS
5.0000 mg | ORAL_TABLET | ORAL | Status: DC | PRN
  Administered 2020-01-28: 5 mg via ORAL
  Filled 2020-01-28: qty 1

## 2020-01-28 MED ORDER — CHLORHEXIDINE GLUCONATE 0.12 % MT SOLN: 15.0000 mL | Freq: Once | OROMUCOSAL | Status: DC

## 2020-01-28 MED ORDER — BUPIVACAINE LIPOSOME 1.3 % IJ SUSP
INTRAMUSCULAR | Status: AC
  Filled 2020-01-28: qty 20

## 2020-01-28 MED ORDER — GLYCOPYRROLATE 0.2 MG/ML IJ SOLN
INTRAMUSCULAR | Status: DC | PRN
  Administered 2020-01-28: .2 mg via INTRAVENOUS

## 2020-01-28 MED ORDER — NEOSTIGMINE METHYLSULFATE 10 MG/10ML IV SOLN
INTRAVENOUS | Status: DC | PRN
  Administered 2020-01-28: 3 mg via INTRAVENOUS

## 2020-01-28 MED ORDER — PROMETHAZINE HCL 25 MG/ML IJ SOLN
INTRAMUSCULAR | Status: DC | PRN
  Administered 2020-01-28: 12.5 mg via INTRAVENOUS

## 2020-01-28 MED ORDER — OXYCODONE HCL 5 MG PO TABS
5.0000 mg | ORAL_TABLET | ORAL | 0 refills | Status: DC | PRN
Start: 2020-01-28 — End: 2020-02-01

## 2020-01-28 MED ORDER — ORAL CARE MOUTH RINSE: 15.0000 mL | Freq: Once | OROMUCOSAL | Status: DC

## 2020-01-28 MED ORDER — PROPOFOL 10 MG/ML IV BOLUS
INTRAVENOUS | Status: AC
  Filled 2020-01-28: qty 20

## 2020-01-28 SURGICAL SUPPLY — 45 items
BAG RETRIEVAL 10 (BASKET) ×1
BAG RETRIEVAL 10MM (BASKET) ×1
BLADE SURG 15 STRL LF DISP TIS (BLADE) ×1 IMPLANT
BLADE SURG 15 STRL SS (BLADE) ×2
CHLORAPREP W/TINT 26 (MISCELLANEOUS) ×3 IMPLANT
CLOTH BEACON ORANGE TIMEOUT ST (SAFETY) ×3 IMPLANT
COVER LIGHT HANDLE STERIS (MISCELLANEOUS) ×6 IMPLANT
COVER WAND RF STERILE (DRAPES) ×3 IMPLANT
CUTTER FLEX LINEAR 45M (STAPLE) ×3 IMPLANT
DERMABOND ADVANCED (GAUZE/BANDAGES/DRESSINGS) ×2
DERMABOND ADVANCED .7 DNX12 (GAUZE/BANDAGES/DRESSINGS) ×1 IMPLANT
ELECT REM PT RETURN 9FT ADLT (ELECTROSURGICAL) ×3
ELECTRODE REM PT RTRN 9FT ADLT (ELECTROSURGICAL) ×1 IMPLANT
GLOVE BIOGEL PI IND STRL 6.5 (GLOVE) ×1 IMPLANT
GLOVE BIOGEL PI IND STRL 7.0 (GLOVE) ×3 IMPLANT
GLOVE BIOGEL PI INDICATOR 6.5 (GLOVE) ×2
GLOVE BIOGEL PI INDICATOR 7.0 (GLOVE) ×6
GLOVE SURG SS PI 6.5 STRL IVOR (GLOVE) ×6 IMPLANT
GOWN STRL REUS W/TWL LRG LVL3 (GOWN DISPOSABLE) ×6 IMPLANT
INST SET LAPROSCOPIC AP (KITS) ×3 IMPLANT
KIT TURNOVER KIT A (KITS) ×3 IMPLANT
MANIFOLD NEPTUNE II (INSTRUMENTS) ×3 IMPLANT
NEEDLE HYPO 18GX1.5 BLUNT FILL (NEEDLE) ×3 IMPLANT
NEEDLE HYPO 22GX1.5 SAFETY (NEEDLE) ×3 IMPLANT
NEEDLE INSUFFLATION 14GA 120MM (NEEDLE) ×3 IMPLANT
NS IRRIG 1000ML POUR BTL (IV SOLUTION) ×3 IMPLANT
PACK LAP CHOLE LZT030E (CUSTOM PROCEDURE TRAY) ×3 IMPLANT
PAD ARMBOARD 7.5X6 YLW CONV (MISCELLANEOUS) ×3 IMPLANT
RELOAD 45 VASCULAR/THIN (ENDOMECHANICALS) IMPLANT
RELOAD STAPLE TA45 3.5 REG BLU (ENDOMECHANICALS) ×3 IMPLANT
SET BASIN LINEN APH (SET/KITS/TRAYS/PACK) ×3 IMPLANT
SET TUBE IRRIG SUCTION NO TIP (IRRIGATION / IRRIGATOR) IMPLANT
SET TUBE SMOKE EVAC HIGH FLOW (TUBING) ×3 IMPLANT
SHEARS HARMONIC ACE PLUS 36CM (ENDOMECHANICALS) ×3 IMPLANT
SUT MNCRL AB 4-0 PS2 18 (SUTURE) ×6 IMPLANT
SUT VICRYL 0 UR6 27IN ABS (SUTURE) ×3 IMPLANT
SYR 20ML LL LF (SYRINGE) ×6 IMPLANT
SYS BAG RETRIEVAL 10MM (BASKET) ×1
SYSTEM BAG RETRIEVAL 10MM (BASKET) ×1 IMPLANT
TRAY FOLEY SLVR 16FR LF STAT (SET/KITS/TRAYS/PACK) ×3 IMPLANT
TROCAR ENDO BLADELESS 11MM (ENDOMECHANICALS) ×3 IMPLANT
TROCAR ENDO BLADELESS 12MM (ENDOMECHANICALS) ×3 IMPLANT
TROCAR XCEL NON-BLD 5MMX100MML (ENDOMECHANICALS) ×3 IMPLANT
WARMER LAPAROSCOPE (MISCELLANEOUS) ×3 IMPLANT
YANKAUER SUCT 12FT TUBE ARGYLE (SUCTIONS) ×3 IMPLANT

## 2020-01-28 NOTE — Op Note (Signed)
[facility]  Date of Surgery: 01/28/2020  Admit Date: 01/27/2020   Performing Service: General  Surgeon(s) and Role:    * Virl Cagey, MD - Primary   Pre-operative Diagnosis: Acute Appendicitis  Post-operative Diagnosis: Possible Early Acute Appendicitis  Procedure Performed: Laparoscopic Appendectomy   Surgeon: Lanell Matar. Constance Haw, MD   Assistant: No qualified resident was available.   Anesthesia: General   Findings:  The appendix was found to be dilated and thickened in the mid portion but no major inflammation. There were not signs of necrosis. There was not perforation. There was not abscess formation.  The uterus looked a little hyperemic but no purulence or other abnormality noted.        Estimated Blood Loss: Minimal   Specimens:  ID Type Source Tests Collected by Time Destination  1 : appendix Tissue PATH Appendix SURGICAL PATHOLOGY Virl Cagey, MD 01/29/9210 9417      Complications: None; patient tolerated the procedure well.   Disposition: PACU - hemodynamically stable.   Condition: stable   Indications: The patient presented with a 2 day history of right-sided abdominal pain. A CT revealed findings consistent with early acute appendicitis.  She had a pelvic exam in the ED that was reported to be unconcerning. GC and chlamydia pending. Wet prep with some clue cells and WBC.   Procedure Details  Prior to the procedure, the risks, benefits, complications, treatment options, and expected outcomes were discussed with the patient and/or family, including but not limited to the risk of bleeding, infection, finding of a normal appendix, and the need for conversion to an open procedure. There was concurrence with the proposed plan and informed consent was obtained. The patient was taken to the operating room, identified as Bethany Little and the procedure verified as Laproscopic Appendectomy.    The patient was placed in the supine  position and general anesthesia was induced, along with placement of orogastric tube, SCD's, and a Foley catheter. The abdomen was prepped and draped in a sterile fashion. The abdomen was entered with Veress technique in the supraumbilical incision. Intraperitoneal placement was confirmed with saline drop, low entry pressures, and easy insufflation. A 11 mm optiview trocar was placed under direct visualization with a 0 degree scope. The 10 mm 0 degree scope was placed in the abdomen and no evidence of injury was identified. A 12 mm port was placed in the left lower quadrant of the abdomen after skin incision with trocar placement under direct vision. A careful evaluation of the entire abdomen was carried out. An additional 5 mm port was placed in the suprapubic area under direct vision.  The patient was placed in Trendelenburg and left lateral decubitus position. The small intestines were retracted in the cephalad and left lateral direction away from the pelvis and right lower quadrant. The patient was found to have an dilated appendix with some thickening in the mid appendix. There was not evidence of perforation.  The uterus was a little hyperemic but no purulence and normal ovaries.   The appendix was carefully dissected. A window was made in the mesoappendix at the base of the appendix. The appendix was divided at its base using another tan endo-GIA stapler. Minimal appendiceal stump was left in place. The mesoappendix was taken with the harmonic energy device. The appendix was placed within an Endocatch specimen bag. There was no evidence of bleeding, leakage, or complication after division of the appendix.  Any remaining blood or pus was suctioned out  from the abdomen, hemostasis was confirmed. The endocatch bag was removed via the 12 mm port, then the abdomen desufflated. The appendix was passed off the field as a specimen.   The the 12 mm and 10 mm port sites were closed with a 0 Vicryl suture. The  trocar site skin wounds were closed using subcuticular 4-0 Monocryl suture and dermabond. The patient was then awakened from general anesthesia, extubated, and taken to PACU for recovery.   Instrument, sponge, and needle counts were correct at the conclusion of the case.   Curlene Labrum, MD [facility] [address] [place], [place] [address] [phone]([phone])

## 2020-01-28 NOTE — Progress Notes (Addendum)
[facility]  Spoke with Dr. Elgie Congo on call for Gyn. Discussed hyperemic uterus but otherwise normal on CT. Discussed pending GC/ Chlamydia. No further work up pending now. Hyperemic uterus non specific. Appreciate his thoughts.  GC/ Chlamydia from the ED is pending. Appendix was dilated and somewhat thickened but was not overtly inflamed. May not be acute appendicitis.   Discussed surgery with husband and possibility of not being appendicitis. Did not discuss any PID issues with husband as I will discuss that with the patient.   Patient can go today if feeling ok later this afternoon. Is having some post op nausea now.   Curlene Labrum, MD [facility] [address] [place], [place] [address] [phone] (office)

## 2020-01-28 NOTE — Progress Notes (Signed)
Nsg Discharge Note  Admit Date:  01/27/2020 Discharge date: 01/28/2020   Tanna Savoy to be D/C'd Home per MD order.  AVS completed.  Copy for chart, and copy for patient signed, and dated. Removed IV-clean, dry, intact. Reviewed d/c paperwork with patient and husband. Answered all questions. Wheeled stable patient and belongings to main entrance where she was picked up by her husband to d/c to home. Patient/caregiver able to verbalize understanding.  Discharge Medication: Allergies as of 01/28/2020      Reactions   Tetracycline Hcl Hives   Childhood allergy   Erythromycin Hives   Childhood allergy   Pertussis Vaccines Hives   Childhood allergy   Reglan [metoclopramide] Other (See Comments)   Muscle Spasms   Tetracyclines & Related Hives   Childhood allergy   Latex Itching, Rash      Medication List    TAKE these medications   Ajovy 225 MG/1.5ML Sosy Generic drug: Fremanezumab-vfrm INJECT 225 MG SUBCUTANEOUSLY EVERY 30 DAYS   cyclobenzaprine 10 MG tablet Commonly known as: FLEXERIL Take 10 mg by mouth 3 (three) times daily as needed for muscle spasms.   diphenhydrAMINE 25 mg capsule Commonly known as: BENADRYL Take 25 mg by mouth.   docusate sodium 100 MG capsule Commonly known as: Colace Take 1 capsule (100 mg total) by mouth 2 (two) times daily as needed for mild constipation (while taking narcotics).   lamoTRIgine 200 MG tablet Commonly known as: LAMICTAL Take 1 tablet (200 mg total) by mouth 2 (two) times daily.   ondansetron 4 MG disintegrating tablet Commonly known as: ZOFRAN-ODT TAKE 1 TABLET BY MOUTH EVERY 8 HOURS AS NEEDED FOR NAUSEA OR VOMITING What changed: See the new instructions.   oxyCODONE 5 MG immediate release tablet Commonly known as: Oxy IR/ROXICODONE Take 1 tablet (5 mg total) by mouth every 4 (four) hours as needed for severe pain or breakthrough pain.   SUMAtriptan 6 MG/0.5ML Soln injection Commonly known as: IMITREX Inject 0.5 mLs (6  mg total) into the skin every 2 (two) hours as needed for migraine or headache. May repeat in 2 hours if headache persists or recurs.   topiramate 100 MG tablet Commonly known as: TOPAMAX Take 1 tablet (100 mg total) by mouth 2 (two) times daily.   Ubrelvy 100 MG Tabs Generic drug: Ubrogepant Take 1 tablet for migraine.  May repeat in 2 hours if needed.  Maximum 2 tablets in 24 hours.       Discharge Assessment: Vitals:   01/28/20 1230 01/28/20 1429  BP: 90/66 (!) 101/58  Pulse: 64 68  Resp: 11 16  Temp:  98.2 F (36.8 C)  SpO2: 97% 100%   Skin clean, dry and intact without evidence of skin break down, no evidence of skin tears noted. IV catheter discontinued intact. Site without signs and symptoms of complications - no redness or edema noted at insertion site, patient denies c/o pain - only slight tenderness at site.  Dressing with slight pressure applied.  D/c Instructions-Education: Discharge instructions given to patient/family with verbalized understanding. D/c education completed with patient/family including follow up instructions, medication list, d/c activities limitations if indicated, with other d/c instructions as indicated by MD - patient able to verbalize understanding, all questions fully answered. Patient instructed to return to ED, call 911, or call MD for any changes in condition.  Patient escorted via [place], and D/C home via private auto.  Santa Lighter, RN 01/28/2020 5:47 PM

## 2020-01-28 NOTE — Discharge Instructions (Signed)
Test from the ED from the pelvic exam will not be back for probably 1-2 more days.  Some one should call you with the results.   Discharge Laparoscopic Surgery Instructions:  Common Complaints: Right shoulder pain is common after laparoscopic surgery. This is secondary to the gas used in the surgery being trapped under the diaphragm.  Walk to help your body absorb the gas. This will improve in a few days. Pain at the port sites are common, especially the larger port sites. This will improve with time.  Some nausea is common and poor appetite. The main goal is to stay hydrated the first few days after surgery.   Diet/ Activity: Diet as tolerated. You may not have an appetite, but it is important to stay hydrated. Drink 64 ounces of water a day. Your appetite will return with time.  Shower per your regular routine daily.  Do not take hot showers. Take warm showers that are less than 10 minutes. Rest and listen to your body, but do not remain in bed all day.  Walk everyday for at least 15-20 minutes. Deep cough and move around every 1-2 hours in the first few days after surgery.  Do not lift > 10 lbs, perform excessive bending, pushing, pulling, squatting for 1-2 weeks after surgery.  Do not pick at the dermabond glue on your incision sites.  This glue film will remain in place for 1-2 weeks and will start to peel off.  Do not place lotions or balms on your incision unless instructed to specifically by Dr. Constance Haw.   Pain Expectations and Narcotics: -After surgery you will have pain associated with your incisions and this is normal. The pain is muscular and nerve pain, and will get better with time. -You are encouraged and expected to take non narcotic medications like tylenol and ibuprofen (when able) to treat pain as multiple modalities can aid with pain treatment. -Narcotics are only used when pain is severe or there is breakthrough pain. -You are not expected to have a pain score of 0 after  surgery, as we cannot prevent pain. A pain score of 3-4 that allows you to be functional, move, walk, and tolerate some activity is the goal. The pain will continue to improve over the days after surgery and is dependent on your surgery. -Due to [place] law, we are only able to give a certain amount of pain medication to treat post operative pain, and we only give additional narcotics on a patient by patient basis.  -For most laparoscopic surgery, studies have shown that the majority of patients only need 10-15 narcotic pills, and for open surgeries most patients only need 15-20.   -Having appropriate expectations of pain and knowledge of pain management with non narcotics is important as we do not want anyone to become addicted to narcotic pain medication.  -Using ice packs in the first 48 hours and heating pads after 48 hours, wearing an abdominal binder (when recommended), and using over the counter medications are all ways to help with pain management.   -Simple acts like meditation and mindfulness practices after surgery can also help with pain control and research has proven the benefit of these practices.  Medication:  Ibuprofen 800mg  @ 9am, 3pm, 9pm, 3am (Do not exceed 3600mg  of ibuprofen a day).  Take Roxicodone for breakthrough pain every 4 hours.  Take Colace for constipation related to narcotic pain medication. If you do not have a bowel movement in 2 days, take Miralax over the  counter.  Drink plenty of water to also prevent constipation.   Contact Information: If you have questions or concerns, please call our office, [phone], Monday- Thursday 8AM-5PM and Friday 8AM-12Noon.  If it is after hours or on the weekend, please call [facility], [phone], and ask to speak to the surgeon on call for Dr. Constance Haw at [facility].    Laparoscopic Appendectomy, Adult, Care After This sheet gives you information about how to care for yourself after your procedure. Your health care  provider may also give you more specific instructions. If you have problems or questions, contact your health care provider. What can I expect after the procedure? After the procedure, it is common to have:  Little energy for normal activities.  Mild pain in the area where the incisions were made.  Difficulty passing stool (constipation). This can be caused by: ? Pain medicine. ? A decrease in your activity. Follow these instructions at home: Medicines  Take over-the-counter and prescription medicines only as told by your health care provider.  If you were prescribed an antibiotic medicine, take it as told by your health care provider. Do not stop taking the antibiotic even if you start to feel better.  Do not drive or use heavy machinery while taking prescription pain medicine.  Ask your health care provider if the medicine prescribed to you can cause constipation. You may need to take steps to prevent or treat constipation, such as: ? Drink enough fluid to keep your urine pale yellow. ? Take over-the-counter or prescription medicines. ? Eat foods that are high in fiber, such as beans, whole grains, and fresh fruits and vegetables. ? Limit foods that are high in fat and processed sugars, such as fried or sweet foods. Incision care   Follow instructions from your health care provider about how to take care of your incisions. Make sure you: ? Wash your hands with soap and water before and after you change your bandage (dressing). If soap and water are not available, use hand sanitizer. ? Change your dressing as told by your health care provider. ? Leave stitches (sutures), skin glue, or adhesive strips in place. These skin closures may need to stay in place for 2 weeks or longer. If adhesive strip edges start to loosen and curl up, you may trim the loose edges. Do not remove adhesive strips completely unless your health care provider tells you to do that.  Check your incision areas  every day for signs of infection. Check for: ? Redness, swelling, or pain. ? Fluid or blood. ? Warmth. ? Pus or a bad smell. Bathing  Keep your incisions clean and dry. Clean them as often as told by your health care provider. To do this: 1. Gently wash the incisions with soap and water. 2. Rinse the incisions with water to remove all soap. 3. Pat the incisions dry with a clean towel. Do not rub the incisions. Do not take baths, swim, or use a hot tub for 3-4weeks, or until your health care provider approves.  You may shower 01/29/2020. Activity   Do not drive for 24 hours if you were given a sedative during your procedure.  Rest after the procedure. Return to your normal activities as told by your health care provider. Ask your health care provider what activities are safe for you.  For 1-2weeks, or for as long as told by your health care provider: ? Do not lift anything that is heavier than 10 lb (4.5 kg), or  the limit that you are told. ? Do not play contact  sports. General instructions  If you were sent home with a drain, follow instructions from your health care provider about how to care for it.  Take deep breaths. This helps to prevent your lungs from developing an infection (pneumonia).  Keep all follow-up visits as told by your health care provider. This is important. Contact a health care provider if:  You have redness, swelling, or pain around an incision.  You have fluid or blood coming from an incision.  Your incision feels warm to the touch.  You have pus or a bad smell coming from an incision or dressing.  Your incision edges break open after your sutures have been removed.  You have increasing pain in your shoulders.  You feel dizzy or you faint.  You develop shortness of breath.  You keep feeling nauseous or you are vomiting.  You have diarrhea or you cannot control your bowel functions.  You lose your appetite.  You develop swelling or pain in  your legs.  You develop a rash. Get help right away if you have:  A fever.  Difficulty breathing.  Sharp pains in your chest. Summary  After a laparoscopic appendectomy, it is common to have little energy for normal activities, mild pain in the area of the incisions, and constipation.  Infection is the most common complication after this procedure. Follow your health care provider's instructions about caring for yourself after the procedure.  Rest after the procedure. Return to your normal activities as told by your health care provider.  Contact your health care provider if you notice signs of infection around your incisions or you develop shortness of breath. Get help right away if you have a fever, chest pain, or difficulty breathing. This information is not intended to replace advice given to you by your health care provider. Make sure you discuss any questions you have with your health care provider. Document Revised: 11/10/2017 Document Reviewed: 11/10/2017 Elsevier Patient Education  [place].  Test

## 2020-01-28 NOTE — Anesthesia Postprocedure Evaluation (Signed)
Anesthesia Post Note  Patient: Bethany Little  Procedure(s) Performed: APPENDECTOMY LAPAROSCOPIC (N/A Abdomen)  Patient location during evaluation: PACU Anesthesia Type: General Level of consciousness: awake Pain management: pain level controlled Vital Signs Assessment: post-procedure vital signs reviewed and stable Respiratory status: spontaneous breathing Cardiovascular status: blood pressure returned to baseline Postop Assessment: no headache Anesthetic complications: no   No complications documented.   Last Vitals:  Vitals:   01/28/20 0830 01/28/20 0900  BP: 99/76 100/64  Pulse: 77 68  Resp:    Temp:    SpO2: 99% (!) 84%    Last Pain:  Vitals:   01/28/20 0815  TempSrc:   PainSc: [place]

## 2020-01-28 NOTE — Anesthesia Procedure Notes (Signed)
Procedure Name: Intubation Date/Time: 01/28/2020 10:25 AM Performed by: Louann Sjogren, MD Pre-anesthesia Checklist: Patient identified, Emergency Drugs available, Suction available, Patient being monitored and Timeout performed Patient Re-evaluated:Patient Re-evaluated prior to induction Oxygen Delivery Method: Circle System Utilized Preoxygenation: Pre-oxygenation with 100% oxygen Induction Type: IV induction Ventilation: Mask ventilation without difficulty Laryngoscope Size: Miller and 2 Grade View: Grade I Tube type: Oral Tube size: 7.0 mm Number of attempts: 1 Airway Equipment and Method: Stylet Placement Confirmation: ETT inserted through vocal cords under direct vision,  positive ETCO2 and breath sounds checked- equal and bilateral Secured at: 21 cm Tube secured with: Tape Dental Injury: Teeth and Oropharynx as per pre-operative assessment

## 2020-01-28 NOTE — Transfer of Care (Signed)
Immediate Anesthesia Transfer of Care Note  Patient: Bethany Little  Procedure(s) Performed: APPENDECTOMY LAPAROSCOPIC (N/A Abdomen)  Patient Location: PACU  Anesthesia Type:General  Level of Consciousness: awake  Airway & Oxygen Therapy: Patient Spontanous Breathing  Post-op Assessment: Report given to RN, Post -op Vital signs reviewed and stable and Patient moving all extremities X 4  Post vital signs: Reviewed and stable  Last Vitals:  Vitals Value Taken Time  BP 122/108 01/28/20 1120  Temp    Pulse 69 01/28/20 1122  Resp 19 01/28/20 1122  SpO2 97 % 01/28/20 1122  Vitals shown include unvalidated device data.  Last Pain:  Vitals:   01/28/20 0815  TempSrc:   PainSc: 7          Complications: No complications documented.

## 2020-01-28 NOTE — Anesthesia Preprocedure Evaluation (Signed)
Anesthesia Evaluation  Patient identified by MRN, date of birth, ID band Patient awake    Reviewed: Allergy & Precautions, H&P , NPO status , Patient's Chart, lab work & pertinent test results, reviewed documented beta blocker date and time   Airway Mallampati: II  TM Distance: >3 FB Neck ROM: full    Dental no notable dental hx.    Pulmonary neg pulmonary ROS, Current Smoker,    Pulmonary exam normal breath sounds clear to auscultation       Cardiovascular Exercise Tolerance: Good hypertension, negative cardio ROS   Rhythm:regular Rate:Normal     Neuro/Psych negative neurological ROS  negative psych ROS   GI/Hepatic negative GI ROS, Neg liver ROS,   Endo/Other  negative endocrine ROS  Renal/GU negative Renal ROS  negative genitourinary   Musculoskeletal   Abdominal   Peds  Hematology negative hematology ROS (+)   Anesthesia Other Findings   Reproductive/Obstetrics negative OB ROS                             Anesthesia Physical Anesthesia Plan  ASA: II and emergent  Anesthesia Plan: General   Post-op Pain Management:    Induction:   PONV Risk Score and Plan: Ondansetron  Airway Management Planned:   Additional Equipment:   Intra-op Plan:   Post-operative Plan:   Informed Consent: I have reviewed the patients History and Physical, chart, labs and discussed the procedure including the risks, benefits and alternatives for the proposed anesthesia with the patient or authorized representative who has indicated his/her understanding and acceptance.     Dental Advisory Given  Plan Discussed with: CRNA  Anesthesia Plan Comments:         Anesthesia Quick Evaluation

## 2020-01-28 NOTE — Discharge Summary (Signed)
Physician Discharge Summary  Patient ID: JACIA SICKMAN MRN: [id number] DOB/AGE: 1986-01-09 34 y.o.  Admit date: 01/27/2020 Discharge date: 01/28/2020  Admission Diagnoses: Acute appendicitis   Discharge Diagnoses:  Principal Problem:   Acute appendicitis Active Problems:   Pseudoseizure   Bacterial vaginosis   Discharged Condition: good  Hospital Course: Ms. Brick is a 34 yo who came in to the ED with right lower quadrant pain and findings concerning for early acute appendicitis. She had a pelvic exam in Ed that was reassuring per their report. GC chlamydia pending. She was taken back to the OR for a laparoscopic appendectomy and did well. She was tolerating a diet and had adequate pain control prior to discharge.   Consults: None and hospitalist admisison- took over by surgery preop  Significant Diagnostic Studies: CT- early appendicitis, thickened dilated appendix   Treatments: Laparoscopic appendectomy 01/28/2020  Discharge Exam: Blood pressure (!) 101/58, pulse 68, temperature 98.2 F (36.8 C), temperature source Oral, resp. rate 16, height 5\' 4"  (1.626 m), weight 71.2 kg, SpO2 100 %.   Disposition: Discharge disposition: 01-Home or Self Care       Discharge Instructions    Call MD for:  difficulty breathing, headache or visual disturbances   Complete by: As directed    Call MD for:  extreme fatigue   Complete by: As directed    Call MD for:  persistant dizziness or light-headedness   Complete by: As directed    Call MD for:  persistant nausea and vomiting   Complete by: As directed    Call MD for:  redness, tenderness, or signs of infection (pain, swelling, redness, odor or green/yellow discharge around incision site)   Complete by: As directed    Call MD for:  severe uncontrolled pain   Complete by: As directed    Call MD for:  temperature >100.4   Complete by: As directed    Increase activity slowly   Complete by: As directed      Allergies as of  01/28/2020      Reactions   Tetracycline Hcl Hives   Childhood allergy   Erythromycin Hives   Childhood allergy   Pertussis Vaccines Hives   Childhood allergy   Reglan [metoclopramide] Other (See Comments)   Muscle Spasms   Tetracyclines & Related Hives   Childhood allergy   Latex Itching, Rash      Medication List    TAKE these medications   Ajovy 225 MG/1.5ML Sosy Generic drug: Fremanezumab-vfrm INJECT 225 MG SUBCUTANEOUSLY EVERY 30 DAYS   cyclobenzaprine 10 MG tablet Commonly known as: FLEXERIL Take 10 mg by mouth 3 (three) times daily as needed for muscle spasms.   diphenhydrAMINE 25 mg capsule Commonly known as: BENADRYL Take 25 mg by mouth.   docusate sodium 100 MG capsule Commonly known as: Colace Take 1 capsule (100 mg total) by mouth 2 (two) times daily as needed for mild constipation (while taking narcotics).   lamoTRIgine 200 MG tablet Commonly known as: LAMICTAL Take 1 tablet (200 mg total) by mouth 2 (two) times daily.   ondansetron 4 MG disintegrating tablet Commonly known as: ZOFRAN-ODT TAKE 1 TABLET BY MOUTH EVERY 8 HOURS AS NEEDED FOR NAUSEA OR VOMITING What changed: See the new instructions.   oxyCODONE 5 MG immediate release tablet Commonly known as: Oxy IR/ROXICODONE Take 1 tablet (5 mg total) by mouth every 4 (four) hours as needed for severe pain or breakthrough pain.   SUMAtriptan 6 MG/0.5ML Soln injection Commonly known  as: IMITREX Inject 0.5 mLs (6 mg total) into the skin every 2 (two) hours as needed for migraine or headache. May repeat in 2 hours if headache persists or recurs.   topiramate 100 MG tablet Commonly known as: TOPAMAX Take 1 tablet (100 mg total) by mouth 2 (two) times daily.   Ubrelvy 100 MG Tabs Generic drug: Ubrogepant Take 1 tablet for migraine.  May repeat in 2 hours if needed.  Maximum 2 tablets in 24 hours.       Follow-up Information    Virl Cagey, MD On 02/07/2020.   Specialty: General Surgery Why:  post op phone to see how you are doing, if you need to be seen in person call the office for an in person appt Contact information: [address] [place]na [place] [address] [phone]               Signed: Virl Cagey 01/28/2020, 3:57 PM

## 2020-01-28 NOTE — Consult Note (Signed)
[place]  Reason for Consult: Acute appendicitis  Referring Physician: Domenic Moras, PA   Chief Complaint    Abdominal Pain      HPI: Bethany Little is a 33 y.o. female with right lower quadrant pain that started Saturday and was in the lower abdomen. She had associated nausea and vomiting and says that the pain is worse with a full bladder and hurts when she moves. She was seen in the Ed and found to have a mild leukocytosis and CT findings concerning for dilated appendix with stranding consistent with early appendicitis.  A pelvic exam was done in the ED that was low suspicion for PID and wet prep showed some clue cells which she has had. She has no pain or burning with urination just worse abdominal pain with a full bladder.  Pain medication is helping to improve her pain. She says she no long has HTN and is off meds. 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 She denies chest pain or SOB but says she does have palpitations with anxiety sometimes. She smokes marijuana.   Past Medical History:  Diagnosis Date  . Anxiety   . Arthritis   . Cervical abnormality   . Cervical cancer ([place]) 2003  . Depression   . Heartburn   . Hypertension   . Interstitial cystitis   . Leukemia ([place])    Remission for 23 yrs  . Seizures ([place])   . Sleep apnea     Past Surgical History:  Procedure Laterality Date  . CARDIAC CATHETERIZATION     4 weeks old  . CESAREAN SECTION    . LEEP    . TUBAL LIGATION      Family History  Problem Relation Age of Onset  . Depression Mother   . Bipolar disorder Mother   . COPD Mother   . Hypertension Mother   . Mental illness Mother   . Kidney disease Father        partial kidney fx  . COPD Father   . Pancreatic cancer Paternal Grandmother   . Celiac disease Paternal Grandmother   . Hypertension Paternal Grandmother   . Heart failure Paternal Grandfather    . Prostate cancer Other   . Healthy Son   . Bladder Cancer Neg Hx     Social History   Tobacco Use  . Smoking status: Current Some Day Smoker    Packs/day: 1.00    Years: 10.00    Pack years: 10.00    Types: Cigarettes  . Smokeless tobacco: Never Used  Vaping Use  . Vaping Use: Never used  Substance Use Topics  . Alcohol use: Not Currently  . Drug use: Yes    Types: Marijuana    Medications: I have reviewed the patient's current medications. Current Facility-Administered Medications  Medication Dose Route Frequency Provider Last Rate Last Admin  . acetaminophen (TYLENOL) tablet 650 mg  650 mg Oral Q6H PRN Opyd, Ilene Qua, MD       Or  . acetaminophen (TYLENOL) suppository 650 mg  650 mg Rectal Q6H PRN Opyd, Ilene Qua, MD      . cefoTEtan (CEFOTAN) 2 g in sodium chloride 0.9 % 100 mL IVPB  2 g Intravenous On Call to OR Virl Cagey, MD      . cefTRIAXone (ROCEPHIN) 2 g in sodium chloride 0.9 % 100 mL IVPB  2 g Intravenous Q24H  Vianne Bulls, MD      . Chlorhexidine Gluconate Cloth 2 % PADS 6 each  6 each Topical Once Virl Cagey, MD       And  . Chlorhexidine Gluconate Cloth 2 % PADS 6 each  6 each Topical Once Virl Cagey, MD      . dextrose 5 %-0.45 % sodium chloride infusion   Intravenous Continuous Opyd, Ilene Qua, MD   Stopped at 01/28/20 6270  . lamoTRIgine (LAMICTAL) tablet 200 mg  200 mg Oral BID Opyd, Ilene Qua, MD   200 mg at 01/27/20 2259  . metroNIDAZOLE (FLAGYL) IVPB 500 mg  500 mg Intravenous Q8H Opyd, Ilene Qua, MD 100 mL/hr at 01/28/20 0805 500 mg at 01/28/20 0805  . morphine 2 MG/ML injection 2-4 mg  2-4 mg Intravenous Q4H PRN Opyd, Ilene Qua, MD   2 mg at 01/28/20 0757  . ondansetron (ZOFRAN) tablet 4 mg  4 mg Oral Q6H PRN Opyd, Ilene Qua, MD       Or  . ondansetron (ZOFRAN) injection 4 mg  4 mg Intravenous Q6H PRN Opyd, Ilene Qua, MD   4 mg at 01/28/20 3500   Current Outpatient Medications  Medication Sig Dispense Refill Last Dose   . cyclobenzaprine (FLEXERIL) 10 MG tablet Take 10 mg by mouth 3 (three) times daily as needed for muscle spasms.   Past Month at Unknown time  . diphenhydrAMINE (BENADRYL) 25 mg capsule Take 25 mg by mouth.   Past Week at Unknown time  . lamoTRIgine (LAMICTAL) 200 MG tablet Take 1 tablet (200 mg total) by mouth 2 (two) times daily. 60 tablet 11 01/26/2020 at Unknown time  . ondansetron (ZOFRAN-ODT) 4 MG disintegrating tablet TAKE 1 TABLET BY MOUTH EVERY 8 HOURS AS NEEDED FOR NAUSEA OR VOMITING (Patient taking differently: Take 4 mg by mouth every 8 (eight) hours as needed for nausea or vomiting. ) 30 tablet 5 01/26/2020 at Unknown time  . SUMAtriptan (IMITREX) 6 MG/0.5ML SOLN injection Inject 0.5 mLs (6 mg total) into the skin every 2 (two) hours as needed for migraine or headache. May repeat in 2 hours if headache persists or recurs. 9 vial 3 Past Month at Unknown time  . topiramate (TOPAMAX) 100 MG tablet Take 1 tablet (100 mg total) by mouth 2 (two) times daily. 180 tablet 0 Past Week at Unknown time  . AJOVY 225 MG/1.5ML SOSY INJECT 225 MG SUBCUTANEOUSLY EVERY 30 DAYS (Patient not taking: Reported on 01/25/2020) 1.5 mL 3 12/31/2019  . Ubrogepant (UBRELVY) 100 MG TABS Take 1 tablet for migraine.  May repeat in 2 hours if needed.  Maximum 2 tablets in 24 hours. 10 tablet 5 PRN    Allergies  Allergen Reactions  . Tetracycline Hcl Hives    Childhood allergy  . Erythromycin Hives    Childhood allergy  . Pertussis Vaccines Hives    Childhood allergy  . Reglan [Metoclopramide] Other (See Comments)    Muscle Spasms  . Tetracyclines & Related Hives    Childhood allergy  . Latex Itching and Rash     ROS:  A comprehensive review of systems was negative except for: Gastrointestinal: positive for abdominal pain, nausea and vomiting  Blood pressure 100/64, pulse 68, temperature 98.4 F (36.9 C), temperature source Oral, resp. rate 18, height 5\' 4"  (1.626 m), weight 71.2 kg, SpO2 (!) 84 %. Physical  Exam Vitals reviewed.  Constitutional:      Appearance: She is well-developed.  HENT:  Head: Normocephalic.  Eyes:     Extraocular Movements: Extraocular movements intact.  Cardiovascular:     Rate and Rhythm: Normal rate and regular rhythm.  Pulmonary:     Effort: Pulmonary effort is normal.  Abdominal:     General: There is no distension.     Palpations: Abdomen is soft.     Tenderness: There is abdominal tenderness in the right lower quadrant and suprapubic area.  Musculoskeletal:     Comments: Moves all extremities   Skin:    General: Skin is warm and dry.  Neurological:     General: No focal deficit present.     Mental Status: She is alert and oriented to person, place, and time.  Psychiatric:        Mood and Affect: Mood normal.        Behavior: Behavior normal.     Results: Results for orders placed or performed during the hospital encounter of 01/27/20 (from the past 48 hour(s))  Lipase, blood     Status: None   Collection Time: 01/27/20  7:46 PM  Result Value Ref Range   Lipase 32 11 - 51 U/L    Comment: Performed at [facility], [address], [place], [place] [address]  Comprehensive metabolic panel     Status: None   Collection Time: 01/27/20  7:46 PM  Result Value Ref Range   Sodium 140 135 - 145 mmol/L   Potassium 4.0 3.5 - 5.1 mmol/L   Chloride 105 98 - 111 mmol/L   CO2 24 22 - 32 mmol/L   Glucose, Bld 87 70 - 99 mg/dL    Comment: Glucose reference range applies only to samples taken after fasting for at least 8 hours.   BUN 11 6 - 20 mg/dL   Creatinine, Ser 0.89 0.44 - 1.00 mg/dL   Calcium 9.8 8.9 - 10.3 mg/dL   Total Protein 7.9 6.5 - 8.1 g/dL   Albumin 4.5 3.5 - 5.0 g/dL   AST 20 15 - 41 U/L   ALT 23 0 - 44 U/L   Alkaline Phosphatase 57 38 - 126 U/L   Total Bilirubin 0.3 0.3 - 1.2 mg/dL   GFR calc non Af Amer >60 >60 mL/min   GFR calc Af Amer >60 >60 mL/min   Anion gap 11 5 - 15    Comment: Performed at [facility], [address]  [place], [place] [address]  CBC     Status: Abnormal   Collection Time: 01/27/20  7:46 PM  Result Value Ref Range   WBC 11.3 (H) 4.0 - 10.5 K/uL   RBC 4.70 3.87 - 5.11 MIL/uL   Hemoglobin 15.0 12.0 - 15.0 g/dL   HCT 47.0 (H) 36 - 46 %   MCV 100.0 80.0 - 100.0 fL   MCH 31.9 26.0 - 34.0 pg   MCHC 31.9 30.0 - 36.0 g/dL   RDW 13.8 11.5 - 15.5 %   Platelets 264 150 - 400 K/uL   nRBC 0.0 0.0 - 0.2 %    Comment: Performed at [facility], [address], [place], [place] [address]  Urinalysis, Routine w reflex microscopic Urine, Clean Catch     Status: Abnormal   Collection Time: 01/27/20  7:50 PM  Result Value Ref Range   Color, Urine YELLOW YELLOW   APPearance CLOUDY (A) CLEAR   Specific Gravity, Urine 1.013 1.005 - 1.030   pH 7.0 5.0 - 8.0   Glucose, UA NEGATIVE NEGATIVE mg/dL   Hgb  urine dipstick NEGATIVE NEGATIVE   Bilirubin Urine NEGATIVE NEGATIVE   Ketones, ur NEGATIVE NEGATIVE mg/dL   Protein, ur NEGATIVE NEGATIVE mg/dL   Nitrite NEGATIVE NEGATIVE   Leukocytes,Ua LARGE (A) NEGATIVE   RBC / HPF 0-5 0 - 5 RBC/hpf   WBC, UA 6-10 0 - 5 WBC/hpf   Bacteria, UA RARE (A) NONE SEEN   Squamous Epithelial / LPF 11-20 0 - 5   Mucus PRESENT     Comment: Performed at [facility], [address], [place], [place] [address]  Wet prep, genital     Status: Abnormal   Collection Time: 01/27/20  8:21 PM   Specimen: PATH Cytology Cervicovaginal Ancillary Only  Result Value Ref Range   Yeast Wet Prep HPF POC PRESENT (A) NONE SEEN   Trich, Wet Prep NONE SEEN NONE SEEN   Clue Cells Wet Prep HPF POC PRESENT (A) NONE SEEN   WBC, Wet Prep HPF POC MODERATE (A) NONE SEEN   Sperm PRESENT     Comment: Performed at [facility], [address], [place], [place] [address]  SARS Coronavirus 2 by RT PCR (hospital order, performed in [place] hospital lab) Nasopharyngeal Nasopharyngeal Swab     Status: None   Collection Time: 01/27/20 10:04 PM   Specimen: Nasopharyngeal Swab  Result Value Ref Range   SARS  Coronavirus 2 NEGATIVE NEGATIVE    Comment: (NOTE) SARS-CoV-2 target nucleic acids are NOT DETECTED.  The SARS-CoV-2 RNA is generally detectable in upper and lower respiratory specimens during the acute phase of infection. The lowest concentration of SARS-CoV-2 viral copies this assay can detect is 250 copies / mL. A negative result does not preclude SARS-CoV-2 infection and should not be used as the sole basis for treatment or other patient management decisions.  A negative result may occur with improper specimen collection / handling, submission of specimen other than nasopharyngeal swab, presence of viral mutation(s) within the areas targeted by this assay, and inadequate number of viral copies (<250 copies / mL). A negative result must be combined with clinical observations, patient history, and epidemiological information.  Fact Sheet for Patients:   StrictlyIdeas.no  Fact Sheet for Healthcare Providers: BankingDealers.co.za  This test is not yet approved or  cleared by the [place] FDA and has been authorized for detection and/or diagnosis of SARS-CoV-2 by FDA under an Emergency Use Authorization (EUA).  This EUA will remain in effect (meaning this test can be used) for the duration of the COVID-19 declaration under Section 564(b)(1) of the Act, 21 U.S.C. section 360bbb-3(b)(1), unless the authorization is terminated or revoked sooner.  Performed at [facility], [address], [place], [place] [address]   Basic metabolic panel     Status: Abnormal   Collection Time: 01/28/20  6:58 AM  Result Value Ref Range   Sodium 138 135 - 145 mmol/L   Potassium 3.5 3.5 - 5.1 mmol/L   Chloride 108 98 - 111 mmol/L   CO2 23 22 - 32 mmol/L   Glucose, Bld 94 70 - 99 mg/dL    Comment: Glucose reference range applies only to samples taken after fasting for at least 8 hours.   BUN 7 6 - 20 mg/dL   Creatinine, Ser 0.74 0.44 - 1.00 mg/dL    Calcium 8.1 (L) 8.9 - 10.3 mg/dL   GFR calc non Af Amer >60 >60 mL/min   GFR calc Af Amer >60 >60 mL/min   Anion gap 7 5 - 15    Comment:  Performed at [facility], [address] [place], [place] [address]  Hepatic function panel     Status: Abnormal   Collection Time: 01/28/20  6:58 AM  Result Value Ref Range   Total Protein 6.0 (L) 6.5 - 8.1 g/dL   Albumin 3.5 3.5 - 5.0 g/dL   AST 14 (L) 15 - 41 U/L   ALT 17 0 - 44 U/L   Alkaline Phosphatase 41 38 - 126 U/L   Total Bilirubin 0.3 0.3 - 1.2 mg/dL   Bilirubin, Direct <0.1 0.0 - 0.2 mg/dL   Indirect Bilirubin NOT CALCULATED 0.3 - 0.9 mg/dL    Comment: Performed at [facility], [address], [place], [place] [address]  Magnesium     Status: None   Collection Time: 01/28/20  6:58 AM  Result Value Ref Range   Magnesium 1.9 1.7 - 2.4 mg/dL    Comment: Performed at [facility], [address] [place], [place] [address]  CBC     Status: Abnormal   Collection Time: 01/28/20  6:58 AM  Result Value Ref Range   WBC 9.4 4.0 - 10.5 K/uL   RBC 3.87 3.87 - 5.11 MIL/uL   Hemoglobin 12.4 12.0 - 15.0 g/dL   HCT 38.9 36 - 46 %   MCV 100.5 (H) 80.0 - 100.0 fL   MCH 32.0 26.0 - 34.0 pg   MCHC 31.9 30.0 - 36.0 g/dL   RDW 13.8 11.5 - 15.5 %   Platelets 213 150 - 400 K/uL   nRBC 0.0 0.0 - 0.2 %    Comment: Performed at [facility], [address] [place], [place] [address]   Personally reviewed CT- dilated appendix with stranding, more dilated than prior and walls hyperemic, no fluid collection CT ABDOMEN PELVIS W CONTRAST  Result Date: 01/27/2020 CLINICAL DATA:  Right lower quadrant abdominal pain EXAM: CT ABDOMEN AND PELVIS WITH CONTRAST TECHNIQUE: Multidetector CT imaging of the abdomen and pelvis was performed using the standard protocol following bolus administration of intravenous contrast. CONTRAST:  18mL OMNIPAQUE IOHEXOL 300 MG/ML  SOLN COMPARISON:  12/26/2015 FINDINGS: Lower chest: No acute abnormality. Hepatobiliary: No focal liver  abnormality is seen. No gallstones, gallbladder wall thickening, or biliary dilatation. Pancreas: Unremarkable Spleen: Unremarkable Adrenals/Urinary Tract: Adrenal glands are unremarkable. Kidneys are normal, without renal calculi, focal lesion, or hydronephrosis. Bladder is unremarkable. Stomach/Bowel: The stomach, small bowel, and large bowel are unremarkable. The appendix is hyperemic and, while not overtly dilated, is thickened when compared to prior examination and is suspicious for changes of early appendicitis in the setting of right lower quadrant pain. No evidence of obstruction. Mild free fluid within the pelvis may be physiologic in a female patient of this age. No free intraperitoneal gas. Vascular/Lymphatic: No significant vascular findings are present. No enlarged abdominal or pelvic lymph nodes. Reproductive: Bilateral corpus lutea are noted. The pelvic organs are otherwise normal. Other: Rectum unremarkable Musculoskeletal: No acute bone abnormality IMPRESSION: The appendix is hyperemic and, while not overtly dilated, is thickened when compared to prior examination and is suspicious for changes of early appendicitis in the setting of right lower quadrant pain. Appendix: Location: Inferior to cecum Diameter: 8 mm Appendicolith: No Mucosal hyper-enhancement: Yes Extraluminal gas: No Periappendiceal collection: No Electronically Signed   By: Fidela Salisbury MD   On: 01/27/2020 21:20     Assessment & Plan:  MICIAH SHEALY is a 34 y.o. female with CT findings concerning for early appendicitis in the setting of RLQ pain nausea/vomiting. Discussed  the risk of laparoscopic appendectomy and the option of antibiotics alone. Discussed that in [place] and some trials in the [place], antibiotics are used for simple appendicitis. Discussed that research shows a 40% failure rate for antibiotics alone.  Discussed risk of surgery including but not limited to bleeding, infection, injury to other organs, normal  appendix, and after this discussion the patient has decided to proceed with surgery.  -Discussed if she feels ok she may go home after surgery. -COVID negative -EKG ordered given prior EKG with ? Of T wave abnormalities in 2018, repeat with Sinus rhythm today All questions were answered to the satisfaction of the patient.     Virl Cagey 01/28/2020, 9:34 AM

## 2020-01-28 NOTE — Plan of Care (Signed)
  Problem: Education: Goal: Knowledge of General Education information will improve Description: Including pain rating scale, medication(s)/side effects and non-pharmacologic comfort measures 01/28/2020 1800 by Santa Lighter, RN Outcome: Adequate for Discharge 01/28/2020 1451 by Santa Lighter, RN Outcome: Progressing   Problem: Health Behavior/Discharge Planning: Goal: Ability to manage health-related needs will improve 01/28/2020 1800 by Santa Lighter, RN Outcome: Adequate for Discharge 01/28/2020 1451 by Santa Lighter, RN Outcome: Progressing   Problem: Clinical Measurements: Goal: Ability to maintain clinical measurements within normal limits will improve 01/28/2020 1800 by Santa Lighter, RN Outcome: Adequate for Discharge 01/28/2020 1451 by Santa Lighter, RN Outcome: Progressing Goal: Will remain free from infection 01/28/2020 1800 by Santa Lighter, RN Outcome: Adequate for Discharge 01/28/2020 1451 by Santa Lighter, RN Outcome: Progressing Goal: Diagnostic test results will improve Outcome: Adequate for Discharge Goal: Respiratory complications will improve Outcome: Adequate for Discharge Goal: Cardiovascular complication will be avoided Outcome: Adequate for Discharge   Problem: Activity: Goal: Risk for activity intolerance will decrease Outcome: Adequate for Discharge   Problem: Nutrition: Goal: Adequate nutrition will be maintained Outcome: Adequate for Discharge   Problem: Coping: Goal: Level of anxiety will decrease Outcome: Adequate for Discharge   Problem: Elimination: Goal: Will not experience complications related to bowel motility Outcome: Adequate for Discharge Goal: Will not experience complications related to urinary retention Outcome: Adequate for Discharge   Problem: Pain Managment: Goal: General experience of comfort will improve Outcome: Adequate for Discharge   Problem: Safety: Goal: Ability to remain free from injury will improve Outcome:  Adequate for Discharge   Problem: Skin Integrity: Goal: Risk for impaired skin integrity will decrease Outcome: Adequate for Discharge

## 2020-01-28 NOTE — Plan of Care (Signed)

## 2020-01-29 ENCOUNTER — Telehealth: Payer: Self-pay

## 2020-01-29 LAB — GC/CHLAMYDIA PROBE AMP (~~LOC~~) NOT AT ARMC
Chlamydia: NEGATIVE
Comment: NEGATIVE
Comment: NORMAL
Neisseria Gonorrhea: NEGATIVE

## 2020-01-29 NOTE — Telephone Encounter (Signed)
Pt called and informed that thyroid and B12 levels are normal  

## 2020-01-29 NOTE — Telephone Encounter (Signed)
-----   Message from Cameron Sprang, MD sent at 01/29/2020 10:52 AM EDT ----- Pls let her know thyroid and B12 levels are normal, thanks

## 2020-01-30 ENCOUNTER — Encounter (HOSPITAL_COMMUNITY): Payer: Self-pay | Admitting: General Surgery

## 2020-01-31 LAB — SURGICAL PATHOLOGY

## 2020-02-01 ENCOUNTER — Other Ambulatory Visit: Payer: Self-pay | Admitting: Neurology

## 2020-02-01 ENCOUNTER — Telehealth (INDEPENDENT_AMBULATORY_CARE_PROVIDER_SITE_OTHER): Payer: Medicaid Other | Admitting: General Surgery

## 2020-02-01 DIAGNOSIS — Z09 Encounter for follow-up examination after completed treatment for conditions other than malignant neoplasm: Secondary | ICD-10-CM

## 2020-02-01 MED ORDER — OXYCODONE HCL 5 MG PO TABS
5.0000 mg | ORAL_TABLET | ORAL | 0 refills | Status: DC | PRN
Start: 2020-02-01 — End: 2020-02-11

## 2020-02-01 MED ORDER — EMGALITY 120 MG/ML ~~LOC~~ SOAJ: 120.0000 mg | SUBCUTANEOUS | 5 refills | Status: DC

## 2020-02-01 NOTE — Telephone Encounter (Signed)
[facility]   Pathology without appendicitis. GC and Chlamydia negative.  FINAL MICROSCOPIC DIAGNOSIS:   A. APPENDIX, APPENDECTOMY:  - Benign appendix.  - No significant acute inflammation.   Says the glue came off and incision is opening up on the umbilicus and the left side. No drainage. Has bruising. Her pain is in the umbilical area shooting in that area and to the right of the belly button.  She is constipated and she is taking some laxative and drank pickle juice. She normally goes once a day but has not gone since surgery. Encouraged more laxative.   She is eating normally and has been having some nausea. She takes zofran prior to surgery.  She sees her PCP today.   Refilled roxicodone for weekend. Continue ibuprofen.   Will check on wounds Tuesday 10:30Am. Gave her the number for [facility] in case needed something over weekend or wounds started to look infected/ red or draining pus.    Bethany Labrum, MD [facility] [address] [place], [place] [address] [phone] (office)

## 2020-02-05 ENCOUNTER — Ambulatory Visit (INDEPENDENT_AMBULATORY_CARE_PROVIDER_SITE_OTHER): Payer: Medicaid Other | Admitting: General Surgery

## 2020-02-05 ENCOUNTER — Other Ambulatory Visit: Payer: Self-pay

## 2020-02-05 ENCOUNTER — Encounter: Payer: Self-pay | Admitting: General Surgery

## 2020-02-05 VITALS — BP 118/74 | HR 85 | Temp 98.8°F | Resp 14 | Ht 64.0 in | Wt 161.0 lb

## 2020-02-05 DIAGNOSIS — B9689 Other specified bacterial agents as the cause of diseases classified elsewhere: Secondary | ICD-10-CM

## 2020-02-05 DIAGNOSIS — K5900 Constipation, unspecified: Secondary | ICD-10-CM

## 2020-02-05 DIAGNOSIS — N760 Acute vaginitis: Secondary | ICD-10-CM

## 2020-02-05 DIAGNOSIS — R1030 Lower abdominal pain, unspecified: Secondary | ICD-10-CM | POA: Insufficient documentation

## 2020-02-05 MED ORDER — METRONIDAZOLE 500 MG PO TABS: 500.0000 mg | ORAL_TABLET | Freq: Two times a day (BID) | ORAL | 0 refills | Status: AC

## 2020-02-05 MED ORDER — DOCUSATE SODIUM 100 MG PO CAPS: 100.0000 mg | ORAL_CAPSULE | Freq: Two times a day (BID) | ORAL | 0 refills | Status: DC

## 2020-02-05 NOTE — Patient Instructions (Signed)
Bacterial Vaginosis  Bacterial vaginosis is a vaginal infection that occurs when the normal balance of bacteria in the vagina is disrupted. It results from an overgrowth of certain bacteria. This is the most common vaginal infection among women ages 15-44. Because bacterial vaginosis increases your risk for STIs (sexually transmitted infections), getting treated can help reduce your risk for chlamydia, gonorrhea, herpes, and HIV (human immunodeficiency virus). Treatment is also important for preventing complications in pregnant women, because this condition can cause an early (premature) delivery. What are the causes? This condition is caused by an increase in harmful bacteria that are normally present in small amounts in the vagina. However, the reason that the condition develops is not fully understood. What increases the risk? The following factors may make you more likely to develop this condition:  Having a new sexual partner or multiple sexual partners.  Having unprotected sex.  Douching.  Having an intrauterine device (IUD).  Smoking.  Drug and alcohol abuse.  Taking certain antibiotic medicines.  Being pregnant. You cannot get bacterial vaginosis from toilet seats, bedding, swimming pools, or contact with objects around you. What are the signs or symptoms? Symptoms of this condition include:  Grey or white vaginal discharge. The discharge can also be watery or foamy.  A fish-like odor with discharge, especially after sexual intercourse or during menstruation.  Itching in and around the vagina.  Burning or pain with urination. Some women with bacterial vaginosis have no signs or symptoms. How is this diagnosed? This condition is diagnosed based on:  Your medical history.  A physical exam of the vagina.  Testing a sample of vaginal fluid under a microscope to look for a large amount of bad bacteria or abnormal cells. Your health care provider may use a cotton swab or  a small wooden spatula to collect the sample. How is this treated? This condition is treated with antibiotics. These may be given as a pill, a vaginal cream, or a medicine that is put into the vagina (suppository). If the condition comes back after treatment, a second round of antibiotics may be needed. Follow these instructions at home: Medicines  Take over-the-counter and prescription medicines only as told by your health care provider.  Take or use your antibiotic as told by your health care provider. Do not stop taking or using the antibiotic even if you start to feel better. General instructions  If you have a female sexual partner, tell her that you have a vaginal infection. She should see her health care provider and be treated if she has symptoms. If you have a female sexual partner, he does not need treatment.  During treatment: ? Avoid sexual activity until you finish treatment. ? Do not douche. ? Avoid alcohol as directed by your health care provider. ? Avoid breastfeeding as directed by your health care provider.  Drink enough water and fluids to keep your urine clear or pale yellow.  Keep the area around your vagina and rectum clean. ? Wash the area daily with warm water. ? Wipe yourself from front to back after using the toilet.  Keep all follow-up visits as told by your health care provider. This is important. How is this prevented?  Do not douche.  Wash the outside of your vagina with warm water only.  Use protection when having sex. This includes latex condoms and dental dams.  Limit how many sexual partners you have. To help prevent bacterial vaginosis, it is best to have sex with just one partner (  monogamous).  Make sure you and your sexual partner are tested for STIs.  Wear cotton or cotton-lined underwear.  Avoid wearing tight pants and pantyhose, especially during summer.  Limit the amount of alcohol that you drink.  Do not use any products that contain  nicotine or tobacco, such as cigarettes and e-cigarettes. If you need help quitting, ask your health care provider.  Do not use illegal drugs. Where to find more information  Centers for Disease Control and Prevention: www.cdc.gov/std  American Sexual Health Association (ASHA): www.ashastd.org  U.S. Department of Health and Human Services, Office on Women's Health: www.womenshealth.gov/ or https://www.womenshealth.gov/a-z-topics/bacterial-vaginosis Contact a health care provider if:  Your symptoms do not improve, even after treatment.  You have more discharge or pain when urinating.  You have a fever.  You have pain in your abdomen.  You have pain during sex.  You have vaginal bleeding between periods. Summary  Bacterial vaginosis is a vaginal infection that occurs when the normal balance of bacteria in the vagina is disrupted.  Because bacterial vaginosis increases your risk for STIs (sexually transmitted infections), getting treated can help reduce your risk for chlamydia, gonorrhea, herpes, and HIV (human immunodeficiency virus). Treatment is also important for preventing complications in pregnant women, because the condition can cause an early (premature) delivery.  This condition is treated with antibiotic medicines. These may be given as a pill, a vaginal cream, or a medicine that is put into the vagina (suppository). This information is not intended to replace advice given to you by your health care provider. Make sure you discuss any questions you have with your health care provider. Document Revised: 04/22/2017 Document Reviewed: 01/24/2016 Elsevier Patient Education  2020 Elsevier Inc.  

## 2020-02-05 NOTE — Progress Notes (Signed)
[facility] Note   HPI:  34 y.o. Female presents to clinic for post-op follow-up evaluation of after lap appy that was negative for appendicitis. During her laparoscopy her urterus was a little hyperemic but GYN, Dr. Gloriann Loan who I curbside that day did not think this was specific for anything. Her GC and Chlamydia were negative but BV was positive. She did not get BV treatment from the ED. She now is saying she has a fishy odor and discharge. She still having some lower abdominal pain. She has a history of Cervical cancer s/p Leep it sounds like. She is not followed by Gyn.  Review of Systems:  Lower abdominal pain No urinary pain or frequency Odor and vaginal discharge  All other review of systems: otherwise negative   Vital Signs:  BP 118/74   Pulse 85   Temp 98.8 F (37.1 C) (Oral)   Resp 14   Ht 5\' 4"  (1.626 m)   Wt 161 lb (73 kg)   SpO2 97%   BMI 27.64 kg/m    Physical Exam:  Physical Exam Vitals reviewed.  Cardiovascular:     Rate and Rhythm: Normal rate.  Pulmonary:     Effort: Pulmonary effort is normal.  Abdominal:     General: There is no distension.     Palpations: Abdomen is soft.     Tenderness: There is abdominal tenderness.     Comments: Port sites healing, dermabond off, no erythema or drainage, bruising resolving under umbilicus, no signs of infection, tender along lower abdomen with palpation      Assessment:  34 y.o. yo Female with BV on her wet prep and some odor and discharge. I do not know if this could be contributing to her pain. Her port sites look fine. She is still constipated too.  Plan:  Flagyl for BV for 7 days GYN referral to get established given history  Colace for constipation. Benefiber/ metamucil   All of the above recommendations were discussed with the patient and patient's family, and all of patient's and family's questions were answered to their expressed satisfaction.  Curlene Labrum, MD [facility] [address] [place], [place] [address] [phone] (office)

## 2020-02-06 ENCOUNTER — Other Ambulatory Visit: Payer: Self-pay | Admitting: Family Medicine

## 2020-02-06 DIAGNOSIS — N760 Acute vaginitis: Secondary | ICD-10-CM

## 2020-02-07 ENCOUNTER — Telehealth: Payer: Self-pay | Admitting: General Surgery

## 2020-02-11 ENCOUNTER — Encounter: Payer: Self-pay | Admitting: Adult Health

## 2020-02-11 ENCOUNTER — Ambulatory Visit (INDEPENDENT_AMBULATORY_CARE_PROVIDER_SITE_OTHER): Payer: Medicaid Other | Admitting: Adult Health

## 2020-02-11 VITALS — BP 125/88 | HR 83 | Ht 64.0 in | Wt 162.0 lb

## 2020-02-11 DIAGNOSIS — R319 Hematuria, unspecified: Secondary | ICD-10-CM

## 2020-02-11 DIAGNOSIS — R10A1 Flank pain, right side: Secondary | ICD-10-CM | POA: Insufficient documentation

## 2020-02-11 DIAGNOSIS — R1031 Right lower quadrant pain: Secondary | ICD-10-CM | POA: Diagnosis not present

## 2020-02-11 DIAGNOSIS — R109 Unspecified abdominal pain: Secondary | ICD-10-CM | POA: Diagnosis not present

## 2020-02-11 DIAGNOSIS — R10811 Right upper quadrant abdominal tenderness: Secondary | ICD-10-CM | POA: Diagnosis not present

## 2020-02-11 LAB — POCT URINALYSIS DIPSTICK
Glucose, UA: NEGATIVE
Ketones, UA: NEGATIVE
Leukocytes, UA: NEGATIVE
Nitrite, UA: NEGATIVE
Protein, UA: NEGATIVE

## 2020-02-11 MED ORDER — PROMETHAZINE HCL 25 MG PO TABS: 25.0000 mg | ORAL_TABLET | Freq: Four times a day (QID) | ORAL | 1 refills | Status: DC | PRN

## 2020-02-11 MED ORDER — IBUPROFEN 800 MG PO TABS: 800.0000 mg | ORAL_TABLET | Freq: Three times a day (TID) | ORAL | 1 refills | Status: DC | PRN

## 2020-02-11 NOTE — Progress Notes (Signed)
Patient ID: Bethany Little, female   DOB: Nov 04, 1985, 34 y.o.   MRN: [id number] History of Present Illness: [name] is a 34 year old white female, divorced has SO, G2P1011 referred by Dr Constance Haw, for pain in right side and back.She was seen in ER on 01/27/20 for pain that started 2-3 days before that,and had CT that showed hyperemic appendix,otherwise normal,she  then had appendectomy on 01/28/20 by Dr Constance Haw, appendix was benign she says and still has pain.She was treated for BV.  She has nausea and vomiting at times, she is getting EGD next week. Has history of LEEP and not sure when had last pap. PCP is the [facility].  Current Medications, Allergies, Past Medical History, Past Surgical History, Family History and Social History were reviewed in [employer] record.     Review of Systems: Pain in right side and back +nausea and vomiting +pain with sex After exam she said saw blood last night with BM   Physical Exam:BP 125/88 (BP Location: Right Arm, Cuff Size: Normal)   Pulse 83   Ht 5\' 4"  (1.626 m)   Wt 162 lb (73.5 kg)   LMP 02/05/2020   BMI 27.81 kg/m urine is 1+blood General:  Well developed, well nourished, no acute distress,but is uncomfortable. Skin:  Warm and dry Abdomen:  Soft, no hepatosplenomegaly,has tenderness RUQ and +CVAT on the right but seems to have on left too and across back  Pelvic:  External genitalia is normal in appearance, no lesions.  The vagina is normal in appearance. Urethra has no lesions or masses. The cervix is bulbous.  Uterus is felt to be normal size, shape, and contour, and tender.  No adnexal masses, +tenderness noted.Bladder is tender, no masses felt. Extremities/musculoskeletal:  No swelling or varicosities noted, no clubbing or cyanosis Psych:  No mood changes, alert and cooperative,seems happy AA is 1 Fall risk is low PHQ 9 score is 9, no SI  Upstream - 02/11/20 1040      Pregnancy Intention Screening   Does the  patient want to become pregnant in the next year? No    Does the patient's partner want to become pregnant in the next year? No    Would the patient like to discuss contraceptive options today? No      Contraception Wrap Up   Current Method Female Sterilization    End Method Female Sterilization    Contraception Counseling Provided No         Examination chaperoned by Dwyane Dee LPN   Impression and Plan: 1. Hematuria, unspecified type UA C&S sent   2. Right flank pain Will get renal US, she has never had kidney stone and CT did not show one Scheduled for 9/30 at 9:30 am at [facility] Will rx motrin and phenergan and push fluids  Can alternate heating pad and ice Meds ordered this encounter  Medications  . promethazine (PHENERGAN) 25 MG tablet    Sig: Take 1 tablet (25 mg total) by mouth every 6 (six) hours as needed for nausea or vomiting.    Dispense:  30 tablet    Refill:  1    Order Specific Question:   Supervising Provider    Answer:   Elonda Husky, LUTHER H [[id number]]  . ibuprofen (ADVIL) 800 MG tablet    Sig: Take 1 tablet (800 mg total) by mouth every 8 (eight) hours as needed.    Dispense:  60 tablet    Refill:  1    Order  Specific Question:   Supervising Provider    Answer:   Florian Buff [[id number]]   If pain increases go to ER  3. RLQ abdominal pain Will get abdominal and pelvis  US 9/30 at 9:30 at [facility]  4. Right upper quadrant abdominal tenderness without rebound tenderness Will get [place] Angie to check on precert for [place]  Follow up to see me 10/5 for pap and physical and ROS

## 2020-02-12 LAB — URINALYSIS, ROUTINE W REFLEX MICROSCOPIC
Bilirubin, UA: NEGATIVE
Glucose, UA: NEGATIVE
Ketones, UA: NEGATIVE
Leukocytes,UA: NEGATIVE
Nitrite, UA: NEGATIVE
RBC, UA: NEGATIVE
Specific Gravity, UA: 1.021 (ref 1.005–1.030)
Urobilinogen, Ur: 0.2 mg/dL (ref 0.2–1.0)
pH, UA: 5.5 (ref 5.0–7.5)

## 2020-02-13 LAB — URINE CULTURE: Organism ID, Bacteria: NO GROWTH

## 2020-02-18 ENCOUNTER — Other Ambulatory Visit: Payer: Self-pay

## 2020-02-18 ENCOUNTER — Ambulatory Visit (INDEPENDENT_AMBULATORY_CARE_PROVIDER_SITE_OTHER): Payer: Medicaid Other | Admitting: Neurology

## 2020-02-18 DIAGNOSIS — R413 Other amnesia: Secondary | ICD-10-CM

## 2020-02-18 DIAGNOSIS — R404 Transient alteration of awareness: Secondary | ICD-10-CM | POA: Diagnosis not present

## 2020-02-18 DIAGNOSIS — F445 Conversion disorder with seizures or convulsions: Secondary | ICD-10-CM

## 2020-02-18 NOTE — Progress Notes (Signed)
Bethany Little Key: [id number] - PA Case ID: [id number] - Rx #: [id number] Need help? Call us at [phone] Outcome Approvedtoday Approved for EMGALITY (Galcanezumab-gnlm Subcutaneous Solution Auto-Injector 120 MG/ML), quantity up to 1 mL per 28 days, under the pharmacy benefit. Generic substitution required when available. The member must fill their prescription with [facility]. Please fax the prescription to [facility] at [phone]. You may also provide the prescription to [facility] by calling [phone]. You can call [facility] at [phone] for dispensing arrangements or if you have questions. Drug Emgality 120MG /ML auto-injectors (migraine) Form [employer] Medicaid Electronic Prior Authorization Form ([employer]) (CB) 2017 NCPDP Original Claim Info [phone]

## 2020-02-21 ENCOUNTER — Telehealth: Payer: Self-pay | Admitting: *Deleted

## 2020-02-21 ENCOUNTER — Ambulatory Visit (HOSPITAL_COMMUNITY)
Admission: RE | Admit: 2020-02-21 | Discharge: 2020-02-21 | Disposition: A | Discharge status: Home / Self Care | Payer: Medicaid Other | Source: Ambulatory Visit | Attending: Adult Health | Admitting: Adult Health

## 2020-02-21 ENCOUNTER — Other Ambulatory Visit: Payer: Self-pay

## 2020-02-21 DIAGNOSIS — R109 Unspecified abdominal pain: Secondary | ICD-10-CM | POA: Insufficient documentation

## 2020-02-21 DIAGNOSIS — R10811 Right upper quadrant abdominal tenderness: Secondary | ICD-10-CM

## 2020-02-21 DIAGNOSIS — R1031 Right lower quadrant pain: Secondary | ICD-10-CM | POA: Diagnosis present

## 2020-02-21 NOTE — Telephone Encounter (Signed)
Telephoned patient at home number and advised patient [place] and Renal US were normal. Sent link for mychart sign up. Patient voiced understanding.

## 2020-02-26 ENCOUNTER — Other Ambulatory Visit (HOSPITAL_COMMUNITY)
Admission: RE | Admit: 2020-02-26 | Discharge: 2020-02-26 | Disposition: A | Discharge status: Home / Self Care | Payer: Medicaid Other | Source: Ambulatory Visit | Attending: Adult Health | Admitting: Adult Health

## 2020-02-26 ENCOUNTER — Encounter: Payer: Self-pay | Admitting: Adult Health

## 2020-02-26 ENCOUNTER — Ambulatory Visit (INDEPENDENT_AMBULATORY_CARE_PROVIDER_SITE_OTHER): Payer: Medicaid Other | Admitting: Adult Health

## 2020-02-26 VITALS — BP 147/93 | HR 101 | Ht 64.0 in | Wt 158.0 lb

## 2020-02-26 DIAGNOSIS — Z01419 Encounter for gynecological examination (general) (routine) without abnormal findings: Secondary | ICD-10-CM

## 2020-02-26 DIAGNOSIS — M545 Low back pain, unspecified: Secondary | ICD-10-CM | POA: Insufficient documentation

## 2020-02-26 DIAGNOSIS — R1032 Left lower quadrant pain: Secondary | ICD-10-CM | POA: Insufficient documentation

## 2020-02-26 DIAGNOSIS — K5900 Constipation, unspecified: Secondary | ICD-10-CM

## 2020-02-26 DIAGNOSIS — F32A Depression, unspecified: Secondary | ICD-10-CM

## 2020-02-26 LAB — POCT URINALYSIS DIPSTICK
Glucose, UA: NEGATIVE
Ketones, UA: NEGATIVE
Leukocytes, UA: NEGATIVE
Nitrite, UA: NEGATIVE
Protein, UA: NEGATIVE

## 2020-02-26 NOTE — Progress Notes (Signed)
Patient ID: Bethany Little, female   DOB: 06-22-1985, 34 y.o.   MRN: [id number] History of Present Illness: Bethany Little is a 34 year old white female,divorced, has SO, G2P1011, in for a GYN exam and pap. She has had pelvic pain since early September and had had an appendectomy by Dr Constance Haw and pain persists, had negative GYN,abdomainal and renal US 02/21/20. She says she can function with ibuprofen  And phenergan.  SO is with her again today.  PCP is Dr Sherril Cong.    Current Medications, Allergies, Past Medical History, Past Surgical History, Family History and Social History were reviewed in [employer] record.     Review of Systems: Patient denies any headaches, hearing loss, fatigue, blurred vision, shortness of breath, chest pain, problems with intercourse. No joint pain or mood swings. +pelvic pain, +constipation and has problems with urination at times, she says was told had IC years ago. +nausea  +dysmenorrhea for years   Physical Exam:BP (!) 147/93 (BP Location: Left Arm, Patient Position: Sitting, Cuff Size: Normal)   Pulse (!) 101   Ht 5\' 4"  (1.626 m)   Wt 158 lb (71.7 kg)   LMP 02/05/2020   BMI 27.12 kg/m urine trace blood General:  Well developed, well nourished, no acute distress Skin:  Warm and dry Neck:  Midline trachea, normal thyroid, good ROM, no lymphadenopathy Lungs; Clear to auscultation bilaterally Breast:  No dominant palpable mass, retraction, or nipple discharge Cardiovascular: Regular rate and rhythm Abdomen:  Soft, mildly tender, no hepatosplenomegaly Pelvic:  External genitalia is normal in appearance, no lesions.  The vagina is normal in appearance. Urethra has no lesions or masses. The cervix is bulbous.pap with high risk HPV genotyping performed.  Uterus is felt to be normal size, shape, and contour.  No adnexal masses,LLQ tenderness noted.Bladder is mildly tender, no masses felt. Rectal:deferred Extremities/musculoskeletal:  No  swelling or varicosities noted, no clubbing or cyanosis Psych:  No mood changes, alert and cooperative,seems sad  AA is 1 Fall risk is low PHQ 9 score is 11, no SI, and declines BH referral   Upstream - 02/26/20 1020      Pregnancy Intention Screening   Does the patient want to become pregnant in the next year? No    Does the patient's partner want to become pregnant in the next year? No    Would the patient like to discuss contraceptive options today? No      Contraception Wrap Up   Current Method Female Sterilization    End Method Female Sterilization    Contraception Counseling Provided No         Examination chaperoned by Dwyane Dee LPN Discussed with Dr Elonda Husky.  Impression and Plan: 1. Encounter for gynecological examination with Papanicolaou smear of cervix Pap sent Physical in 1 year Pap in 3 if normal  2. Low back pain, unspecified back pain laterality, unspecified chronicity, unspecified whether sciatica present  3. LLQ pain This is not GYN related  Will refer to [facility] Call me for follow up after seeing Gi, if symptoms persist may need to see urologist again  4. Constipation, unspecified constipation type Will refer to [place]  5. Depression, unspecified depression type

## 2020-02-28 ENCOUNTER — Encounter: Payer: Self-pay | Admitting: Neurology

## 2020-02-28 LAB — CYTOLOGY - PAP
Adequacy: ABSENT
Comment: NEGATIVE
Diagnosis: NEGATIVE
High risk HPV: NEGATIVE

## 2020-02-28 NOTE — Progress Notes (Signed)
Bethany Little (KeyTora Perches) Rx #: [id number] [name] 100MG  tablets   Form [employer] [facility] Electronic [place] Form [phone] NCPDP) Created 15 minutes ago Sent to Plan 3 minutes ago Plan Response 3 minutes ago Submit Clinical Questions 2 minutes ago Determination Favorable 2 minutes ago Message from Plan PA Case: [id number], Status: Approved, Coverage Starts on: 02/28/2020 12:00:00 AM, Coverage Ends on: 02/27/2021 12:00:00 AM.

## 2020-02-28 NOTE — Progress Notes (Addendum)
Lallie Sinko Key: [id number] - PA Case ID: [id number] - Rx #: [id number] Need help? Call us at [phone] Outcome Approvedtoday PA Case: [id number], Status: Approved, Coverage Starts on: 02/28/2020 12:00:00 AM, Coverage Ends on: 02/27/2021 12:00:00 AM. Drug Emgality 120MG /ML auto-injectors (migraine) Form [employer] Healthy [facility] Electronic [place] Form [id number] NCPDP) Original Claim Info 68

## 2020-02-29 ENCOUNTER — Ambulatory Visit: Payer: Medicaid Other | Admitting: Neurology

## 2020-03-03 NOTE — Procedures (Signed)
ELECTROENCEPHALOGRAM REPORT  Dates of Recording: 02/18/2020 12:11PM to 02/21/2020 12:05PM (no EEG recording from 02/20/20 8:49AM to 02/20/20 10:28PM)  Patient's Name: Bethany Little MRN: [id number] Date of Birth: Mar 05, 1986  Referring Provider: Dr. Ellouise Newer  Procedure: 58:17-hour ambulatory EEG (patient did not turn on video)  History: This is a 34 year old woman with staring episodes, loss of time, uneasy feeling.   Medications:  Lamotrigine Topiramate Ajovy Flexeril Benadryl Imitrex [name] Technical Summary: This is a 58-hour multichannel digital EEG recording measured by the international 10-20 system with electrodes applied with paste and impedances below 5000 ohms performed as portable with EKG monitoring.  The digital EEG was referentially recorded, reformatted, and digitally filtered in a variety of bipolar and referential montages for optimal display.  There was no EEG recording from 02/20/20 8:49AM to 02/20/20 10:28PM.   DESCRIPTION OF RECORDING: During maximal wakefulness, the background activity consisted of a symmetric 10 Hz posterior dominant rhythm which was reactive to eye opening.  There were no epileptiform discharges or focal slowing seen in wakefulness.  During the recording, the patient progresses through wakefulness, drowsiness, and Stage 2 sleep.  Again, there were no epileptiform discharges seen.  Events: On 9/28 between 0730 to 0735 hours, she had a headache, felt confused on the way to school. Patient not on video. Electrographically, there were no EEG or EKG changes seen.  On 9/28 at 0630 hours, she woke up scared, she was up several times due to headache. Patient not on video. Electrographically, there were no EEG or EKG changes seen.  On 9/28 at 1130 hours, she has a migraine and cannot think straight. Patient not on video. Electrographically, there were no EEG or EKG changes seen.  On 9/28 at 1548 hours, she was on the phone with speech changes.  Patient not on video. Electrographically, there were no EEG or EKG changes seen.  On 9/28 at 1606 and 1830 hours,she mixed words and numbers. Patient not on video. Electrographically, there were no EEG or EKG changes seen.   On 9/28 at 1848 hours, she "forgot to truck door in public." Patient not on video. Electrographically, there were no EEG or EKG changes seen.  On 9/28 at 1917 hours, she mixed words right/left. Patient not on video. Electrographically, there were no EEG or EKG changes seen.  On 9/29 at 0623 hours, she woke up with head hurting, felt hot. Patient not on video. Electrographically, there were no EEG or EKG changes seen.  On 9/29 at 1023 hours, she had a migraine, very upset head, stomach. No EEG recording at this time.   On 9/29 at 1128 hours, she mixed tools up helping. No EEG recording at this time.  On 9/29 at 2030 hours, she mixed words, forgot what she was doing. No EEG recording at this time.   There were other push button events not noted on diary for word difficulties. There were no EEG changes seen with push button events not described above.   There were no electrographic seizures seen.  EKG lead was unremarkable.  IMPRESSION: This 58-hour ambulatory EEG study is normal.  Episodes of mixed words, confusion, waking up scared, word difficulty, headache, did not show any epileptiform correlate.   CLINICAL CORRELATION: A normal EEG does not exclude a clinical diagnosis of epilepsy. Typical episodes of staring/loss of time were not reported. If further clinical questions remain, inpatient video EEG monitoring may be helpful.   Ellouise Newer, M.D.

## 2020-03-12 ENCOUNTER — Telehealth: Payer: Self-pay | Admitting: Neurology

## 2020-03-12 ENCOUNTER — Telehealth: Payer: Self-pay | Admitting: Adult Health

## 2020-03-12 NOTE — Telephone Encounter (Signed)
Pls let her know the EEG was normal, no seizure activity seen. The episodes she reported were not due to abnormal electrical activity in her brain. Continue same dose Lamictal. Follow-up with Dr. Tomi Likens and me as scheduled. Thanks

## 2020-03-12 NOTE — Telephone Encounter (Signed)
Spoke with Patient regarding her referral, I informed patient that her referral was denied by [facility] because she sees someone in [facility]. Patient states that she has not seen them in 7 years. I asked patient if she would like for me to sent a referral over there to [facility] but patient refused stating that they take to long and she is on a lot of pain and does not know why, and no one could figure out why. I informed patient that I will take a message for Anderson [facility] to see if there is another place she can go to for her GI needs. Please contact pt if referral is sent

## 2020-03-12 NOTE — Telephone Encounter (Signed)
Pt called no answer voice mail left to call the office back  

## 2020-03-12 NOTE — Telephone Encounter (Signed)
Pt calling to check on PAP results & to state that she never heard from the GI referral   Please advise & call pt

## 2020-03-12 NOTE — Telephone Encounter (Signed)
Spoke with pt EEG was normal, no seizure activity seen. The episodes she reported were not due to abnormal electrical activity in her brain Per Dr Delice Lesch . Continue same dose Lamictal. Follow-up with Dr. Tomi Likens and Dr Delice Lesch as scheduled

## 2020-03-12 NOTE — Telephone Encounter (Signed)
Left message letting pt know pap was normal. Pt hasn't heard from the GI office yet. Please advise. Thanks!! [place]

## 2020-03-12 NOTE — Telephone Encounter (Signed)
Patient is inquiring if her results from her EEG are back yet. Please call.

## 2020-03-14 NOTE — Telephone Encounter (Signed)
Pt aware of Jenn's recommendations and voiced understanding. [place]

## 2020-03-31 ENCOUNTER — Other Ambulatory Visit: Payer: Self-pay | Admitting: Neurology

## 2020-04-11 ENCOUNTER — Other Ambulatory Visit: Payer: Self-pay | Admitting: General Surgery

## 2020-04-11 ENCOUNTER — Other Ambulatory Visit: Payer: Self-pay | Admitting: Adult Health

## 2020-04-11 DIAGNOSIS — K5900 Constipation, unspecified: Secondary | ICD-10-CM

## 2020-04-22 ENCOUNTER — Ambulatory Visit: Payer: Medicaid Other | Admitting: Neurology

## 2020-04-30 ENCOUNTER — Other Ambulatory Visit: Payer: Self-pay | Admitting: Neurology

## 2020-05-27 ENCOUNTER — Telehealth (INDEPENDENT_AMBULATORY_CARE_PROVIDER_SITE_OTHER): Payer: Medicaid Other | Admitting: Neurology

## 2020-05-27 ENCOUNTER — Encounter: Payer: Self-pay | Admitting: Neurology

## 2020-05-27 ENCOUNTER — Other Ambulatory Visit: Payer: Self-pay

## 2020-05-27 VITALS — Ht 64.0 in | Wt 155.0 lb

## 2020-05-27 DIAGNOSIS — R413 Other amnesia: Secondary | ICD-10-CM

## 2020-05-27 DIAGNOSIS — F445 Conversion disorder with seizures or convulsions: Secondary | ICD-10-CM

## 2020-05-27 MED ORDER — LAMOTRIGINE 200 MG PO TABS
200.0000 mg | ORAL_TABLET | Freq: Two times a day (BID) | ORAL | 11 refills | Status: DC
Start: 2020-05-27 — End: 2021-06-30
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
## 2020-05-30 ENCOUNTER — Other Ambulatory Visit: Payer: Self-pay | Admitting: Neurology

## 2020-06-02 ENCOUNTER — Other Ambulatory Visit: Payer: Self-pay | Admitting: Adult Health

## 2020-06-30 ENCOUNTER — Other Ambulatory Visit: Payer: Self-pay | Admitting: Neurology

## 2020-07-09 NOTE — Progress Notes (Deleted)
NEUROLOGY FOLLOW UP OFFICE NOTE  Bethany Little [id number]   Subjective:  Bethany Little is a 35year old right-handed womanwithhypertension, chronic pain, depression,tobacco use disorderand history ofpseudoseizure, cervical cancer and leukemiawhofollows up for migraines.  She is accompanied by her spouse who supplements history.  UPDATE: Due to cognitive deficits/short-term memory loss, she was tapered off of topiramate in August.  Ajovy was changed to [facility] as well.   Intensity:  Moderate (doesn't need to stay in bed often) Duration:  Relief within 10 minutes with sumatriptan injection.  However, it is painful so she tries to avoid it.   Frequency:  On average 2 to 3 days a week.  1 to 2 days a week she has no headache.  Ajovy tends to wear off in last 2 weeks. She sometimes has swelling behind the left jaw and when she presses it, her right eye closes.   Current NSAIDS:none Current analgesics:none Current triptans:none Current ergotamine:None Current anti-emetic:Zofran 4 mg Current muscle relaxants:Flexeril 10mg  Current anti-anxiolytic:None Current sleep aide:none Current Antihypertensive medications:none Current Antidepressant medications:None. She avoids antidepressants due to side effects to multiple antidepressants. Current Anticonvulsant medications:lamotrigine 150mg  twice daily (for mood) Current anti-CGRP:Emgality, Ubrelvy 100mg  Current Vitamins/Herbal/Supplements:B12 Current Antihistamines/Decongestants:Benadryl Other therapy:None Hormone/birth control:No  Caffeine:No coffee.  Diet:Drinks about 40 oz water daily. Poor appetite due to nausea and vomiting. Exercise:No Depression:no; Anxiety:yes.  Irritable due to the headache.   Other pain:Diffuse pain Sleep hygiene:Poor due to headaches. Tired in the morning but more energy by the afternoon but fatigued again in evening.Melatonin not working. Medicaid  said they would not cover her sleep study.  HISTORY: Onset:She has prior history of episodic migraines but they became frequent and intractable inJune 2019. No known preceding event. Started taking pain relievers and headaches gradually got worse. Location:Right sided (frontal/periorbital and temple) Quality:Throbbing Initial intensity:SevereIt may wake her up at night. Shedenies new headache, thunderclap headache  Aura:No Prodrome:No Postdrome:fatigue Associated symptoms: Nausea, vomiting,photophobia, phonophobia, blurred vision(particularly in right eye, eye lacrimation, right frontal swelling.The pain causes her to feel confused and irritable.Shedenies associated unilateral numbness or weakness. Initial duration:Constant back round headache. Flare-ups last several hours (over 4 hours) InitialFrequency:Daily InitialFrequency of abortive medication:Takes Fioricet 4-5 days a week. Triggers: Unknown Exacerbating factors:Closing her eyes, light, sound Relieving factors: Rest Activity:aggravates  MRI of brain without contrast from 06/17/18 personally reviewed and was normal.  Past NSAIDS:Naproxen, ibuprofen Past analgesics:Excedrin, Tylenol, Fioricet Past abortive triptans:Maxalt, sumatriptan 100mg , sumatriptan [place] Past abortive ergotamine:None Past muscle relaxants:None Past anti-emetic:None Past antihypertensive medications:None Past antidepressant medications:Nortriptyline (irritability); venlafaxine (fatigue, felt sick) Past anticonvulsant medications:topiramate 100mg  twice daily (cognitive changes) Past anti-CGRP:Ajovy Past vitamins/Herbal/Supplements:None Past antihistamines/decongestants:None Other past therapies:Migraine cocktails (decreased intensity but not abort)  Family history of headache:On mother's side.  PAST MEDICAL HISTORY: Past Medical History:  Diagnosis Date  . Anxiety   . Arthritis   .  Cervical abnormality   . Cervical cancer ([place]) 2003  . Depression   . Heartburn   . Hypertension   . Interstitial cystitis   . Leukemia ([place])    Remission for 23 yrs  . Migraines   . Seizures ([place])   . Sleep apnea   . Vaginal Pap smear, abnormal     MEDICATIONS: Current Outpatient Medications on File Prior to Visit  Medication Sig Dispense Refill  . BIOTIN PO Take by mouth.    . cyclobenzaprine (FLEXERIL) 10 MG tablet Take 10 mg by mouth 3 (three) times daily as needed for muscle spasms.    . diphenhydrAMINE (BENADRYL) 25  mg capsule Take 25 mg by mouth.    . docusate sodium (COLACE) 100 MG capsule Take 1 capsule (100 mg total) by mouth 2 (two) times daily. 30 capsule 0  . Galcanezumab-gnlm (EMGALITY) 120 MG/ML SOAJ Inject 120 mg into the skin every 28 (twenty-eight) days. 1 mL 5  . ibuprofen (ADVIL) 800 MG tablet TAKE 1 TABLET BY MOUTH EVERY 8 HOURS AS NEEDED 60 tablet 1  . lamoTRIgine (LAMICTAL) 200 MG tablet Take 1 tablet (200 mg total) by mouth 2 (two) times daily. 60 tablet 11  . ondansetron (ZOFRAN-ODT) 4 MG disintegrating tablet DISSOLVE 1 TABLET UNDER THE TONGUE EVERY 8 HOURS AS NEEDED FOR NAUSEA OR VOMITING 30 tablet 0  . promethazine (PHENERGAN) 25 MG tablet TAKE 1 TABLET BY MOUTH EVERY 6 HOURS AS NEEDED FOR NAUSEA OR VOMITING 30 tablet 1  . SUMAtriptan (IMITREX) 6 MG/0.5ML SOLN injection Inject 0.5 mLs (6 mg total) into the skin every 2 (two) hours as needed for migraine or headache. May repeat in 2 hours if headache persists or recurs. 9 vial 3  . Ubrogepant (UBRELVY) 100 MG TABS Take 1 tablet for migraine.  May repeat in 2 hours if needed.  Maximum 2 tablets in 24 hours. 10 tablet 5   No current facility-administered medications on file prior to visit.    ALLERGIES: Allergies  Allergen Reactions  . Tetracycline Hcl Hives    Childhood allergy  . Erythromycin Hives    Childhood allergy  . Pertussis Vaccines Hives    Childhood allergy  . Reglan [Metoclopramide] Other  (See Comments)    Muscle Spasms  . Tetracyclines & Related Hives    Childhood allergy  . Latex Itching and Rash    FAMILY HISTORY: Family History  Problem Relation Age of Onset  . Depression Mother   . Bipolar disorder Mother   . COPD Mother   . Hypertension Mother   . Mental illness Mother   . Kidney disease Father        partial kidney fx  . COPD Father   . Pancreatic cancer Paternal Grandmother   . Celiac disease Paternal Grandmother   . Hypertension Paternal Grandmother   . Heart failure Paternal Grandfather   . Prostate cancer Other   . Healthy Son   . Bladder Cancer Neg Hx    ***.  SOCIAL HISTORY: Social History   Socioeconomic History  . Marital status: Divorced    Spouse name: Not on file  . Number of children: 1  . Years of education: Not on file  . Highest education level: Some college, no degree  Occupational History  . Occupation: unemployed  Tobacco Use  . Smoking status: Current Some Day Smoker    Packs/day: 1.00    Years: 10.00    Pack years: 10.00    Types: Cigarettes  . Smokeless tobacco: Never Used  Vaping Use  . Vaping Use: Never used  Substance and Sexual Activity  . Alcohol use: Not Currently  . Drug use: Yes    Types: Marijuana    Comment: daily in evening  . Sexual activity: Yes    Birth control/protection: Surgical    Comment: tubal  Other Topics Concern  . Not on file  Social History Narrative   Patient is right-handed. She lives with her boyfriend in a one level home. She drinks three glasses of tea and two 20 oz Dr. Lyndee Hensen a day. She does not exercise.   Social Determinants of Health   Financial Resource Strain:  Low Risk   . Difficulty of Paying Living Expenses: Not very hard  Food Insecurity: Food Insecurity Present  . Worried About [place] fundraiser in the Last Year: Never true  . Ran Out of Food in the Last Year: Sometimes true  Transportation Needs: No Transportation Needs  . Lack of Transportation (Medical): No   . Lack of Transportation (Non-Medical): No  Physical Activity: Sufficiently Active  . Days of Exercise per Week: 7 days  . Minutes of Exercise per Session: 30 min  Stress: Stress Concern Present  . Feeling of Stress : To some extent  Social Connections: Moderately Integrated  . Frequency of Communication with Friends and Family: Three times a week  . Frequency of Social Gatherings with Friends and Family: Once a week  . Attends Religious Services: 1 to 4 times per year  . Active Member of Clubs or Organizations: Yes  . Attends Archivist Meetings: More than 4 times per year  . Marital Status: Divorced  Human resources officer Violence: Not At Risk  . Fear of Current or Ex-Partner: No  . Emotionally Abused: No  . Physically Abused: No  . Sexually Abused: No     Objective:  *** General: No acute distress.  Patient appears well-groomed.   Head:  Normocephalic/atraumatic Eyes:  Fundi examined but not visualized Neck: supple, no paraspinal tenderness, full range of motion Heart:  Regular rate and rhythm Lungs:  Clear to auscultation bilaterally Back: No paraspinal tenderness Neurological Exam: alert and oriented to person, place, and time. Attention span and concentration intact, recent and remote memory intact, fund of knowledge intact.  Speech fluent and not dysarthric, language intact.  CN II-XII intact. Bulk and tone normal, muscle strength 5/5 throughout.  Sensation to light touch, temperature and vibration intact.  Deep tendon reflexes 2+ throughout, toes downgoing.  Finger to nose and heel to shin testing intact.  Gait normal, Romberg negative.   Assessment/Plan:   1.  Migraine without aura, without status migrainosus, not intractable 2.  Psychogenic nonepileptic seizures.  Managed by Dr. Delice Lesch  1.  Migraine prevention:  Emgality 2.  Migraine rescue:  Ubrelvy 100mg , Zofran for nausea 3.  ***  Metta Clines, DO  CC:  Beverlyn Roux, MD

## 2020-07-10 ENCOUNTER — Ambulatory Visit: Payer: Medicaid Other | Admitting: Neurology

## 2020-07-10 ENCOUNTER — Encounter: Payer: Self-pay | Admitting: Counselor

## 2020-07-10 ENCOUNTER — Ambulatory Visit: Payer: Medicaid Other

## 2020-07-10 ENCOUNTER — Other Ambulatory Visit: Payer: Self-pay

## 2020-07-10 ENCOUNTER — Ambulatory Visit (INDEPENDENT_AMBULATORY_CARE_PROVIDER_SITE_OTHER): Payer: Medicaid Other | Admitting: Counselor

## 2020-07-10 DIAGNOSIS — F09 Unspecified mental disorder due to known physiological condition: Secondary | ICD-10-CM | POA: Diagnosis not present

## 2020-07-10 DIAGNOSIS — F418 Other specified anxiety disorders: Secondary | ICD-10-CM | POA: Diagnosis not present

## 2020-07-10 DIAGNOSIS — F445 Conversion disorder with seizures or convulsions: Secondary | ICD-10-CM | POA: Diagnosis not present
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
## 2020-07-10 NOTE — Progress Notes (Signed)
   Psychometrist Note   Cognitive testing was administered to Bethany Little by Bethany Little, B.S. (Technician) under the supervision of Bethany Little, Psy.D., ABN. Bethany Little was able to tolerate all test procedures. Dr. Nicole Kindred met with the patient as needed to manage any emotional reactions to the testing procedures. Rest breaks were offered.    The battery of tests administered was selected by Dr. Nicole Kindred with consideration to the patient's current level of functioning, the nature of her symptoms, emotional and behavioral responses during the interview, level of literacy, observed level of motivation/effort, and the nature of the referral question. This battery was communicated to the psychometrist. Communication between Dr. Nicole Kindred and the psychometrist was ongoing throughout the evaluation and Dr. Nicole Kindred was immediately accessible at all times. Dr. Nicole Kindred provided supervision to the technician on the date of this service, to the extent necessary to assure the quality of all services provided.    Bethany Little will return in approximately one week for an interactive feedback session with Dr. Nicole Kindred, at which time test performance, clinical impressions, and treatment recommendations will be reviewed in detail. The patient understands she can contact our office should she require our assistance before this time.   A total of 125 minutes of billable time were spent with Bethany Little by the technician, including test administration and scoring time. Billing for these services is reflected in Dr. Les Pou note.   This note reflects time spent with the psychometrician and does not include test scores, clinical history, or any interpretations made by Dr. Nicole Kindred. The full report will follow in a separate note.

## 2020-07-11 NOTE — Progress Notes (Signed)
[facility]  Patient Name: Bethany Little MRN: [id number] Date of Birth: [birth year] Age: 35 y.o. Education: 13 years  Measurement properties of test scores: IQ, Index, and Standard Scores (SS): Mean = 100; Standard Deviation = 15 Scaled Scores (Ss): Mean = 10; Standard Deviation = 3 Z scores (Z): Mean = 0; Standard Deviation = 1 T scores (T); Mean = 50; Standard Deviation = 10  TEST SCORES:    Note: This summary of test scores accompanies the interpretive report and should not be interpreted by unqualified individuals or in isolation without reference to the report. Test scores are relative to age, gender, and educational history as available and appropriate.   Performance Validity        ACS: Raw Descriptor      Word Choice: 46 Within Expectation      MSVT: Raw Descriptor      IR 90 Within Expectation      DR 100 Within Expectation      CNS 90 Within Expectation      PA 90 ---      FR 55 ---      The Dot Counting Test: Raw Descriptor      E-Score 11 Within Expectation      Embedded Measures: Raw Descriptor      RBANS Effort Index: 0 Within Expectation      WAIS-IV Reliable Digit Span 8 Within Expectation      WAIS-IV Reliable Digit Span Revised 12 Within Expectation      Expected Functioning        Wide Range Achievement Test (Word Reading): Standard/Scaled Score Percentile       Word Reading 79 8       Spelling 83 13      Cognitive Testing        RBANS, Form : Standard/Scaled Score Percentile  Total Score 69 2  Immediate Memory 65 1      List Learning 4 2      Story Memory 4 2  Visuospatial/Constructional 81 10      Figure Copy   (19) 9 37      Judgment of Line Orientation   (10) --- <2  Language 51 <1      Picture Naming --- <2      Semantic Fluency 2 <1  Attention 88 21      Digit Span 8 25      Coding 8 25  Delayed Memory 94 34      List Recall   (8) --- 51-75      List Recognition   (19) --- 10-16       Story Recall   (7) 6 9      Figure Recall   (15) 9 37      Wechsler Adult Intelligence Scale - IV: Standard/Scaled Score Percentile  Working Memory Index 77 6      Digit Span 7 16          Digit Span Forward 5 5          Digit Span Backward 10 50          Digit Span Sequencing 7 16      Arithmetic 5 5  Processing Speed Index 81 10      Symbol Search 7 16      Coding 6 9      Neuropsychological Assessment Battery (Language Module): T-score Percentile      Naming   (  29) 41 18      Verbal Fluency: T-score Percentile      Controlled Oral Word Association (F-A-S) 37 9      Semantic Fluency (Animals) 46 34      Trail Making Test: T-Score Percentile      Part A 28 2      Part B 33 5      Modified Wisconsin Card Sorting Test (MWCST): Standard/T-Score Percentile      Number of Categories Correct 53 62      Number of Perseverative Errors 42 21      Number of Total Errors 43 25      Percent Perseverative Errors 43 25  Executive Function Composite 96 39          Boston Diagnostic Aphasia Exam: Raw Score Scaled Score      Complex Ideational Material 11 9      Rating Scales         Raw Score Descriptor  Patient Health Questionnaire - 9 16 Moderately-Severe  GAD-7 17 Severe    Melvine Julin V. Nicole Kindred PsyD, [place] Clinical Neuropsychologist

## 2020-07-11 NOTE — Progress Notes (Signed)
[facility]  Patient Name: ERANDY MCEACHERN MRN: [id number] Date of Birth: 08-17-1985 Age: 35 y.o. Education: 13 years  [place] is a 35 y.o., right-hand dominant, engaged woman with a history of HTN, chronic pain, depression, migraine headaches, and PNES. She follows with Dr. 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 She is reporting difficulties with word finding, pronouncing words, finishing sentences, and with short term memories. She also has difficulties with attention/concentration, organization, completing tasks on time, keeping track of belongings, and she gets discombobulated when driving. Importantly, she has not been having any accidents. She is not unable to do anything that she used to do related to her thinking and memory problems but she does have a harder time doing things. She had an MRI brain in 2020 that was normal.   On neuropsychological testing, there is suggestion of somewhat weak single word reading and spelling abilities, which is suggestive of a learning disorder in the context of her clinical history. Apart from that, she had difficulties with performance consistency in most domains, weak processing speed, and low levels of verbal memory encoding. The extent of her difficulties appears to be quite mild and she did well on some challenging executive measures and on delayed recall of visual and verbal information. She reported moderately severe levels of depressive symptoms, including little interest or pleasure and feeling down, depressed, or hopeless more than half the days over the past two weeks. She reported severe levels of anxiety symptoms.   Ms. Dech is thus demonstrating low scores suggestive of a learning disorder and executive control problems. She has a history of ADHD and these findings are broadly consistent with the type  of issues expected in that condition. I suspect that psychiatric issues are also playing a significant role given very high levels of reported symptoms and her timeline. She also has difficulties with sleep quantity/quality, and possibly also some degree of medication side effects. Treatment of her mental health issues should be prioritized at this time and may also help her sleep. She may also wish to consult with psychiatry regarding pharmacologic treatment for her ADHD, as she was previously on stimulant medications with some benefit.   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 You also had scattered low scores in several areas including on measures of memory encoding, working memory/processing efficiency, and executive function. These findings can be broadly summed up as involving difficulties with cognitive efficiency and executive control.   Executive control is a higher order cognitive ability involved in regulating other cognitive resources. Much like the conductor of an orchestra coordinates multiple instruments to make music, executive capacities coordinate other lower-order skills (e.g., movement, language, attention) to form complex human behaviors. Individuals with executive control problems are often capable of doing most of the things they did before they were having problems, but they may not do so as effortlessly, efficiently, and consistently. These difficulties often manifest as problems tracking information, multitasking, and paying attention. Executive control  problems often result in cognitive inefficiency and can present as "memory problems,"  because they decrease encoding and spontaneous retrieval of information.   In your case, the extent of difficulties appears to be fairly mild, that is to say, there were no very impaired or concerning findings. They are suggestive of the type of day-to-day issues you report, however. My sense is that your cognitive difficulties are most likely due to ongoing depression/anxiety, your history of attention-deficit/hyperactivity disorder, and also poor sleep quantity and quality. I think you can get back on track by getting these conditions under better control, especially your depression and anxiety. You scored in the moderately severe range for depressive symptoms and the severe range for anxiety symptoms.   Depression can affect cognitive functioning in several ways. For one, there are neurobiological changes in depression that can contribute to attention, concentration, and memory problems. These changes are so common they are part of the criteria we use to diagnose depressive disorders. Depression can also negatively impact your own appraisal of your cognitive abilities, leading you to feel like you are performing more poorly than is objectively warranted.  I would recommend that you engage in counseling, because this is the treatment of choice for both psychogenic nonepileptic seizures and for depression/anxiety. You could also consider consulting with Psychiatry, although you said you are doing much better on the Lamictal, which is also a mood medication.   We can discuss a referral to [facility] for psychotherapy.   I would be happy to offer you a referral to Psychiatry if desired. This could be helpful both for treatment of mood issues and for treatment of ADHD.   There is a significant research base and evidence of effectiveness for something called "behavioral activation," which is a fancy way of  saying that you should increase your activity level. In general, people do not feel as happy or do as well when they are not doing much. This can include things like getting out for walks, re-engaging in hobbies, spending time with family or friends, or learning a new hobby. It's not so important what you do as that you enjoy it and stick with it. Depression can start a vicious cycle where you are not doing a lot because you don't feel well, which leads to less things to be excited and happy about, and thus more depression and behavioral avoidance. It can be hard to change this pattern once it has started but most people find that they feel better when they start doing more even if they don't enjoy it at first.   There are few things as disruptive to brain functioning as not getting a good night's sleep. For sleep, I recommend against using medications, which can have lingering sedating effects on the brain and rob your brain of restful REM sleep. Instead, consider trying some of the following sleep hygiene recommendations. They may not work at once and may take effort, but the effort you spend is likely to be rewarded with better sleep eventually:  . Stick to a sleep schedule of the same bedtime and wake time even on the weekends, which can help to regulate your body's internal clock so that you fall asleep and stay asleep.  . Practice a relaxing bedtime ritual (conducted away from bright lights) which will help separate your sleep from stimulating activities and prepare your body to fall asleep when you go to bed.  . Avoid naps, especially in the afternoon.  . Evaluate your room and create conditions that will promote sleep such as keeping it cool (between 60 -  67 degrees), quiet, and free from any lights. Consider using blackout curtains, a "white noise" generator, or fan that will help mask any noises that might prevent you from going to sleep or awaken you during the night.  . Sleep on a comfortable  mattress and pillows.  . Avoid bright light in the evening and excessive use of portable electronic devices right before bed that may contain light frequencies that can contribute to sleep problems.  . Avoid alcohol, cigarettes, or heavy meals in the evening. If you must eat, consume a light snack 45 minutes before bed.  . Use your bed only for sleep to strengthen the association between your bed and sleep.  . If you can't go to sleep within 30 minutes, go into another room and do something relaxing until you feel tired. Then, come back and try to go to sleep again for 30 minutes and repeat until sleep is achieved.  . Some people find over the counter melatonin to be helpful for sleep, which you could discuss with a pharmacist or prescribing provider.   For problems with attention and concentration, consider the following recommendations: . Build routines into your day and stick to them, doing the same thing at the same time helps your body get into a rhythm that makes it harder to forget something you need to do.  . Use sticky notes, reminders, a calendar, or your smart phone to provide yourself with reminders, to do lists, and help track appointments.  . When you need to do work for school or other things, create an environment that is conducive to that work. This may include putting electronic devices that can be distracting outside the room and working in an area that is quiet and free from distractions.  . Break things down into smaller steps to help get started and stop yourself from feeling overwhelmed.  . Plan breaks throughout the day where you can get up and move even if it's for just a few minutes at a time.  . Focus your attention on only one thing and avoid multitasking. Although some people are better at it than others, nobody's task performance is as good as it could be when they are alternating attention between two different tasks.  . Stay mindful throughout your day and monitor  whether you are feeling overwhelmed or disorganized to identify problems before they happen.  . Avoid working under time pressure when you may be more liable to make mistakes.   Of course, you can return for reevaluation if desired in the future, if you feel that your difficulties are worsening over time. Because there are no indications of a progressive brain condition my expectations is that your difficulties will improve as you gain better control of your PNES, sleep, and other factors. You should not return sooner than 1 year.   Test Findings  Test scores are summarized in additional documentation associated with this encounter. Test scores are relative to age, gender, and educational history as available and appropriate. There were no concerns about performance validity as all findings fell within normal expectations.   General Intellectual Functioning/Achievement:  Performance on single word reading was weak, and presented at an unusually low level. Spelling was toward the bottom of the low average range. Taken in the context of the patient's clinical history, these findings suggest the possibility of a learning disorder. Her overall ability by demographic estimation falls in the upper half of the average range.   Attention and Processing Efficiency: Performance  on indicators of attention and working memory was weak, with an unusually low score on the Working Memory Index of the WAIS-IV. Scores were inconsistent with unusually low digit repetition forward then average digit repetition backward and low average digit resequencing in ascending order. She then achieved an average digit repetition forward score on a different measure. Mental solving of arithmetical word problems without paper and pencil was unusually low. Taken together these findings point more to performance consistency issues than they do consistently impaired working memory ability.   Performance on processing speed measures was  at the low end of the low average range on the overall index, with unusually low timed number symbol coding on one measure and then average timed number symbol coding on a different analogous measure. Performance on a measure involving efficient visual matching and visual scanning was low average. Once again, these findings point to difficulties with self-regulation and performance consistency as opposed to frank processing speed impairment per se.   Language: Language findings, similar to the above domains, were somewhat inconsistent. She demonstrated extremely low visual object confrontation naming on a screening measure but then had a normal low average score on another lengthier measure, which is viewed as more reliable. Generation of words by category was also inconsistent, with extremely low performance in response to the prompt "fruits and vegetables" and then average performance in response to the category prompt "animals." Generation of words in response to the letters F-A-S was weak, low average almost unusually low, which could be at least in part on the basis of phonologic decoding issues given suspected learning disorder.   Visuospatial Function: Visuospatial and constructional functioning was variable, similar to her performance in other domains. She performed at a low average level overall. Figure copy was average whereas judgment of angular line orientations was extremely low.   Learning and Memory: Learning and memory measures showed diminished encoding of information in the setting of generally within expectation delayed recall and recognition performance. This may be on the basis of cognitive efficiency and or executive control factors.   In the verbal realm, her immediate recall for a 10-item word list was 1, 5, 7, and 8 words, which is poor single trial learning and an extremely low score. She then recalled all 8 words on long-delayed recall, which is average to high average. Yes/no  recognition for the words from the list versus foils was low average. Immediate recall of a short story was extremely low but once again, her retention was better, this time generating an unusually low score.   In the visual realm, delayed recall of a modestly complex geometric figure was average.   Executive Functions: Performance on measures of executive function was inconsistent. She did well on a rule based categorization procedure involving card sorting, generating an average score. She had a reasonable low average perseverative errors score and an average categories correct score. She then had a harder time on alternating sequencing of numbers and letters of the alphabet, which was unusually low. Generation of words in response to the letters F-A-S was weak (low average almost unusually low). She did well when reasoning with verbal information on the Complex Ideational Material, with an average score.    Rating Scale(s): Ms. Dixson reported moderately severe levels of depression symptoms and severe levels of anxiety symptoms on self-rating scales, which is a bit more than her report on interview, and may reflect some alexithymia.   Viviano Simas Nicole Kindred PsyD, [place] Clinical Neuropsychologist

## 2020-07-17 ENCOUNTER — Other Ambulatory Visit: Payer: Self-pay

## 2020-07-17 ENCOUNTER — Ambulatory Visit (INDEPENDENT_AMBULATORY_CARE_PROVIDER_SITE_OTHER): Payer: Medicaid Other | Admitting: Counselor

## 2020-07-17 ENCOUNTER — Encounter: Payer: Self-pay | Admitting: Counselor

## 2020-07-17 DIAGNOSIS — F445 Conversion disorder with seizures or convulsions: Secondary | ICD-10-CM | POA: Diagnosis not present

## 2020-07-17 DIAGNOSIS — F819 Developmental disorder of scholastic skills, unspecified: Secondary | ICD-10-CM | POA: Diagnosis not present

## 2020-07-17 DIAGNOSIS — F900 Attention-deficit hyperactivity disorder, predominantly inattentive type: Secondary | ICD-10-CM | POA: Diagnosis not present

## 2020-07-17 NOTE — Patient Instructions (Signed)
Your performance on neuropsychological assessment suggested weakness in reading and spelling that corroborate the idea that you may have a learning disorder, in the context of your clinical history (you said that you always had a hard time in English and questioned if you did not have a learning disorder). You also had scattered low scores in several areas including on measures of memory encoding, working memory/processing efficiency, and executive function. These findings can be broadly summed up as involving difficulties with cognitive efficiency and executive control.   Executive control is a higher order cognitive ability involved in regulating other cognitive resources. Much like the conductor of an orchestra coordinates multiple instruments to make music, executive capacities coordinate other lower-order skills (e.g., movement, language, attention) to form complex human behaviors. Individuals with executive control problems are often capable of doing most of the things they did before they were having problems, but they may not do so as effortlessly, efficiently, and consistently. These difficulties often manifest as problems tracking information, multitasking, and paying attention. Executive control problems often result in cognitive inefficiency and can present as "memory problems," because they decrease encoding and spontaneous retrieval of information.   In your case, the extent of difficulties appears to be fairly mild, that is to say, there were no very impaired or concerning findings. They are suggestive of the type of day-to-day issues you report, however. My sense is that your cognitive difficulties are most likely due to ongoing depression/anxiety, your history of attention-deficit/hyperactivity disorder, and also poor sleep quantity and quality. I think you can get back on track by getting these conditions under better control, especially your depression and anxiety. You scored in the  moderately severe range for depressive symptoms and the severe range for anxiety symptoms.   Depression can affect cognitive functioning in several ways. For one, there are neurobiological changes in depression that can contribute to attention, concentration, and memory problems. These changes are so common they are part of the criteria we use to diagnose depressive disorders. Depression can also negatively impact your own appraisal of your cognitive abilities, leading you to feel like you are performing more poorly than is objectively warranted.  I would recommend that you engage in counseling, because this is the treatment of choice for both psychogenic nonepileptic seizures and for depression/anxiety. You could also consider consulting with Psychiatry, although you said you are doing much better on the Lamictal, which is also a mood medication.   We can discuss a referral to [facility] for psychotherapy.   We discussed treatment for ADHD, and you could look into [facility], who specialize in the condition. They can be reached at [phone]. They are at [facility]. [address] in [place], [place].   There is a significant research base and evidence of effectiveness for something called "behavioral activation," which is a fancy way of saying that you should increase your activity level. In general, people do not feel as happy or do as well when they are not doing much. This can include things like getting out for walks, re-engaging in hobbies, spending time with family or friends, or learning a new hobby. It's not so important what you do as that you enjoy it and stick with it. Depression can start a vicious cycle where you are not doing a lot because you don't feel well, which leads to less things to be excited and happy about, and thus more depression and behavioral avoidance. It can be hard to change this pattern  once it has started but most people find that  they feel better when they start doing more even if they don't enjoy it at first.   There are few things as disruptive to brain functioning as not getting a good night's sleep. For sleep, I recommend against using medications, which can have lingering sedating effects on the brain and rob your brain of restful REM sleep. Instead, consider trying some of the following sleep hygiene recommendations. They may not work at once and may take effort, but the effort you spend is likely to be rewarded with better sleep eventually:   Stick to a sleep schedule of the same bedtime and wake time even on the weekends, which can help to regulate your body's internal clock so that you fall asleep and stay asleep.   Practice a relaxing bedtime ritual (conducted away from bright lights) which will help separate your sleep from stimulating activities and prepare your body to fall asleep when you go to bed.   Avoid naps, especially in the afternoon.   Evaluate your room and create conditions that will promote sleep such as keeping it cool (between 60 - 67 degrees), quiet, and free from any lights. Consider using blackout curtains, a "white noise" generator, or fan that will help mask any noises that might prevent you from going to sleep or awaken you during the night.   Sleep on a comfortable mattress and pillows.   Avoid bright light in the evening and excessive use of portable electronic devices right before bed that may contain light frequencies that can contribute to sleep problems.   Avoid alcohol, cigarettes, or heavy meals in the evening. If you must eat, consume a light snack 45 minutes before bed.   Use your bed only for sleep to strengthen the association between your bed and sleep.   If you can't go to sleep within 30 minutes, go into another room and do something relaxing until you feel tired. Then, come back and try to go to sleep again for 30 minutes and repeat until sleep is achieved.   Some  people find over the counter melatonin to be helpful for sleep, which you could discuss with a pharmacist or prescribing provider.   For problems with attention and concentration, consider the following recommendations:  Build routines into your day and stick to them, doing the same thing at the same time helps your body get into a rhythm that makes it harder to forget something you need to do.   Use sticky notes, reminders, a calendar, or your smart phone to provide yourself with reminders, to do lists, and help track appointments.   When you need to do work for school or other things, create an environment that is conducive to that work. This may include putting electronic devices that can be distracting outside the room and working in an area that is quiet and free from distractions.   Break things down into smaller steps to help get started and stop yourself from feeling overwhelmed.   Plan breaks throughout the day where you can get up and move even if it's for just a few minutes at a time.   Focus your attention on only one thing and avoid multitasking. Although some people are better at it than others, nobody's task performance is as good as it could be when they are alternating attention between two different tasks.   Stay mindful throughout your day and monitor whether you are feeling overwhelmed or disorganized to  identify problems before they happen.   Avoid working under time pressure when you may be more liable to make mistakes.   Of course, you can return for reevaluation if desired in the future, if you feel that your difficulties are worsening over time. Because there are no indications of a progressive brain condition my expectations is that your difficulties will improve as you gain better control of your PNES, sleep, and other factors. You should not return sooner than 1 year.

## 2020-07-17 NOTE — Progress Notes (Signed)
[facility]  Feedback Note: I met with Bethany Little to review the findings resulting from her neuropsychological evaluation. Since the last appointment, she had a migraine that lasted 3 days. She unfortunately had to cancel her appointment with Dr. Tomi Likens for insurance issues, but she has another scheduled in April. Time was spent reviewing the impressions and recommendations that are detailed in the evaluation report. I was candid that so long as a medical cause for her seizures has been ruled out, psychiatric factors are the most likely cause of her memory issues. 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 I was also candid with her that the best treatment for this is psychotherapy, likely longer term. I reviewed with her considerations in choosing a provider. She did not wish for a referral and will think about it. Also reviewed sleep hygiene and provided her with the number for [facility] for ADHD treatment, as reflected in the patient instructions. I took time to explain the findings and answer all the patient's questions. I encouraged Bethany Little to contact me should she have any further questions or if further follow up is desired.   Current Medications and Medical History   Current Outpatient Medications  Medication Sig Dispense Refill  . BIOTIN PO Take by mouth.    . cyclobenzaprine (FLEXERIL) 10 MG tablet Take 10 mg by mouth 3 (three) times daily as needed for muscle spasms.    . diphenhydrAMINE (BENADRYL) 25 mg capsule Take 25 mg by mouth.    . docusate sodium (COLACE) 100 MG capsule Take 1 capsule (100 mg total) by mouth 2 (two) times daily. 30 capsule 0  . Galcanezumab-gnlm (EMGALITY) 120 MG/ML SOAJ Inject 120 mg into the skin every 28 (twenty-eight) days. 1 mL 5  .  ibuprofen (ADVIL) 800 MG tablet TAKE 1 TABLET BY MOUTH EVERY 8 HOURS AS NEEDED 60 tablet 1  . lamoTRIgine (LAMICTAL) 200 MG tablet Take 1 tablet (200 mg total) by mouth 2 (two) times daily. 60 tablet 11  . ondansetron (ZOFRAN-ODT) 4 MG disintegrating tablet DISSOLVE 1 TABLET UNDER THE TONGUE EVERY 8 HOURS AS NEEDED FOR NAUSEA OR VOMITING 30 tablet 0  . promethazine (PHENERGAN) 25 MG tablet TAKE 1 TABLET BY MOUTH EVERY 6 HOURS AS NEEDED FOR NAUSEA OR VOMITING 30 tablet 1  . SUMAtriptan (IMITREX) 6 MG/0.5ML SOLN injection Inject 0.5 mLs (6 mg total) into the skin every 2 (two) hours as needed for migraine or headache. May repeat in 2 hours if headache persists or recurs. 9 vial 3  . Ubrogepant (UBRELVY) 100 MG TABS Take 1 tablet for migraine.  May repeat in 2 hours if needed.  Maximum 2 tablets in 24 hours. 10 tablet 5   No current facility-administered medications for this visit.    Patient Active Problem List   Diagnosis Date Noted  . LLQ pain 02/26/2020  . Low back pain 02/26/2020  . Encounter for gynecological examination with Papanicolaou smear of cervix 02/26/2020  . Right upper quadrant abdominal tenderness without rebound tenderness 02/11/2020  . RLQ abdominal pain 02/11/2020  . Right flank pain 02/11/2020  . Hematuria 02/11/2020  . Lower abdominal pain 02/05/2020  . Constipation 02/05/2020  . Acute appendicitis 01/27/2020  . Bacterial vaginosis 01/27/2020  . Chronic intractable headache 05/31/2018  . Loss of memory 04/26/2018  .  Mood change 04/26/2018  . Seizure ([place]) 03/22/2016  . 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 Unspecified depressive disorder. 12/21/2015  . Cannabis use disorder, moderate, dependence ([place]) 12/21/2015  . Abnormal uterine bleeding 09/30/2015  . Chronic interstitial cystitis 09/30/2015  . Chronic pelvic pain in female 09/30/2015  . Cystitis, interstitial 01/08/2015  . Elevated blood pressure 04/06/2012  . Leukemia ([place]) 04/04/2012  . Amitriptyline overdose  04/03/2012  . Suicidal intent 04/03/2012  . 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 Speech was normal in rate, rhythm, volume, and prosody. Self-reported mood was "ok" and affect was neutral. Thought process was logical and goal oriented and thought content was appropriate to the topics discussed. There were no safety concerns identified at today's encounter, such as thoughts of harming self or others.   Plan  Feedback provided regarding the patient's neuropsychological evaluation. 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 Those difficulties may also be related to her headaches and sleep problems. Bethany Little was encouraged to contact me if any questions arise or if further follow up is desired.   Bethany Simas Nicole Kindred, PsyD, ABN Clinical Neuropsychologist  Service(s) Provided at This Encounter: 34 minutes [phone]; Conjoint therapy with patient present)

## 2020-07-30 ENCOUNTER — Other Ambulatory Visit: Payer: Self-pay | Admitting: Adult Health

## 2020-07-30 ENCOUNTER — Other Ambulatory Visit: Payer: Self-pay | Admitting: Neurology

## 2020-08-20 ENCOUNTER — Other Ambulatory Visit: Payer: Self-pay | Admitting: Neurology

## 2020-08-24 NOTE — Progress Notes (Signed)
Virtual Visit via Video Note The purpose of this virtual visit is to provide medical care while limiting exposure to the novel coronavirus.    Consent was obtained for video visit:  Yes.   Answered questions that patient had about telehealth interaction:  Yes.   I discussed the limitations, risks, security and privacy concerns of performing an evaluation and management service by telemedicine. I also discussed with the patient that there may be a patient responsible charge related to this service. The patient expressed understanding and agreed to proceed.  Pt location: Home Physician Location: office Name of referring provider:  Steele Sizer, MD I connected with Bethany Little at patients initiation/request on 08/25/2020 at  3:30 PM EDT by video enabled telemedicine application and verified that I am speaking with the correct person using two identifiers. Pt MRN:  [id number] Pt DOB:  27-Nov-1985 Video Participants:  Bethany Little  Assessment and Plan:   1.  Migraine without aura, without status migrainosus, not intractable - overall migraine frequency is down, however they do not respond well to oral rescue medications.  Sumatriptan [place] effective but painful.  I think she would benefit from a nasal spray 2.  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  Migraine prevention:  Emgality - the injection is painful but she wishes to continue for now.  Alternatively, we can try Qulipta if needed. 2.  Migraine rescue:  Tosymra NS.  Stop Ubrelvy 3.  Limit use of pain relievers to no more than 2 days out of week to prevent risk of rebound or medication-overuse headache. 4.  Keep headache diary 5.  Follow up in 6 months.  History of Present Illness:  Bethany Little is a 35year old right-handed womanwithhypertension, chronic pain, depression,tobacco use disorderand history ofpseudoseizure, cervical cancer and leukemiawhofollows up for migraines.  She is accompanied  by her spouse who supplements history.  UPDATE:   In August, Ajovy was switched to [employer].  Migraine frequency improved overall but they have slightly increased this past month (reports increased emotional stress related including finding out her father has terminal lung cancer).  However, [name] does not work as well as the sumatriptan injection.  Intensity:  Moderate (doesn't need to stay in bed often) Duration:  24 to 48 hours with  [name]  Frequency:  7 migraines in March  She reports difficulty pronouncing words and finishing sentences.  She also reports difficulty with short term memory.  For example, she forgot her wallet. Therefore I tapered her off of topiramate.  She underwent neuropsychological evaluation with Dr. 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  Current NSAIDS:none Current analgesics:none Current triptans:none Current ergotamine:None Current anti-emetic:Zofran 4 mg Current muscle relaxants:Flexeril 10mg  Current anti-anxiolytic:None Current sleep aide:none Current Antihypertensive medications:none Current Antidepressant medications:None. She avoids antidepressants due to side effects to multiple antidepressants. Current Anticonvulsant medications:lamotrigine 150mg  twice daily (for mood) Current anti-CGRP: Emgality, Ubrelvy 100mg  Current Vitamins/Herbal/Supplements:B12 Current Antihistamines/Decongestants:Benadryl Other therapy:None Hormone/birth control:No  Caffeine:No coffee.  Diet:Drinks about 40 oz water daily. Poor appetite due to nausea and vomiting. Exercise:No Depression:yes; Anxiety:yes.     Other pain:Diffuse pain Sleep hygiene:Poor due to headaches. Tired in the morning but more energy by the afternoon but fatigued again in evening.Melatonin not working. Medicaid said they would not cover her sleep  study.  HISTORY: Onset:She has prior history of episodic migraines but they became frequent and intractable inJune 2019. No known preceding event. Started taking pain  relievers and headaches gradually got worse. Location:Right sided (frontal/periorbital and temple) Quality:Throbbing Initial intensity:SevereIt may wake her up at night. Shedenies new headache, thunderclap headache  Aura:No Prodrome:No Postdrome:fatigue Associated symptoms: Nausea, vomiting,photophobia, phonophobia, blurred vision(particularly in right eye, eye lacrimation, right frontal swelling.The pain causes her to feel confused and irritable.Shedenies associated unilateral numbness or weakness. Initial duration:Constant back round headache. Flare-ups last several hours (over 4 hours) InitialFrequency:Daily InitialFrequency of abortive medication:Takes Fioricet 4-5 days a week. Triggers: Unknown Exacerbating factors:Closing her eyes, light, sound Relieving factors: Rest Activity:aggravates  MRI of brain without contrast from 06/17/18 personally reviewed and was normal.  Past NSAIDS:Naproxen, ibuprofen Past analgesics:Excedrin, Tylenol, Fioricet Past abortive triptans:Maxalt, sumatriptan 100mg , sumatriptan 6mg  Royal Past abortive ergotamine:None Past muscle relaxants:None Past anti-emetic:None Past antihypertensive medications:None Past antidepressant medications:Nortriptyline (irritability); venlafaxine (fatigue, felt sick) Past anticonvulsant medications:topiramate 100mg  BID Past anti-CGRP:Ajovy Past vitamins/Herbal/Supplements:None Past antihistamines/decongestants:None Other past therapies:Migraine cocktails (decreased intensity but not abort)  Family history of headache:On mother's side.  Past Medical History: Past Medical History:  Diagnosis Date  . Anxiety   . Arthritis   . Cervical abnormality   . Cervical cancer ([place]) 2003  .  Depression   . Heartburn   . Hypertension   . Interstitial cystitis   . Leukemia ([place])    Remission for 23 yrs  . Migraines   . Seizures ([place])   . Sleep apnea   . Vaginal Pap smear, abnormal     Medications: Outpatient Encounter Medications as of 08/25/2020  Medication Sig Note  . BIOTIN PO Take by mouth.   . cyclobenzaprine (FLEXERIL) 10 MG tablet Take 10 mg by mouth 3 (three) times daily as needed for muscle spasms.   . diphenhydrAMINE (BENADRYL) 25 mg capsule Take 25 mg by mouth. 03/10/2016: Received from: [facility]: Take 25 mg by mouth every six (6) hours as needed for itching.  . docusate sodium (COLACE) 100 MG capsule Take 1 capsule (100 mg total) by mouth 2 (two) times daily.   . Galcanezumab-gnlm (EMGALITY) 120 MG/ML SOAJ Inject 120 mg into the skin every 28 (twenty-eight) days.   Marland Kitchen ibuprofen (ADVIL) 800 MG tablet TAKE 1 TABLET BY MOUTH EVERY 8 HOURS AS NEEDED   . lamoTRIgine (LAMICTAL) 200 MG tablet Take 1 tablet (200 mg total) by mouth 2 (two) times daily.   . ondansetron (ZOFRAN-ODT) 4 MG disintegrating tablet DISSOLVE 1 TABLET UNDER THE TONGUE EVERY 8 HOURS AS NEEDED FOR NAUSEA OR VOMITING   . promethazine (PHENERGAN) 25 MG tablet TAKE 1 TABLET BY MOUTH EVERY 6 HOURS AS NEEDED FOR NAUSEA OR VOMITING   . SUMAtriptan (TOSYMRA) 10 MG/ACT SOLN 1 spray in one nostril.  May repeat every 1 hour.  Maximum 3 sprays in 24 hours.   . [DISCONTINUED] SUMAtriptan (IMITREX) 6 MG/0.5ML SOLN injection Inject 0.5 mLs (6 mg total) into the skin every 2 (two) hours as needed for migraine or headache. May repeat in 2 hours if headache persists or recurs.   . [DISCONTINUED] Ubrogepant (UBRELVY) 100 MG TABS Take 1 tablet for migraine.  May repeat in 2 hours if needed.  Maximum 2 tablets in 24 hours.    No facility-administered encounter medications on file as of 08/25/2020.    Allergies: Allergies  Allergen Reactions  . Tetracycline Hcl Hives    Childhood allergy  .  Erythromycin Hives    Childhood allergy  . Pertussis Vaccines Hives    Childhood allergy  . Reglan [Metoclopramide] Other (See Comments)    Muscle Spasms  .  Tetracyclines & Related Hives    Childhood allergy  . Latex Itching and Rash    Family History: Family History  Problem Relation Age of Onset  . Depression Mother   . Bipolar disorder Mother   . COPD Mother   . Hypertension Mother   . Mental illness Mother   . Kidney disease Father        partial kidney fx  . COPD Father   . Pancreatic cancer Paternal Grandmother   . Celiac disease Paternal Grandmother   . Hypertension Paternal Grandmother   . Heart failure Paternal Grandfather   . Prostate cancer Other   . Healthy Son   . Bladder Cancer Neg Hx     Observations/Objective:   Height 5\' 4"  (1.626 m), weight 155 lb (70.3 kg). No acute distress.  Alert and oriented.  Speech fluent and not dysarthric.  Language intact.    Follow Up Instructions:    -I discussed the assessment and treatment plan with the patient. The patient was provided an opportunity to ask questions and all were answered. The patient agreed with the plan and demonstrated an understanding of the instructions.   The patient was advised to call back or seek an in-person evaluation if the symptoms worsen or if the condition fails to improve as anticipated.    Dudley Major, DO

## 2020-08-25 ENCOUNTER — Telehealth (INDEPENDENT_AMBULATORY_CARE_PROVIDER_SITE_OTHER): Payer: Medicaid Other | Admitting: Neurology

## 2020-08-25 ENCOUNTER — Other Ambulatory Visit: Payer: Self-pay

## 2020-08-25 ENCOUNTER — Encounter: Payer: Self-pay | Admitting: Neurology

## 2020-08-25 VITALS — Ht 64.0 in | Wt 155.0 lb

## 2020-08-25 DIAGNOSIS — F445 Conversion disorder with seizures or convulsions: Secondary | ICD-10-CM

## 2020-08-25 DIAGNOSIS — G43009 Migraine without aura, not intractable, without status migrainosus: Secondary | ICD-10-CM

## 2020-08-25 MED ORDER — TOSYMRA 10 MG/ACT NA SOLN: NASAL | 5 refills | Status: DC

## 2020-08-25 NOTE — Patient Instructions (Signed)
1.  Continue Emgality every 28 days  2.  Stop Ubrelvy.  At earliest onset of migraine, use Tosymra (sumatriptan) nasal spray - 1 spray in one nostril.  May repeat every hour for total of 3 sprays if needed.  You will get a call or text from [place] - number may be [phone].   3. Limit use of pain relievers to no more than 2 days out of week to prevent risk of rebound or medication-overuse headache. 4.  Keep headache diary 5.  Follow up 6 months.

## 2020-09-01 ENCOUNTER — Encounter: Payer: Self-pay | Admitting: Neurology

## 2020-09-01 ENCOUNTER — Other Ambulatory Visit: Payer: Self-pay | Admitting: Neurology

## 2020-09-01 NOTE — Progress Notes (Signed)
Bethany Little (Key: [id number]) Tosymra 10MG /ACT solution   Form [employer] Electronic [place] Form [phone] NCPDP) Created 2 days ago Sent to Plan 1 minute ago Plan Response 1 minute ago Submit Clinical Questions less than a minute ago Determination Favorable less than a minute ago Message from Plan PA Case: [id number], Status: Approved, Coverage Starts on: 09/01/2020 12:00:00 AM, Coverage Ends on: 09/01/2021 12:00:00 AM.

## 2020-09-18 ENCOUNTER — Other Ambulatory Visit: Payer: Self-pay | Admitting: Neurology

## 2020-09-18 ENCOUNTER — Other Ambulatory Visit: Payer: Self-pay | Admitting: Adult Health

## 2020-10-02 ENCOUNTER — Other Ambulatory Visit: Payer: Self-pay

## 2020-10-02 ENCOUNTER — Emergency Department: Payer: Medicaid Other

## 2020-10-02 ENCOUNTER — Encounter: Payer: Self-pay | Admitting: Emergency Medicine

## 2020-10-02 ENCOUNTER — Emergency Department
Admission: EM | Admit: 2020-10-02 | Discharge: 2020-10-02 | Disposition: A | Discharge status: Home / Self Care | Payer: Medicaid Other | Attending: Emergency Medicine | Admitting: Emergency Medicine

## 2020-10-02 DIAGNOSIS — R10A0 Flank pain, unspecified side: Secondary | ICD-10-CM

## 2020-10-02 DIAGNOSIS — Z91040 Latex allergy status: Secondary | ICD-10-CM | POA: Diagnosis not present

## 2020-10-02 DIAGNOSIS — Z8541 Personal history of malignant neoplasm of cervix uteri: Secondary | ICD-10-CM | POA: Insufficient documentation

## 2020-10-02 DIAGNOSIS — I10 Essential (primary) hypertension: Secondary | ICD-10-CM | POA: Diagnosis not present

## 2020-10-02 DIAGNOSIS — R112 Nausea with vomiting, unspecified: Secondary | ICD-10-CM | POA: Diagnosis not present

## 2020-10-02 DIAGNOSIS — R109 Unspecified abdominal pain: Secondary | ICD-10-CM

## 2020-10-02 DIAGNOSIS — N3000 Acute cystitis without hematuria: Secondary | ICD-10-CM

## 2020-10-02 DIAGNOSIS — F17210 Nicotine dependence, cigarettes, uncomplicated: Secondary | ICD-10-CM | POA: Insufficient documentation

## 2020-10-02 DIAGNOSIS — B9689 Other specified bacterial agents as the cause of diseases classified elsewhere: Secondary | ICD-10-CM | POA: Insufficient documentation

## 2020-10-02 DIAGNOSIS — R300 Dysuria: Secondary | ICD-10-CM | POA: Diagnosis present

## 2020-10-02 LAB — URINALYSIS, COMPLETE (UACMP) WITH MICROSCOPIC
Bilirubin Urine: NEGATIVE
Glucose, UA: NEGATIVE mg/dL
Hgb urine dipstick: NEGATIVE
Ketones, ur: 20 mg/dL — AB
Nitrite: NEGATIVE
Protein, ur: NEGATIVE mg/dL
Specific Gravity, Urine: 1.016 (ref 1.005–1.030)
pH: 6 (ref 5.0–8.0)

## 2020-10-02 LAB — CBC
HCT: 44.4 % (ref 36.0–46.0)
Hemoglobin: 15 g/dL (ref 12.0–15.0)
MCH: 32.3 pg (ref 26.0–34.0)
MCHC: 33.8 g/dL (ref 30.0–36.0)
MCV: 95.5 fL (ref 80.0–100.0)
Platelets: 284 10*3/uL (ref 150–400)
RBC: 4.65 MIL/uL (ref 3.87–5.11)
RDW: 12.1 % (ref 11.5–15.5)
WBC: 11.8 10*3/uL — ABNORMAL HIGH (ref 4.0–10.5)
nRBC: 0 % (ref 0.0–0.2)

## 2020-10-02 LAB — BASIC METABOLIC PANEL
Anion gap: 12 (ref 5–15)
BUN: 12 mg/dL (ref 6–20)
CO2: 28 mmol/L (ref 22–32)
Calcium: 9.4 mg/dL (ref 8.9–10.3)
Chloride: 100 mmol/L (ref 98–111)
Creatinine, Ser: 0.94 mg/dL (ref 0.44–1.00)
GFR, Estimated: >60 mL/min (ref >60)
Glucose, Bld: 93 mg/dL (ref 70–99)
Potassium: 3.9 mmol/L (ref 3.5–5.1)
Sodium: 140 mmol/L (ref 135–145)

## 2020-10-02 LAB — PREGNANCY, URINE: Preg Test, Ur: NEGATIVE

## 2020-10-02 MED ORDER — SODIUM CHLORIDE 0.9 % IV BOLUS
1000.0000 mL | Freq: Once | INTRAVENOUS | Status: AC
  Administered 2020-10-02: 1000 mL via INTRAVENOUS

## 2020-10-02 MED ORDER — FENTANYL CITRATE (PF) 100 MCG/2ML IJ SOLN
100.0000 ug | Freq: Once | INTRAMUSCULAR | Status: AC
  Administered 2020-10-02: 100 ug via INTRAVENOUS
  Filled 2020-10-02: qty 2

## 2020-10-02 MED ORDER — ONDANSETRON 4 MG PO TBDP
4.0000 mg | ORAL_TABLET | Freq: Once | ORAL | Status: AC
  Administered 2020-10-02: 4 mg via ORAL
  Filled 2020-10-02: qty 1

## 2020-10-02 MED ORDER — CEPHALEXIN 500 MG PO CAPS: 500.0000 mg | ORAL_CAPSULE | Freq: Two times a day (BID) | ORAL | 0 refills | Status: AC

## 2020-10-02 MED ORDER — MORPHINE SULFATE (PF) 4 MG/ML IV SOLN
4.0000 mg | Freq: Once | INTRAVENOUS | Status: DC
  Filled 2020-10-02: qty 1

## 2020-10-02 MED ORDER — TRAMADOL HCL 50 MG PO TABS: 50.0000 mg | ORAL_TABLET | Freq: Four times a day (QID) | ORAL | 0 refills | Status: DC | PRN

## 2020-10-02 MED ORDER — ONDANSETRON HCL 4 MG/2ML IJ SOLN
4.0000 mg | Freq: Once | INTRAMUSCULAR | Status: AC
  Administered 2020-10-02: 4 mg via INTRAVENOUS
  Filled 2020-10-02: qty 2

## 2020-10-02 NOTE — Discharge Instructions (Signed)
Please follow-up with your primary care provider for symptoms that are not improving over the next few days.  Return to the emergency department for symptoms of change or worsening for unable to schedule an appointment.

## 2020-10-02 NOTE — ED Provider Notes (Signed)
[facility] Emergency Department Provider Note ____________________________________________   Event Date/Time   First MD Initiated Contact with Patient 10/02/20 1234     (approximate)  I have reviewed the triage vital signs and the nursing notes.   HISTORY  Chief Complaint Flank Pain  HPI Bethany Little is a 35 y.o. female with history of medical history as listed below presents to the emergency department for treatment and evaluation of right flank pain and dysuria. She also complains of nausea and vomiting. Symptoms started 3 days ago and has progressively worsened. She has been taking Ibuprofen 800mg , phenergan, and zofran without much relief. She felt the same prior to her appendectomy last fall.          Past Medical History:  Diagnosis Date  . Anxiety   . Arthritis   . Cervical abnormality   . Cervical cancer ([place]) 2003  . Depression   . Heartburn   . Hypertension   . Interstitial cystitis   . Leukemia ([place])    Remission for 23 yrs  . Migraines   . Seizures ([place])   . Sleep apnea   . Vaginal Pap smear, abnormal     Patient Active Problem List   Diagnosis Date Noted  . LLQ pain 02/26/2020  . Low back pain 02/26/2020  . Encounter for gynecological examination with Papanicolaou smear of cervix 02/26/2020  . Right upper quadrant abdominal tenderness without rebound tenderness 02/11/2020  . RLQ abdominal pain 02/11/2020  . Right flank pain 02/11/2020  . Hematuria 02/11/2020  . Lower abdominal pain 02/05/2020  . Constipation 02/05/2020  . Acute appendicitis 01/27/2020  . Bacterial vaginosis 01/27/2020  . Chronic intractable headache 05/31/2018  . Loss of memory 04/26/2018  . Mood change 04/26/2018  . Seizure ([place]) 03/22/2016  . 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 Unspecified depressive disorder. 12/21/2015  . Cannabis use disorder, moderate, dependence ([place]) 12/21/2015  . Abnormal uterine bleeding 09/30/2015  . Chronic interstitial  cystitis 09/30/2015  . Chronic pelvic pain in female 09/30/2015  . Cystitis, interstitial 01/08/2015  . Elevated blood pressure 04/06/2012  . Leukemia ([place]) 04/04/2012  . Amitriptyline overdose 04/03/2012  . Suicidal intent 04/03/2012  . 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 CARDIAC CATHETERIZATION     65 weeks old  . CESAREAN SECTION    . LAPAROSCOPIC APPENDECTOMY N/A 01/28/2020   Procedure: APPENDECTOMY LAPAROSCOPIC;  Surgeon: Virl Cagey, MD;  Location: [facility];  Service: General;  Laterality: N/A;  . LEEP    . TUBAL LIGATION      Prior to Admission medications   Medication Sig Start Date End Date Taking? Authorizing Provider  BIOTIN PO Take by mouth.   Yes [provider]  cephALEXin (KEFLEX) 500 MG capsule Take 1 capsule (500 mg total) by mouth 2 (two) times daily for 7 days. 10/02/20 10/09/20 Yes Raniyah Curenton B, FNP  cyclobenzaprine (FLEXERIL) 10 MG tablet Take 10 mg by mouth 3 (three) times daily as needed for muscle spasms.   Yes [provider]  EMGALITY 120 MG/ML SOAJ INJECT 120 MG (1 SYRINGE) INTO THE SKIN EVERY 28 DAYS. 09/01/20  Yes Jaffe, Adam R, DO  ibuprofen (ADVIL) 800 MG tablet TAKE 1 TABLET BY MOUTH EVERY 8 HOURS AS NEEDED 09/18/20  Yes Derrek Monaco A, NP  lamoTRIgine (LAMICTAL) 200 MG tablet Take 1 tablet (200 mg total) by mouth 2 (two) times daily. 05/27/20  Yes Cameron Sprang,  MD  ondansetron (ZOFRAN-ODT) 4 MG disintegrating tablet DISSOLVE 1 TABLET UNDER THE TONGUE EVERY 8 HOURS AS NEEDED FOR NAUSEA OR VOMITING 09/18/20  Yes Jaffe, Adam R, DO  promethazine (PHENERGAN) 25 MG tablet TAKE 1 TABLET BY MOUTH EVERY 6 HOURS AS NEEDED FOR NAUSEA OR VOMITING 09/18/20  Yes Derrek Monaco A, NP  traMADol (ULTRAM) 50 MG tablet Take 1 tablet (50 mg total) by mouth every 6 (six) hours as needed. 10/02/20  Yes Amyrie Illingworth B, FNP  diphenhydrAMINE (BENADRYL) 25 mg capsule Take 25 mg by  mouth.    [provider]  docusate sodium (COLACE) 100 MG capsule Take 1 capsule (100 mg total) by mouth 2 (two) times daily. 02/05/20   Virl Cagey, MD  SUMAtriptan (TOSYMRA) 10 MG/ACT SOLN 1 spray in one nostril.  May repeat every 1 hour.  Maximum 3 sprays in 24 hours. 08/25/20   Pieter Partridge, DO    Allergies Tetracycline hcl, Erythromycin, Pertussis vaccines, Reglan [metoclopramide], Tetracyclines & related, and Latex  Family History  Problem Relation Age of Onset  . Depression Mother   . Bipolar disorder Mother   . COPD Mother   . Hypertension Mother   . Mental illness Mother   . Kidney disease Father        partial kidney fx  . COPD Father   . Pancreatic cancer Paternal Grandmother   . Celiac disease Paternal Grandmother   . Hypertension Paternal Grandmother   . Heart failure Paternal Grandfather   . Prostate cancer Other   . Healthy Son   . Bladder Cancer Neg Hx     Social History Social History   Tobacco Use  . Smoking status: Current Some Day Smoker    Packs/day: 1.00    Years: 10.00    Pack years: 10.00    Types: Cigarettes  . Smokeless tobacco: Never Used  Vaping Use  . Vaping Use: Never used  Substance Use Topics  . Alcohol use: Not Currently  . Drug use: Yes    Types: Marijuana    Comment: daily in evening    Review of Systems  Constitutional: No fever/chills Eyes: No visual changes. ENT: No sore throat. Cardiovascular: Denies chest pain. Respiratory: Denies shortness of breath. Gastrointestinal: Positive for abdominal pain.  Positive for nausea, no vomiting. Positive for diarrhea. Positive for constipation. Genitourinary: Positive for dysuria. Musculoskeletal: Positive for back pain. Skin: Negative for rash. Neurological: Negative for headaches, focal weakness or numbness. ____________________________________________   PHYSICAL EXAM:  VITAL SIGNS: ED Triage Vitals  Enc Vitals Group     BP 10/02/20 1228 (!) 122/91     Pulse  Rate 10/02/20 1228 97     Resp 10/02/20 1228 18     Temp 10/02/20 1228 98.6 F (37 C)     Temp Source 10/02/20 1228 Oral     SpO2 10/02/20 1228 100 %     Weight 10/02/20 1228 162 lb (73.5 kg)     Height 10/02/20 1228 5\' 4"  (1.626 m)     Head Circumference --      Peak Flow --      Pain Score 10/02/20 1227 6     Pain Loc --      Pain Edu? --      Excl. in [place]? --     Constitutional: Alert and oriented. Well appearing and in no acute distress. Eyes: Conjunctivae are normal. Head: Atraumatic. Nose: No congestion/rhinnorhea. Mouth/Throat: Mucous membranes are moist. Oropharynx non-erythematous. Neck: No stridor.  Hematological/Lymphatic/Immunilogical: No cervical lymphadenopathy. Cardiovascular: Normal rate, regular rhythm. Grossly normal heart sounds. Good peripheral circulation. Respiratory: Normal respiratory effort.  No retractions. Lungs CTAB. Gastrointestinal: Soft and nontender. No distention. No abdominal bruits. Right side CVA tenderness. Genitourinary:  Musculoskeletal: No lower extremity tenderness nor edema.  No joint effusions. Neurologic:  Normal speech and language. No gross focal neurologic deficits are appreciated. No gait instability. Skin:  Skin is warm, dry and intact. No rash noted. Psychiatric: Mood and affect are normal. Speech and behavior are normal.  ____________________________________________   LABS (all labs ordered are listed, but only abnormal results are displayed)  Labs Reviewed  URINALYSIS, COMPLETE (UACMP) WITH MICROSCOPIC - Abnormal; Notable for the following components:      Result Value   Color, Urine YELLOW (*)    APPearance HAZY (*)    Ketones, ur 20 (*)    Leukocytes,Ua TRACE (*)    Bacteria, UA RARE (*)    All other components within normal limits  CBC - Abnormal; Notable for the following components:   WBC 11.8 (*)    All other components within normal limits  BASIC METABOLIC PANEL  PREGNANCY, URINE    ____________________________________________  EKG  Not indicated. ____________________________________________  RADIOLOGY  ED MD interpretation:    CT renal stone study negative for acute concerns.  I, Sherrie George, personally viewed and evaluated these images (plain radiographs) as part of my medical decision making, as well as reviewing the written report by the radiologist.  Official radiology report(s): CT Renal Stone Study  Result Date: 10/02/2020 CLINICAL DATA:  Right-sided flank pain EXAM: CT ABDOMEN AND PELVIS WITHOUT CONTRAST TECHNIQUE: Multidetector CT imaging of the abdomen and pelvis was performed following the standard protocol without IV contrast. COMPARISON:  01/27/2020 FINDINGS: Lower chest: No acute abnormality. Hepatobiliary: No focal liver abnormality is seen. No gallstones, gallbladder wall thickening, or biliary dilatation. Pancreas: Unremarkable. No pancreatic ductal dilatation or surrounding inflammatory changes. Spleen: Normal in size without focal abnormality. Adrenals/Urinary Tract: Adrenal glands are within normal limits. Kidneys demonstrate no renal calculi or obstructive changes. The ureters are within normal limits. Bladder is partially distended. Stomach/Bowel: Colon is predominately decompressed. No obstructive or inflammatory changes are seen. Findings of prior appendectomy are noted. Small bowel and stomach are within normal limits. Vascular/Lymphatic: No significant vascular findings are present. No enlarged abdominal or pelvic lymph nodes. Reproductive: Uterus and bilateral adnexa are unremarkable. Other: No abdominal wall hernia or abnormality. No abdominopelvic ascites. Musculoskeletal: No acute or significant osseous findings. IMPRESSION: No renal calculi or obstructive changes. Status post appendectomy. No acute abnormality noted. Electronically Signed   By: Inez Catalina M.D.   On: 10/02/2020 15:04     ____________________________________________   PROCEDURES  Procedure(s) performed (including Critical Care):  Procedures  ____________________________________________   INITIAL IMPRESSION / ASSESSMENT AND PLAN     35 year old female presenting to the emergency department for treatment and evaluation of right flank pain with dysuria, nausea, vomiting, and constipation with occasional diarrhea.  See HPI for further details.  She states that she had similar symptoms just prior to her appendectomy last fall.  She states that from what she understands prior to that surgery, her appendix was not necessarily the reason for her pain that she actually had swelling in her bowels.  She states they took her appendix out anyway.  She experienced a sensation of abdominal swelling and right flank pressure starting on Monday which is progressively worsened.  Her last episode of vomiting was this morning around  11:00.  She has not taken any Zofran or Phenergan  DIFFERENTIAL DIAGNOSIS  Acute cystitis, pyelonephritis, kidney stone, musculoskeletal flank pain.  ED COURSE  UA consistent with mild cystitis. Results discussed with patient.  Due to the degree of flank pain she reports, plan will be to do a CT renal stone study. She is aware and agrees with the plan. She reports some relief after Fentanyl and declines additional pain medication at this time.  Surgical note from appendectomy reviewed. No note of bowel related issues.   ----------------------------------------- 3:35 PM on 10/02/2020 -----------------------------------------  CT results reassuring. She will be discharged home. She continues to feel slightly nauseated. Zofran ODT ordered. She was encouraged to follow up with PCP if not improving over the next few days. She was also advised to return to the ER for symptoms that change or worsen if unable to schedule an appointment.    ___________________________________________   FINAL  CLINICAL IMPRESSION(S) / ED DIAGNOSES  Final diagnoses:  Flank pain  Acute cystitis without hematuria     ED Discharge Orders         Ordered    cephALEXin (KEFLEX) 500 MG capsule  2 times daily        10/02/20 1513    traMADol (ULTRAM) 50 MG tablet  Every 6 hours PRN        10/02/20 Conger was evaluated in Emergency Department on 10/02/2020 for the symptoms described in the history of present illness. She was evaluated in the context of the global COVID-19 pandemic, which necessitated consideration that the patient might be at risk for infection with the SARS-CoV-2 virus that causes COVID-19. Institutional protocols and algorithms that pertain to the evaluation of patients at risk for COVID-19 are in a state of rapid change based on information released by regulatory bodies including the CDC and federal and state organizations. These policies and algorithms were followed during the patient's care in the ED.   Note:  This document was prepared using Dragon voice recognition software and may include unintentional dictation errors.   Victorino Dike, FNP 10/02/20 1537    Vladimir Crofts, MD 10/03/20 2337

## 2020-10-02 NOTE — ED Notes (Signed)
Pt in CT.

## 2020-10-02 NOTE — ED Notes (Signed)
UCG added on in lab.

## 2020-10-02 NOTE — ED Notes (Signed)
Pt requested dose of nausea medication prior to d/c. MLP notified.

## 2020-10-02 NOTE — ED Triage Notes (Signed)
Pt comes into the ED via POV c/o right side flank pain.  Pt denies any h/o kidney stones but states that she does have discomfort with urination and having nausea and vomiting.  Pt states the pain started on Monday and progressively has gotten worse.  Pt currently ambulatory to the exam room with even and unlabored respirations.

## 2020-10-21 ENCOUNTER — Other Ambulatory Visit: Payer: Self-pay | Admitting: Adult Health

## 2020-11-20 ENCOUNTER — Other Ambulatory Visit: Payer: Self-pay | Admitting: Adult Health

## 2020-12-19 ENCOUNTER — Other Ambulatory Visit: Payer: Self-pay | Admitting: Adult Health

## 2021-01-13 NOTE — Progress Notes (Signed)
[name] '100mg'$  sent to [facility] pending.

## 2021-01-19 ENCOUNTER — Other Ambulatory Visit: Payer: Self-pay | Admitting: Adult Health

## 2021-01-19 ENCOUNTER — Telehealth: Payer: Self-pay

## 2021-01-19 NOTE — Telephone Encounter (Signed)
New message   Determination  Message from Plan  Available without authorization.  Bethany Little (Key: Casey Burkitt) [name] '100MG'$  tablets   Form [facility] Electronic [place] Form [phone] NCPDP) Created 4 minutes ago Sent to Plan 1 minute ago Plan Response 1 minute ago Submit Clinical Questions

## 2021-02-09 ENCOUNTER — Encounter: Payer: Self-pay | Admitting: *Deleted

## 2021-02-09 ENCOUNTER — Other Ambulatory Visit: Payer: Self-pay | Admitting: Adult Health

## 2021-02-10 ENCOUNTER — Ambulatory Visit
Admission: RE | Admit: 2021-02-10 | Discharge: 2021-02-10 | Disposition: A | Discharge status: Home / Self Care | Payer: Medicaid Other | Attending: Gastroenterology | Admitting: Gastroenterology

## 2021-02-10 ENCOUNTER — Encounter: Payer: Self-pay | Admitting: *Deleted

## 2021-02-10 ENCOUNTER — Other Ambulatory Visit: Payer: Self-pay

## 2021-02-10 ENCOUNTER — Ambulatory Visit: Payer: Medicaid Other | Admitting: Registered Nurse

## 2021-02-10 ENCOUNTER — Encounter
Admission: RE | Disposition: A | Discharge status: Home / Self Care | Payer: Self-pay | Source: Home / Self Care | Attending: Gastroenterology

## 2021-02-10 DIAGNOSIS — Z888 Allergy status to other drugs, medicaments and biological substances status: Secondary | ICD-10-CM | POA: Diagnosis not present

## 2021-02-10 DIAGNOSIS — F17200 Nicotine dependence, unspecified, uncomplicated: Secondary | ICD-10-CM | POA: Insufficient documentation

## 2021-02-10 DIAGNOSIS — K259 Gastric ulcer, unspecified as acute or chronic, without hemorrhage or perforation: Secondary | ICD-10-CM | POA: Diagnosis not present

## 2021-02-10 DIAGNOSIS — Z6827 Body mass index (BMI) 27.0-27.9, adult: Secondary | ICD-10-CM | POA: Insufficient documentation

## 2021-02-10 DIAGNOSIS — Z8379 Family history of other diseases of the digestive system: Secondary | ICD-10-CM | POA: Diagnosis not present

## 2021-02-10 DIAGNOSIS — R1031 Right lower quadrant pain: Secondary | ICD-10-CM | POA: Diagnosis not present

## 2021-02-10 DIAGNOSIS — Z79899 Other long term (current) drug therapy: Secondary | ICD-10-CM | POA: Insufficient documentation

## 2021-02-10 DIAGNOSIS — Z91040 Latex allergy status: Secondary | ICD-10-CM | POA: Diagnosis not present

## 2021-02-10 DIAGNOSIS — K2980 Duodenitis without bleeding: Secondary | ICD-10-CM | POA: Diagnosis not present

## 2021-02-10 DIAGNOSIS — K2970 Gastritis, unspecified, without bleeding: Secondary | ICD-10-CM | POA: Insufficient documentation

## 2021-02-10 DIAGNOSIS — Z881 Allergy status to other antibiotic agents status: Secondary | ICD-10-CM | POA: Diagnosis not present

## 2021-02-10 DIAGNOSIS — R194 Change in bowel habit: Secondary | ICD-10-CM | POA: Diagnosis not present

## 2021-02-10 DIAGNOSIS — R634 Abnormal weight loss: Secondary | ICD-10-CM | POA: Insufficient documentation

## 2021-02-10 DIAGNOSIS — Z887 Allergy status to serum and vaccine status: Secondary | ICD-10-CM | POA: Diagnosis not present

## 2021-02-10 HISTORY — PX: ESOPHAGOGASTRODUODENOSCOPY (EGD) WITH PROPOFOL: SHX5813

## 2021-02-10 HISTORY — PX: COLONOSCOPY WITH PROPOFOL: SHX5780

## 2021-02-10 HISTORY — DX: Other chronic pain: G89.29

## 2021-02-10 HISTORY — DX: Attention-deficit hyperactivity disorder, unspecified type: F90.9

## 2021-02-10 HISTORY — DX: Constipation, unspecified: K59.00

## 2021-02-10 LAB — POCT PREGNANCY, URINE: Preg Test, Ur: NEGATIVE

## 2021-02-10 SURGERY — ESOPHAGOGASTRODUODENOSCOPY (EGD) WITH PROPOFOL
Anesthesia: General

## 2021-02-10 MED ORDER — GLYCOPYRROLATE 0.2 MG/ML IJ SOLN
INTRAMUSCULAR | Status: DC | PRN
  Administered 2021-02-10: .1 mg via INTRAVENOUS

## 2021-02-10 MED ORDER — FENTANYL CITRATE (PF) 100 MCG/2ML IJ SOLN
INTRAMUSCULAR | Status: AC
  Filled 2021-02-10: qty 2

## 2021-02-10 MED ORDER — SODIUM CHLORIDE 0.9 % IV SOLN: INTRAVENOUS | Status: DC

## 2021-02-10 MED ORDER — PROPOFOL 500 MG/50ML IV EMUL
INTRAVENOUS | Status: AC
  Filled 2021-02-10: qty 50

## 2021-02-10 MED ORDER — PROPOFOL 10 MG/ML IV BOLUS
INTRAVENOUS | Status: DC | PRN
  Administered 2021-02-10: 100 mg via INTRAVENOUS

## 2021-02-10 MED ORDER — PROPOFOL 500 MG/50ML IV EMUL
INTRAVENOUS | Status: DC | PRN
  Administered 2021-02-10: 150 ug/kg/min via INTRAVENOUS

## 2021-02-10 MED ORDER — FENTANYL CITRATE (PF) 100 MCG/2ML IJ SOLN
INTRAMUSCULAR | Status: DC | PRN
  Administered 2021-02-10: 50 ug via INTRAVENOUS

## 2021-02-10 MED ORDER — PHENYLEPHRINE HCL (PRESSORS) 10 MG/ML IV SOLN
INTRAVENOUS | Status: DC | PRN
  Administered 2021-02-10 (×2): 100 ug via INTRAVENOUS

## 2021-02-10 NOTE — Op Note (Signed)
[facility] Patient Name: Bethany Little Procedure Date: 02/10/2021 11:03 AM MRN: [id number] Account #: [id number] Date of Birth: July 06, 1985 Admit Type: Outpatient Age: 35 Room: [facility] ENDO ROOM 1 Gender: Female Note Status: Finalized Instrument Name: Altamese Cabal Endoscope [id number] Procedure:             Upper GI endoscopy Indications:           Abdominal pain in the right lower quadrant, Nausea Providers:             Andrey Farmer MD, MD Referring MD:          Hattie Perch. Sherril Cong (Referring MD) Medicines:             Monitored Anesthesia Care Complications:         No immediate complications. Estimated blood loss:                         Minimal. Procedure:             Pre-Anesthesia Assessment:                        - Prior to the procedure, a History and Physical was                         performed, and patient medications and allergies were                         reviewed. The patient is competent. The risks and                         benefits of the procedure and the sedation options and                         risks were discussed with the patient. All questions                         were answered and informed consent was obtained.                         Patient identification and proposed procedure were                         verified by the physician, the nurse, the anesthetist                         and the technician in the endoscopy suite. Mental                         Status Examination: alert and oriented. Airway                         Examination: normal oropharyngeal airway and neck                         mobility. Respiratory Examination: clear to                         auscultation. CV Examination: normal. Prophylactic  Antibiotics: The patient does not require prophylactic                         antibiotics. Prior Anticoagulants: The patient has                         taken no previous anticoagulant  or antiplatelet                         agents. ASA Grade Assessment: II - A patient with mild                         systemic disease. After reviewing the risks and                         benefits, the patient was deemed in satisfactory                         condition to undergo the procedure. The anesthesia                         plan was to use monitored anesthesia care (MAC).                         Immediately prior to administration of medications,                         the patient was re-assessed for adequacy to receive                         sedatives. The heart rate, respiratory rate, oxygen                         saturations, blood pressure, adequacy of pulmonary                         ventilation, and response to care were monitored                         throughout the procedure. The physical status of the                         patient was re-assessed after the procedure.                        After obtaining informed consent, the endoscope was                         passed under direct vision. Throughout the procedure,                         the patient's blood pressure, pulse, and oxygen                         saturations were monitored continuously. The Endoscope                         was introduced through the mouth, and advanced to the  second part of duodenum. The upper GI endoscopy was                         accomplished without difficulty. The patient tolerated                         the procedure well. Findings:      The examined esophagus was normal.      Multiple dispersed erosions with no bleeding and no stigmata of recent       bleeding were found in the gastric antrum. Biopsies were taken with a       cold forceps for Helicobacter pylori testing. Estimated blood loss was       minimal.      The examined duodenum was normal. Biopsies for histology were taken with       a cold forceps for evaluation of celiac disease.  Estimated blood loss       was minimal. Impression:            - Normal esophagus.                        - Erosive gastropathy with no bleeding and no stigmata                         of recent bleeding. Biopsied.                        - Normal examined duodenum. Biopsied. Recommendation:        - Discharge patient to home.                        - Resume previous diet.                        - No ibuprofen, naproxen, or other non-steroidal                         anti-inflammatory drugs.                        - Use Prilosec (omeprazole) 20 mg PO daily for 4 weeks.                        - Await pathology results.                        - Perform a colonoscopy today. Procedure Code(s):     --- Professional ---                        [phone], Esophagogastroduodenoscopy, flexible,                         transoral; with biopsy, single or multiple Diagnosis Code(s):     --- Professional ---                        K31.89, Other diseases of stomach and duodenum                        R10.31, Right lower quadrant pain  R11.0, Nausea CPT copyright 2019 American Medical Association. All rights reserved. The codes documented in this report are preliminary and upon coder review may  be revised to meet current compliance requirements. Andrey Farmer MD, MD 02/10/2021 11:37:23 AM Number of Addenda: 0 Note Initiated On: 02/10/2021 11:03 AM Estimated Blood Loss:  Estimated blood loss was minimal.      [facility]

## 2021-02-10 NOTE — Anesthesia Preprocedure Evaluation (Addendum)
Anesthesia Evaluation  Patient identified by MRN, date of birth, ID band Patient awake    Reviewed: Allergy & Precautions, H&P , NPO status , Patient's Chart, lab work & pertinent test results, reviewed documented beta blocker date and time   History of Anesthesia Complications (+) PONV, PROLONGED EMERGENCE and history of anesthetic complications  Airway Mallampati: II  TM Distance: >3 FB Neck ROM: full    Dental  (+) Dental Advidsory Given, Teeth Intact   Pulmonary neg shortness of breath, sleep apnea , neg COPD, neg recent URI, Current Smoker and Patient abstained from smoking.,    Pulmonary exam normal breath sounds clear to auscultation       Cardiovascular Exercise Tolerance: Good hypertension, (-) angina(-) Past MI and (-) Cardiac Stents Normal cardiovascular exam+ dysrhythmias (palpitations) (-) Valvular Problems/Murmurs Rhythm:regular Rate:Normal     Neuro/Psych  Headaches, Seizures -,  PSYCHIATRIC DISORDERS Anxiety Depression    GI/Hepatic negative GI ROS, Neg liver ROS,   Endo/Other  negative endocrine ROS  Renal/GU negative Renal ROS  negative genitourinary   Musculoskeletal   Abdominal   Peds  Hematology negative hematology ROS (+)   Anesthesia Other Findings Past Medical History: No date: ADHD No date: Anxiety No date: Arthritis No date: Cervical abnormality 2003: Cervical cancer ([facility]) No date: Chronic pain No date: Constipation No date: Depression No date: Heartburn No date: Hypertension No date: Interstitial cystitis No date: Leukemia ([place])     Comment:  Remission for 23 yrs No date: Migraines No date: Seizures ([place]) No date: Sleep apnea No date: Vaginal Pap smear, abnormal   Reproductive/Obstetrics negative OB ROS                             Anesthesia Physical Anesthesia Plan  ASA: 2  Anesthesia Plan: General   Post-op Pain Management:    Induction:  Intravenous  PONV Risk Score and Plan: 2 and TIVA and Propofol infusion  Airway Management Planned: Natural Airway and Nasal Cannula  Additional Equipment:   Intra-op Plan:   Post-operative Plan:   Informed Consent: I have reviewed the patients History and Physical, chart, labs and discussed the procedure including the risks, benefits and alternatives for the proposed anesthesia with the patient or authorized representative who has indicated his/her understanding and acceptance.     Dental Advisory Given  Plan Discussed with: Anesthesiologist, CRNA and Surgeon  Anesthesia Plan Comments:        Anesthesia Quick Evaluation

## 2021-02-10 NOTE — Op Note (Signed)
[facility] Gastroenterology Patient Name: Bethany Little Procedure Date: 02/10/2021 11:02 AM MRN: [id number] Account #: [id number] Date of Birth: 1985/10/23 Admit Type: Outpatient Age: 35 Room: [facility] ENDO ROOM 1 Gender: Female Note Status: Finalized Instrument Name: Park Meo [id number] Procedure:             Colonoscopy Indications:           Abdominal pain in the right lower quadrant, Change in                         bowel habits, Weight loss Providers:             Andrey Farmer MD, MD Referring MD:          Hattie Perch. Sherril Cong (Referring MD) Medicines:             Monitored Anesthesia Care Complications:         No immediate complications. Estimated blood loss:                         Minimal. Procedure:             Pre-Anesthesia Assessment:                        - Prior to the procedure, a History and Physical was                         performed, and patient medications and allergies were                         reviewed. The patient is competent. The risks and                         benefits of the procedure and the sedation options and                         risks were discussed with the patient. All questions                         were answered and informed consent was obtained.                         Patient identification and proposed procedure were                         verified by the physician, the nurse, the anesthetist                         and the technician in the endoscopy suite. Mental                         Status Examination: alert and oriented. Airway                         Examination: normal oropharyngeal airway and neck                         mobility. Respiratory Examination: clear to  auscultation. CV Examination: normal. Prophylactic                         Antibiotics: The patient does not require prophylactic                         antibiotics. Prior Anticoagulants: The patient has                          taken no previous anticoagulant or antiplatelet                         agents. ASA Grade Assessment: II - A patient with mild                         systemic disease. After reviewing the risks and                         benefits, the patient was deemed in satisfactory                         condition to undergo the procedure. The anesthesia                         plan was to use monitored anesthesia care (MAC).                         Immediately prior to administration of medications,                         the patient was re-assessed for adequacy to receive                         sedatives. The heart rate, respiratory rate, oxygen                         saturations, blood pressure, adequacy of pulmonary                         ventilation, and response to care were monitored                         throughout the procedure. The physical status of the                         patient was re-assessed after the procedure.                        After obtaining informed consent, the colonoscope was                         passed under direct vision. Throughout the procedure,                         the patient's blood pressure, pulse, and oxygen                         saturations were monitored continuously. The  Colonoscope was introduced through the anus and                         advanced to the the terminal ileum. The colonoscopy                         was somewhat difficult due to poor bowel prep. The                         patient tolerated the procedure well. The quality of                         the bowel preparation was poor. Findings:      The perianal and digital rectal examinations were normal.      The terminal ileum appeared normal.      A large amount of semi-liquid stool was found in the entire colon,       precluding visualization.      Normal mucosa was found in the entire colon. Biopsies for histology were       taken with a cold  forceps from the entire colon for evaluation of       microscopic colitis. Estimated blood loss was minimal.      The exam was otherwise without abnormality on direct and retroflexion       views. Impression:            - Preparation of the colon was poor.                        - The examined portion of the ileum was normal.                        - Stool in the entire examined colon.                        - Normal mucosa in the entire examined colon. Biopsied.                        - The examination was otherwise normal on direct and                         retroflexion views. Recommendation:        - Discharge patient to home.                        - Resume previous diet.                        - Continue present medications.                        - Await pathology results.                        - Return to referring physician as previously                         scheduled. Procedure Code(s):     --- Professional ---  45380, Colonoscopy, flexible; with biopsy, single or                         multiple Diagnosis Code(s):     --- Professional ---                        R10.31, Right lower quadrant pain                        R19.4, Change in bowel habit                        R63.4, Abnormal weight loss CPT copyright 2019 American Medical Association. All rights reserved. The codes documented in this report are preliminary and upon coder review may  be revised to meet current compliance requirements. Andrey Farmer MD, MD 02/10/2021 11:44:11 AM Number of Addenda: 0 Note Initiated On: 02/10/2021 11:02 AM Scope Withdrawal Time: 0 hours 3 minutes 6 seconds  Total Procedure Duration: 0 hours 9 minutes 7 seconds  Estimated Blood Loss:  Estimated blood loss was minimal.      [facility]

## 2021-02-10 NOTE — Anesthesia Postprocedure Evaluation (Signed)
Anesthesia Post Note  Patient: Bethany Little  Procedure(s) Performed: ESOPHAGOGASTRODUODENOSCOPY (EGD) WITH PROPOFOL COLONOSCOPY WITH PROPOFOL  Patient location during evaluation: Endoscopy Anesthesia Type: General Level of consciousness: awake and alert Pain management: pain level controlled Vital Signs Assessment: post-procedure vital signs reviewed and stable Respiratory status: spontaneous breathing, nonlabored ventilation, respiratory function stable and patient connected to nasal cannula oxygen Cardiovascular status: blood pressure returned to baseline and stable Postop Assessment: no apparent nausea or vomiting Anesthetic complications: no   No notable events documented.   Last Vitals:  Vitals:   02/10/21 1215 02/10/21 1225  BP: (!) 84/50 (!) 91/54  Pulse: 83 81  Resp: 18 16  Temp:    SpO2: 100% 99%    Last Pain:  Vitals:   02/10/21 1225  TempSrc:   PainSc: 0-No pain                 Martha Clan

## 2021-02-10 NOTE — Transfer of Care (Signed)
Immediate Anesthesia Transfer of Care Note  Patient: Bethany Little  Procedure(s) Performed: ESOPHAGOGASTRODUODENOSCOPY (EGD) WITH PROPOFOL COLONOSCOPY WITH PROPOFOL  Patient Location: PACU and Endoscopy Unit  Anesthesia Type:General  Level of Consciousness: drowsy  Airway & Oxygen Therapy: Patient Spontanous Breathing  Post-op Assessment: Report given to RN and Post -op Vital signs reviewed and stable  Post vital signs: stable  Last Vitals:  Vitals Value Taken Time  BP    Temp    Pulse 82 02/10/21 1138  Resp 17 02/10/21 1138  SpO2 100 % 02/10/21 1138  Vitals shown include unvalidated device data.  Last Pain:  Vitals:   02/10/21 1130  TempSrc: Temporal  PainSc:          Complications: No notable events documented.

## 2021-02-10 NOTE — H&P (Signed)
Outpatient short stay form Pre-procedure 02/10/2021  Bethany Rubenstein, MD  Primary Physician: Frazier Richards, MD  Reason for visit:  Nausea/Vomiting/Weight loss  History of present illness:   35 y/o lady with chronic GI issues including nausea/vomiting, abdominal pain, loose stool, constipation, and weight loss. Apparently had EGD/Colonoscopy several years ago but no records currently. Was worked up at [facility] and diagnosed with pelvic floor dysnergia. Family history of celiac disease. History of appendectomy and c-section. No blood thinners. She smokes marijuana regularly and takes NSAIDS,    Current Facility-Administered Medications:    0.9 %  sodium chloride infusion, , Intravenous, Continuous, Wajiha Versteeg, Hilton Cork, MD, Last Rate: 20 mL/hr at 02/10/21 1052, New Bag at 02/10/21 1052  Medications Prior to Admission  Medication Sig Dispense Refill Last Dose   BIOTIN PO Take by mouth.   02/09/2021   cyclobenzaprine (FLEXERIL) 10 MG tablet Take 10 mg by mouth 3 (three) times daily as needed for muscle spasms.   Past Month   diphenhydrAMINE (BENADRYL) 25 mg capsule Take 25 mg by mouth.   02/09/2021   docusate sodium (COLACE) 100 MG capsule Take 1 capsule (100 mg total) by mouth 2 (two) times daily. 30 capsule 0 Past Week   EMGALITY 120 MG/ML SOAJ INJECT 120 MG (1 SYRINGE) INTO THE SKIN EVERY 28 DAYS. 1 mL 5 Past Month   ibuprofen (ADVIL) 800 MG tablet TAKE 1 TABLET BY MOUTH EVERY 8 HOURS AS NEEDED 60 tablet 1 Past Week   lamoTRIgine (LAMICTAL) 200 MG tablet Take 1 tablet (200 mg total) by mouth 2 (two) times daily. 60 tablet 11 02/09/2021   ondansetron (ZOFRAN-ODT) 4 MG disintegrating tablet DISSOLVE 1 TABLET UNDER THE TONGUE EVERY 8 HOURS AS NEEDED FOR NAUSEA OR VOMITING 30 tablet 5 02/09/2021   pantoprazole (PROTONIX) 40 MG tablet Take 40 mg by mouth daily.   Past Month   promethazine (PHENERGAN) 25 MG tablet TAKE 1 TABLET BY MOUTH EVERY 6 HOURS AS NEEDED FOR NAUSEA OR VOMITING 30 tablet 1 Past  Week   SUMAtriptan (TOSYMRA) 10 MG/ACT SOLN 1 spray in one nostril.  May repeat every 1 hour.  Maximum 3 sprays in 24 hours. 6 each 5 Past Month   traMADol (ULTRAM) 50 MG tablet Take 1 tablet (50 mg total) by mouth every 6 (six) hours as needed. 12 tablet 0 Past Week   venlafaxine XR (EFFEXOR-XR) 37.5 MG 24 hr capsule Take 37.5 mg by mouth in the morning and at bedtime. (Patient not taking: Reported on 02/10/2021)   Not Taking     Allergies  Allergen Reactions   Tetracycline Hcl Hives    Childhood allergy   Erythromycin Hives    Childhood allergy   Pertussis Vaccines Hives    Childhood allergy   Reglan [Metoclopramide] Other (See Comments)    Muscle Spasms   Tetracyclines & Related Hives    Childhood allergy   Latex Itching and Rash     Past Medical History:  Diagnosis Date   ADHD    Anxiety    Arthritis    Cervical abnormality    Cervical cancer ([place]) 2003   Chronic pain    Constipation    Depression    Heartburn    Hypertension    Interstitial cystitis    Leukemia ([place])    Remission for 23 yrs   Migraines    Seizures (HCC)    Sleep apnea    Vaginal Pap smear, abnormal     Review of systems:  Otherwise negative.    Physical Exam  Gen: Alert, oriented. Appears stated age.  HEENT: PERRLA. Lungs: No respiratory distress CV: RRR Abd: soft, benign, no masses Ext: No edema   Planned procedures: Proceed with EGD/colonoscopy. The patient understands the nature of the planned procedure, indications, risks, alternatives and potential complications including but not limited to bleeding, infection, perforation, damage to internal organs and possible oversedation/side effects from anesthesia. The patient agrees and gives consent to proceed.  Please refer to procedure notes for findings, recommendations and patient disposition/instructions.     Bethany Rubenstein, MD [facility] Gastroenterology

## 2021-02-10 NOTE — Interval H&P Note (Signed)
History and Physical Interval Note:  02/10/2021 11:04 AM  Bethany Little  has presented today for surgery, with the diagnosis of Weight loss r63.4, nausea r11.0, change in bowel habits r19.4, chronic rlq pain r10.31, g89.29.  The various methods of treatment have been discussed with the patient and family. After consideration of risks, benefits and other options for treatment, the patient has consented to  Procedure(s): ESOPHAGOGASTRODUODENOSCOPY (EGD) WITH PROPOFOL (N/A) COLONOSCOPY WITH PROPOFOL (N/A) as a surgical intervention.  The patient's history has been reviewed, patient examined, no change in status, stable for surgery.  I have reviewed the patient's chart and labs.  Questions were answered to the patient's satisfaction.     Lesly Rubenstein  Ok to proceed with EGD/Colonoscopy

## 2021-02-11 ENCOUNTER — Encounter: Payer: Self-pay | Admitting: Gastroenterology

## 2021-02-12 LAB — SURGICAL PATHOLOGY

## 2021-02-23 NOTE — Progress Notes (Signed)
NEUROLOGY FOLLOW UP OFFICE NOTE  FLO BERROA [id number]  Assessment/Plan:   Migraine without aura, without status migrainosus, not intractable - worse due to multiple stressors.  At this time, I wouldn't make any changes to migraine management as this is clearly triggered by multiple stressors. Migraine prevention:  Emgality Migraine rescue:  Refilled sumatriptan 6mg .  She will try Nurtec as well Limit use of pain relievers to no more than 2 days out of week to prevent risk of rebound or medication-overuse headache. Keep headache diary Follow up 6 months.   Subjective:  Bethany Little is a 35 year old right-handed woman with hypertension, chronic pain, depression, tobacco use disorder and history of pseudoseizure, cervical cancer and leukemia who follows up for migraines.  She is accompanied by her spouse who supplements history.   UPDATE: Migraines have been well-controlled on Emgality up until last month.  Migraines were moderate, lasting less than a day with sumatriptan 6mg , and averaging 5 a month.  However, she has had multiple significant stressors in March 09, 2023.  Her father passed away, her son was in the hospital, she is dealing with GI issues (such as gastric erosion) and she has been having problems with her mother who has psychiatric comorbidities.  Therefore they have been been daily.  She was unable to tolerate Tosymra.  Current NSAIDS: ibuprofen Current analgesics: none Current triptans:  sumatriptan 6mg  [place] Current ergotamine:  None Current anti-emetic: Zofran 4 mg Current muscle relaxants:  Flexeril 10mg  Current anti-anxiolytic:  None Current sleep aide: none Current Antihypertensive medications: none Current Antidepressant medications:  None.  She avoids antidepressants due to side effects to multiple antidepressants. Current Anticonvulsant medications:  lamotrigine 150mg  twice daily (for mood) Current anti-CGRP:  Emgality Current  Vitamins/Herbal/Supplements: B12 Current Antihistamines/Decongestants:  Benadryl Other therapy:  None Hormone/birth control: No   Caffeine:  No coffee.  Diet:  Drinks about 40 oz water daily.  Poor appetite due to nausea and vomiting. Exercise:  No Depression:  yes; Anxiety:  yes.     Other pain:  Diffuse pain Sleep hygiene:  Poor due to headaches.  Tired in the morning but more energy by the afternoon but fatigued again in evening.  Melatonin not working.  Medicaid said they would not cover her sleep study.   HISTORY:  Onset:  She has prior history of episodic migraines but they became frequent and intractable in June 2019.  No known preceding event.  Started taking pain relievers and headaches gradually got worse. Location:  Right sided (frontal/periorbital and temple)  Quality:  Throbbing Initial intensity:  Severe  It may wake her up at night.  She denies new headache, thunderclap headache  Aura:  No Prodrome:  No Postdrome:  fatigue Associated symptoms: Nausea, vomiting, photophobia, phonophobia, blurred vision (particularly in right eye, eye lacrimation, right frontal swelling.  The pain causes her to feel confused and irritable.  She denies associated unilateral numbness or weakness. Initial duration:  Constant back round headache.  Flare-ups last several hours (over 4 hours) Initial Frequency: Daily Initial Frequency of abortive medication: Takes Fioricet 4-5 days a week.   Triggers: Unknown Exacerbating factors: Closing her eyes, light, sound Relieving factors: Rest Activity:  aggravates   MRI of brain without contrast from 06/17/18 was normal.  She reported difficulty pronouncing words and finishing sentences.  She also reports difficulty with short term memory.  She underwent neuropsychological evaluation with Dr. 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   Past NSAIDS:  Naproxen,  ibuprofen Past analgesics:  Excedrin, Tylenol, Fioricet Past abortive triptans:  Maxalt, sumatriptan 100mg , Tosymra NS (burns) Past abortive ergotamine:  None Past muscle relaxants:  None Past anti-emetic:  None Past antihypertensive medications:  None Past antidepressant medications:  Nortriptyline (irritability); venlafaxine (fatigue, felt sick) Past anticonvulsant medications:  topiramate 100mg  BID Past anti-CGRP:  Melodie Bouillon 100mg  Past vitamins/Herbal/Supplements:  None Past antihistamines/decongestants:  None Other past therapies:  Migraine cocktails (decreased intensity but not abort)   Family history of headache:  On mother's side.  PAST MEDICAL HISTORY: Past Medical History:  Diagnosis Date   ADHD    Anxiety    Arthritis    Cervical abnormality    Cervical cancer ([place]) 2003   Chronic pain    Constipation    Depression    Heartburn    Hypertension    Interstitial cystitis    Leukemia ([place])    Remission for 23 yrs   Migraines    Seizures (HCC)    Sleep apnea    Vaginal Pap smear, abnormal     MEDICATIONS: Current Outpatient Medications on File Prior to Visit  Medication Sig Dispense Refill   BIOTIN PO Take by mouth.     cyclobenzaprine (FLEXERIL) 10 MG tablet Take 10 mg by mouth 3 (three) times daily as needed for muscle spasms.     diphenhydrAMINE (BENADRYL) 25 mg capsule Take 25 mg by mouth.     docusate sodium (COLACE) 100 MG capsule Take 1 capsule (100 mg total) by mouth 2 (two) times daily. 30 capsule 0   EMGALITY 120 MG/ML SOAJ INJECT 120 MG (1 SYRINGE) INTO THE SKIN EVERY 28 DAYS. 1 mL 5   ibuprofen (ADVIL) 800 MG tablet TAKE 1 TABLET BY MOUTH EVERY 8 HOURS AS NEEDED 60 tablet 1   lamoTRIgine (LAMICTAL) 200 MG tablet Take 1 tablet (200 mg total) by mouth 2 (two) times daily. 60 tablet 11   ondansetron (ZOFRAN-ODT) 4 MG disintegrating tablet DISSOLVE 1 TABLET UNDER THE TONGUE EVERY 8 HOURS AS NEEDED FOR NAUSEA OR VOMITING 30 tablet 5   pantoprazole  (PROTONIX) 40 MG tablet Take 40 mg by mouth daily.     promethazine (PHENERGAN) 25 MG tablet TAKE 1 TABLET BY MOUTH EVERY 6 HOURS AS NEEDED FOR NAUSEA OR VOMITING 30 tablet 1   SUMAtriptan (TOSYMRA) 10 MG/ACT SOLN 1 spray in one nostril.  May repeat every 1 hour.  Maximum 3 sprays in 24 hours. 6 each 5   traMADol (ULTRAM) 50 MG tablet Take 1 tablet (50 mg total) by mouth every 6 (six) hours as needed. 12 tablet 0   venlafaxine XR (EFFEXOR-XR) 37.5 MG 24 hr capsule Take 37.5 mg by mouth in the morning and at bedtime. (Patient not taking: Reported on 02/10/2021)     No current facility-administered medications on file prior to visit.    ALLERGIES: Allergies  Allergen Reactions   Tetracycline Hcl Hives    Childhood allergy   Erythromycin Hives    Childhood allergy   Pertussis Vaccines Hives    Childhood allergy   Reglan [Metoclopramide] Other (See Comments)    Muscle Spasms   Tetracyclines & Related Hives    Childhood allergy   Latex Itching and Rash    FAMILY HISTORY: Family History  Problem Relation Age of Onset   Depression Mother    Bipolar disorder Mother    COPD Mother    Hypertension Mother    Mental illness Mother  Kidney disease Father        partial kidney fx   COPD Father    Pancreatic cancer Paternal Grandmother    Celiac disease Paternal Grandmother    Hypertension Paternal Grandmother    Heart failure Paternal Grandfather    Prostate cancer Other    Healthy Son    Bladder Cancer Neg Hx       Objective:  Blood pressure (!) 141/86, pulse 98, height 5\' 4"  (1.626 m), weight 167 lb 6.4 oz (75.9 kg), SpO2 98 %. General: No acute distress.  Patient appears well-groomed.     Metta Clines, DO  CC: Beverlyn Roux, MD

## 2021-02-24 ENCOUNTER — Encounter: Payer: Self-pay | Admitting: Neurology

## 2021-02-24 ENCOUNTER — Other Ambulatory Visit: Payer: Self-pay

## 2021-02-24 ENCOUNTER — Ambulatory Visit (INDEPENDENT_AMBULATORY_CARE_PROVIDER_SITE_OTHER): Payer: Medicaid Other | Admitting: Neurology

## 2021-02-24 VITALS — BP 141/86 | HR 98 | Ht 64.0 in | Wt 167.4 lb

## 2021-02-24 DIAGNOSIS — G43009 Migraine without aura, not intractable, without status migrainosus: Secondary | ICD-10-CM | POA: Diagnosis not present

## 2021-02-24 MED ORDER — SUMATRIPTAN SUCCINATE 6 MG/0.5ML ~~LOC~~ SOAJ: SUBCUTANEOUS | 5 refills | Status: DC

## 2021-02-24 NOTE — Patient Instructions (Signed)
Continue Emgality Refilled sumatriptan shot.  However try Nurtec (1 tablet daily as needed for migraine). If effective, contact me

## 2021-02-26 ENCOUNTER — Telehealth: Payer: Self-pay

## 2021-02-26 NOTE — Telephone Encounter (Signed)
New message    Yizel Doverspike KeyKathie Dike - PA Case ID: [id number] - Rx #: [id number] Need help? Call us at [phone]  Outcome Approved today  PA Case: [id number], Status: Approved, Coverage Starts on: 02/26/2021 12:00:00 AM, Coverage Ends on: 02/26/2022 12:00:00 AM. Drug Imitrex STATdose System 6MG /0.5ML auto-injectors Form [employer] [facility] Electronic [place] Form [phone] NCPDP) [place]

## 2021-02-27 ENCOUNTER — Other Ambulatory Visit: Payer: Self-pay | Admitting: Neurology

## 2021-03-16 ENCOUNTER — Telehealth: Payer: Self-pay

## 2021-03-16 NOTE — Telephone Encounter (Signed)
New message   [facility] Key: [id number] - PA Case ID: [id number] Need help? Call us at [phone]  Outcome Approved today  PA Case: [id number], Status: Approved, Coverage Starts on: 03/16/2021 12:00:00 AM, Coverage Ends on: 03/16/2022 12:00:00 AM.  Drug Emgality 120MG /ML auto-injectors (migraine)  Form [employer] [facility] Electronic [place] Form [phone] NCPDP)    This request has received a Favorable outcome.  Please note any additional information provided by [employer] [facility] at the bottom of this request

## 2021-03-24 ENCOUNTER — Other Ambulatory Visit: Payer: Self-pay | Admitting: Adult Health

## 2021-04-07 ENCOUNTER — Other Ambulatory Visit: Payer: Self-pay | Admitting: Neurology

## 2021-04-08 ENCOUNTER — Other Ambulatory Visit: Payer: Self-pay | Admitting: Neurology

## 2021-04-10 ENCOUNTER — Encounter (HOSPITAL_COMMUNITY): Payer: Self-pay | Admitting: *Deleted

## 2021-04-10 ENCOUNTER — Emergency Department (HOSPITAL_COMMUNITY): Payer: Medicaid Other

## 2021-04-10 ENCOUNTER — Other Ambulatory Visit: Payer: Self-pay

## 2021-04-10 ENCOUNTER — Emergency Department (HOSPITAL_COMMUNITY)
Admission: EM | Admit: 2021-04-10 | Discharge: 2021-04-10 | Disposition: A | Discharge status: Home / Self Care | Payer: Medicaid Other | Attending: Emergency Medicine | Admitting: Emergency Medicine

## 2021-04-10 DIAGNOSIS — Z8541 Personal history of malignant neoplasm of cervix uteri: Secondary | ICD-10-CM | POA: Insufficient documentation

## 2021-04-10 DIAGNOSIS — F17210 Nicotine dependence, cigarettes, uncomplicated: Secondary | ICD-10-CM | POA: Diagnosis not present

## 2021-04-10 DIAGNOSIS — R0781 Pleurodynia: Secondary | ICD-10-CM

## 2021-04-10 DIAGNOSIS — W01198A Fall on same level from slipping, tripping and stumbling with subsequent striking against other object, initial encounter: Secondary | ICD-10-CM | POA: Insufficient documentation

## 2021-04-10 DIAGNOSIS — S20212A Contusion of left front wall of thorax, initial encounter: Secondary | ICD-10-CM | POA: Diagnosis not present

## 2021-04-10 DIAGNOSIS — I10 Essential (primary) hypertension: Secondary | ICD-10-CM | POA: Insufficient documentation

## 2021-04-10 DIAGNOSIS — Z91040 Latex allergy status: Secondary | ICD-10-CM | POA: Insufficient documentation

## 2021-04-10 DIAGNOSIS — S299XXA Unspecified injury of thorax, initial encounter: Secondary | ICD-10-CM | POA: Diagnosis present

## 2021-04-10 DIAGNOSIS — Y9285 Railroad track as the place of occurrence of the external cause: Secondary | ICD-10-CM | POA: Diagnosis not present

## 2021-04-10 MED ORDER — KETOROLAC TROMETHAMINE 30 MG/ML IJ SOLN
30.0000 mg | Freq: Once | INTRAMUSCULAR | Status: AC
  Administered 2021-04-10: 30 mg via INTRAMUSCULAR
  Filled 2021-04-10: qty 1

## 2021-04-10 MED ORDER — OXYCODONE HCL 5 MG PO TABS: 5.0000 mg | ORAL_TABLET | Freq: Once | ORAL | Status: DC

## 2021-04-10 MED ORDER — MELOXICAM 15 MG PO TABS: 15.0000 mg | ORAL_TABLET | Freq: Every day | ORAL | 0 refills | Status: AC

## 2021-04-10 NOTE — ED Provider Notes (Signed)
[facility] EMERGENCY DEPARTMENT Provider Note   CSN: [id number] Arrival date & time: 04/10/21  1225     History Chief Complaint  Patient presents with   Rib Injury    Bethany Little is a 35 y.o. female who presents to the emergency department with 2 days of left-sided rib pain after fall.  Patient states that she fell over a railroad tie and hit the left side of her ribs on Wednesday night.  She continues to have pain, worse with breathing and coughing.  She has been taking ibuprofen with minimal relief at home.   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   Migraines    Seizures ([place])    Sleep apnea    Vaginal Pap smear, abnormal     Patient Active Problem List   Diagnosis Date Noted   LLQ pain 02/26/2020   Low back pain 02/26/2020   Encounter for gynecological examination with Papanicolaou smear of cervix 02/26/2020   Right upper quadrant abdominal tenderness without rebound tenderness 02/11/2020   RLQ abdominal pain 02/11/2020   Right flank pain 02/11/2020   Hematuria 02/11/2020   Lower abdominal pain 02/05/2020   Constipation 02/05/2020   Acute appendicitis 01/27/2020   Bacterial vaginosis 01/27/2020   Chronic intractable headache 05/31/2018   Loss of memory 04/26/2018   Mood change 04/26/2018   Seizure ([place]) 03/22/2016   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    Past Surgical History:  Procedure Laterality Date   anterior cervical adenopathy     CARDIAC CATHETERIZATION     43 weeks old   CESAREAN SECTION     COLONOSCOPY WITH PROPOFOL N/A 02/10/2021   Procedure: COLONOSCOPY WITH PROPOFOL;  Surgeon: Lesly Rubenstein, MD;  Location: [facility] ENDOSCOPY;  Service: Endoscopy;  Laterality: N/A;   ESOPHAGOGASTRODUODENOSCOPY (EGD) WITH PROPOFOL N/A 02/10/2021   Procedure: ESOPHAGOGASTRODUODENOSCOPY (EGD) WITH PROPOFOL;  Surgeon: Lesly Rubenstein, MD;  Location: [facility] ENDOSCOPY;  Service: Endoscopy;  Laterality: N/A;   LAPAROSCOPIC APPENDECTOMY N/A 01/28/2020   Procedure: APPENDECTOMY LAPAROSCOPIC;  Surgeon: Virl Cagey, MD;  Location: [facility];  Service: General;  Laterality: N/A;   LEEP     TUBAL LIGATION       OB History     Gravida  2   Para  1   Term  1   Preterm      AB  1   Living  1      SAB      IAB      Ectopic      Multiple      Live Births  1           Family History  Problem Relation Age of Onset   Depression Mother  Bipolar disorder Mother    COPD Mother    Hypertension Mother    Mental illness Mother    Kidney disease Father        partial kidney fx   COPD Father    Pancreatic cancer Paternal Grandmother    Celiac disease Paternal Grandmother    Hypertension Paternal Grandmother    Heart failure Paternal Grandfather    Prostate cancer Other    Healthy Son    Bladder Cancer Neg Hx     Social History   Tobacco Use   Smoking status: Some Days    Packs/day: 1.00    Years: 10.00    Pack years: 10.00    Types: Cigarettes   Smokeless tobacco: Never  Vaping Use   Vaping Use: Never used  Substance Use Topics   Alcohol use: Not Currently   Drug use: Yes    Types: Marijuana    Comment: daily in evening    Home Medications Prior to Admission medications   Medication Sig Start Date End Date Taking?  Authorizing Provider  meloxicam (MOBIC) 15 MG tablet Take 1 tablet (15 mg total) by mouth daily. 04/10/21 05/10/21 Yes Briea Mcenery T, PA-C  BIOTIN PO Take by mouth.    [provider]  cyclobenzaprine (FLEXERIL) 10 MG tablet Take 10 mg by mouth 3 (three) times daily as needed for muscle spasms. Patient not taking: Reported on 02/24/2021    [provider]  diphenhydrAMINE (BENADRYL) 25 mg capsule Take 25 mg by mouth.    [provider]  docusate sodium (COLACE) 100 MG capsule Take 1 capsule (100 mg total) by mouth 2 (two) times daily. Patient not taking: Reported on 02/24/2021 02/05/20   Virl Cagey, MD  EMGALITY 120 MG/ML SOAJ INJECT 120 MG (1 SYRINGE) INTO THE SKIN EVERY 28 DAYS. 02/27/21   Tomi Likens, Adam R, DO  ibuprofen (ADVIL) 800 MG tablet TAKE 1 TABLET BY MOUTH EVERY 8 HOURS AS NEEDED 03/24/21   Estill Dooms, NP  lamoTRIgine (LAMICTAL) 200 MG tablet Take 1 tablet (200 mg total) by mouth 2 (two) times daily. 05/27/20   Cameron Sprang, MD  ondansetron (ZOFRAN-ODT) 4 MG disintegrating tablet DISSOLVE 1 TABLET UNDER THE TONGUE EVERY 8 HOURS AS NEEDED FOR NAUSEA OR VOMITING 04/08/21   Tomi Likens, Adam R, DO  pantoprazole (PROTONIX) 40 MG tablet Take 40 mg by mouth daily.    [provider]  promethazine (PHENERGAN) 25 MG tablet TAKE 1 TABLET BY MOUTH EVERY 6 HOURS AS NEEDED FOR NAUSEA OR VOMITING 03/24/21   Derrek Monaco A, NP  SUMAtriptan (TOSYMRA) 10 MG/ACT SOLN 1 spray in one nostril.  May repeat every 1 hour.  Maximum 3 sprays in 24 hours. Patient not taking: Reported on 02/24/2021 08/25/20   Pieter Partridge, DO  SUMAtriptan 6 MG/0.5ML SOAJ INJECT 0.5 ML INTO THE SKIN EVERY 2 HOURS AS NEEDED FOR MIGRAINE OR HEADACHEMAY REPEAT IN 2 HOURS IF HEADACHE PERSISTS OR RECURS 02/24/21   Pieter Partridge, DO  traMADol (ULTRAM) 50 MG tablet Take 1 tablet (50 mg total) by mouth every 6 (six) hours as needed. 10/02/20   Triplett, Johnette Abraham B, FNP  venlafaxine XR (EFFEXOR-XR)  37.5 MG 24 hr capsule Take 37.5 mg by mouth in the morning and at bedtime. Patient not taking: No sig reported    [provider]    Allergies    Tetracycline hcl, Erythromycin, Pertussis vaccines, Reglan [metoclopramide], Tetracyclines & related, and Latex  Review of Systems  Review of Systems  Constitutional:  Negative for chills and fever.  Respiratory:  Positive for shortness of breath. Negative for cough.   Cardiovascular:  Positive for chest pain.  Gastrointestinal:  Negative for abdominal pain.  All other systems reviewed and are negative.  Physical Exam Updated Vital Signs BP 119/83 (BP Location: Left Arm)   Pulse 82   Temp 98 F (36.7 C)   Resp 18   Ht 5\' 4"  (1.626 m)   Wt 72.6 kg   LMP 03/27/2021   SpO2 99%   BMI 27.46 kg/m   Physical Exam Vitals and nursing note reviewed.  Constitutional:      Appearance: Normal appearance.  HENT:     Head: Normocephalic and atraumatic.  Eyes:     Conjunctiva/sclera: Conjunctivae normal.  Cardiovascular:     Rate and Rhythm: Normal rate and regular rhythm.  Pulmonary:     Effort: Pulmonary effort is normal. No respiratory distress.     Breath sounds: Normal breath sounds.  Chest:     Comments: Reproducible chest wall pain to palpation of the left anterior chest with mild bruising Abdominal:     General: There is no distension.     Palpations: Abdomen is soft.     Tenderness: There is no abdominal tenderness.  Skin:    General: Skin is warm and dry.  Neurological:     General: No focal deficit present.     Mental Status: She is alert.    ED Results / Procedures / Treatments   Labs (all labs ordered are listed, but only abnormal results are displayed) Labs Reviewed - No data to display  EKG None  Radiology DG Ribs Unilateral W/Chest Left  Result Date: 04/10/2021 CLINICAL DATA:  Left rib pain after fall several days ago. EXAM: LEFT RIBS AND CHEST - 3+ VIEW COMPARISON:  July 01, 2014. FINDINGS: No  fracture or other bone lesions are seen involving the ribs. There is no evidence of pneumothorax or pleural effusion. Both lungs are clear. Heart size and mediastinal contours are within normal limits. IMPRESSION: Negative. Electronically Signed   By: Marijo Conception M.D.   On: 04/10/2021 13:57    Procedures Procedures   Medications Ordered in ED Medications  ketorolac (TORADOL) 30 MG/ML injection 30 mg (30 mg Intramuscular Given 04/10/21 1643)    ED Course  I have reviewed the triage vital signs and the nursing notes.  Pertinent labs & imaging results that were available during my care of the patient were reviewed by me and considered in my medical decision making (see chart for details).    MDM Rules/Calculators/A&P                           Patient is 35 year old female presents to the emergency department with 2 days of left-sided rib pain after a fall.  Patient states that she fell over a railroad tie and directly hit the left side of her chest.  She continues to have pain worse with deep breathing and coughing.  On exam patient is afebrile, not tachycardic, not hypoxic, no acute distress.  Chest x-ray was negative for acute fracture.  There is reproducible chest wall pain to palpation of the left anterior chest with mild bruising, no focal tenderness.  Patient given Toradol and reported mild improvement in symptoms.  She is otherwise hemodynamically stable not requiring admission or inpatient treatment for her symptoms at this time.  Plan to treat symptomatically.  Discussed reasons to return to the emergency department.  Patient agreeable to plan.  Final Clinical Impression(s) / ED Diagnoses Final diagnoses:  Rib pain    Rx / DC Orders ED Discharge Orders          Ordered    meloxicam (MOBIC) 15 MG tablet  Daily        04/10/21 1651             Luisdaniel Kenton T, PA-C 04/10/21 1657    Wyvonnia Dusky, MD 04/11/21 0021

## 2021-04-10 NOTE — Discharge Instructions (Addendum)
You are seen in the emergency department today for rib pain.  We discussed your x-ray showed no fractures.  We gave you an injectable pain/inflammatory medicine.  I am prescribing you a medicine called meloxicam, it is a stronger anti-inflammatory that you can take in place of ibuprofen.  You can take this once daily for pain.  Continue to monitor how you're doing and return to the ER for new or worsening symptoms such as worsening pain or difficulty breathing not related to pain.   It has been a pleasure seeing and caring for you today and I hope you start feeling better soon!

## 2021-04-10 NOTE — ED Triage Notes (Signed)
Pt fell over a railroad tie hitting her left side of her ribs on Wednesday night.  Pain to left ribs since.  Hurts to take deep breath.

## 2021-05-26 ENCOUNTER — Other Ambulatory Visit: Payer: Self-pay | Admitting: Adult Health

## 2021-06-29 ENCOUNTER — Other Ambulatory Visit: Payer: Self-pay | Admitting: Neurology

## 2021-06-30 ENCOUNTER — Other Ambulatory Visit: Payer: Self-pay | Admitting: Neurology

## 2021-06-30 NOTE — Telephone Encounter (Signed)
Patient called and stated that her pharmacy has not received a refill request for Lamictal.  Her pharmacy is [place].  She stated she has been without her medicine for 2 days.

## 2021-07-01 MED ORDER — LAMOTRIGINE 200 MG PO TABS: 200.0000 mg | ORAL_TABLET | Freq: Two times a day (BID) | ORAL | 1 refills | Status: DC

## 2021-07-01 NOTE — Telephone Encounter (Signed)
Ok to send in refills, she will need to schedule f/u with me. She sees Dr. 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 Ok to send refills until her next f/u with me, thanks

## 2021-07-01 NOTE — Telephone Encounter (Signed)
I sent in refills until her f/u in July, thanks

## 2021-07-23 ENCOUNTER — Other Ambulatory Visit: Payer: Self-pay | Admitting: Adult Health

## 2021-08-10 ENCOUNTER — Ambulatory Visit: Payer: Medicaid Other | Admitting: Adult Health

## 2021-08-12 ENCOUNTER — Encounter: Payer: Self-pay | Admitting: Neurology

## 2021-08-12 ENCOUNTER — Other Ambulatory Visit: Payer: Self-pay

## 2021-08-12 ENCOUNTER — Ambulatory Visit: Payer: Medicaid Other | Admitting: Neurology

## 2021-08-12 VITALS — BP 148/94 | HR 99 | Ht 64.0 in | Wt 165.6 lb

## 2021-08-12 DIAGNOSIS — F445 Conversion disorder with seizures or convulsions: Secondary | ICD-10-CM | POA: Diagnosis not present

## 2021-08-12 MED ORDER — LAMOTRIGINE 200 MG PO TABS: 200.0000 mg | ORAL_TABLET | Freq: Two times a day (BID) | ORAL | 3 refills | Status: DC

## 2021-08-12 NOTE — Patient Instructions (Signed)
Good to see you. Refills have been sent for your Lamotrigine '200mg'$  twice a day prescription. Once able, I strongly recommend starting Cognitive Behavioral Therapy. Continue follow-up with Dr. Tomi Likens for migraines. Follow-up with me in 1 year, call for any changes. ?
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
## 2021-08-20 ENCOUNTER — Telehealth: Payer: Self-pay

## 2021-08-20 ENCOUNTER — Other Ambulatory Visit (HOSPITAL_COMMUNITY): Payer: Self-pay

## 2021-08-20 NOTE — Telephone Encounter (Signed)
Patient Advocate Encounter ?  ?Received notification from [facility] that prior authorization for Tosymra '10mg'$  nasal spray is required by his/her insurance [employer]. ?  ?PA submitted on 08/20/21 ? ?Key#: [id number] ? ?Status is pending ?   ?[facility] will continue to follow: ? ?Patient Advocate ?Fax: [phone]  ?

## 2021-08-21 NOTE — Telephone Encounter (Signed)
Patient Advocate Encounter ? ?Prior Authorization for Tosymra '10MG'$ /ACT Nasal Spray has been approved.   ? ?PA# [id number] ?Effective dates: 08/20/2021 through 08/20/2022 ? ? ? ? ? ?Lyndel Safe, CPhT ?Pharmacy Patient Advocate Specialist ?[facility] Patient Advocate Team ?Direct Number: [phone]  Fax: [phone]  ?

## 2021-08-31 ENCOUNTER — Other Ambulatory Visit: Payer: Self-pay | Admitting: Neurology

## 2021-09-01 NOTE — Progress Notes (Deleted)
? ?NEUROLOGY FOLLOW UP OFFICE NOTE ? ?SHERYLANN VANGORDEN ?[id number] ? ?Assessment/Plan:  ? ?Migraine without aura, without status migrainosus, not intractable - worse due to multiple stressors. 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  ?At this time, I wouldn't make any changes to migraine management as this is clearly triggered by multiple stressors. ?Migraine prevention:  Emgality ?Migraine rescue:  Refilled sumatriptan '6mg'$ .  She will try Nurtec as well ?Limit use of pain relievers to no more than 2 days out of week to prevent risk of rebound or medication-overuse headache. ?Keep headache diary ?Follow up 6 months. ?  ?  ?Subjective:  ?Emalia Witkop is a 36 year old right-handed woman with hypertension, chronic pain, depression, tobacco use disorder and history of pseudoseizure, cervical cancer and leukemia who follows up for migraines.  She is accompanied by her spouse who supplements history. ?  ?UPDATE: ?Intensity:  *** ?Duration:  *** ?Frequency:  *** ?  ?Current NSAIDS: ibuprofen ?Current analgesics: none ?Current triptans:  sumatriptan '6mg'$  [place] ?Current ergotamine:  None ?Current anti-emetic: Zofran 4 mg ?Current muscle relaxants:  Flexeril '10mg'$  ?Current anti-anxiolytic:  None ?Current sleep aide: none ?Current Antihypertensive medications: none ?Current Antidepressant medications:  None.  She avoids antidepressants due to side effects to multiple antidepressants. ?Current Anticonvulsant medications:  lamotrigine '150mg'$  twice daily (for mood) ?Current anti-CGRP:  Emgality ?Current Vitamins/Herbal/Supplements: B12 ?Current Antihistamines/Decongestants:  Benadryl ?Other therapy:  None ?Hormone/birth control: No ?  ?Caffeine:  No coffee.  ?Diet:  Drinks about 40 oz water daily.  Poor appetite due to nausea and vomiting. ?Exercise:  No ?Depression:  yes; Anxiety:  yes.     ?Other pain:  Diffuse pain ?Sleep hygiene:  Poor due to headaches.  Tired in the morning but more energy by the  afternoon but fatigued again in evening.  Melatonin not working.  Medicaid said they would not cover her sleep study. ?  ?HISTORY: ? Onset:  She has prior history of episodic migraines but they became frequent and intractable in June 2019.  No known preceding event.  Started taking pain relievers and headaches gradually got worse. ?Location:  Right sided (frontal/periorbital and temple)  ?Quality:  Throbbing ?Initial intensity:  Severe  It may wake her up at night.  She denies new headache, thunderclap headache  ?Aura:  No ?Prodrome:  No ?Postdrome:  fatigue ?Associated symptoms: Nausea, vomiting, photophobia, phonophobia, blurred vision (particularly in right eye, eye lacrimation, right frontal swelling.  The pain causes her to feel confused and irritable.  She denies associated unilateral numbness or weakness. ?Initial duration:  Constant back round headache.  Flare-ups last several hours (over 4 hours) ?Initial Frequency: Daily ?Initial Frequency of abortive medication: Takes Fioricet 4-5 days a week.   ?Triggers: Unknown ?Exacerbating factors: Closing her eyes, light, sound ?Relieving factors: Rest ?Activity:  aggravates ?  ?MRI of brain without contrast from 06/17/18 was normal. ?  ?She reported difficulty pronouncing words and finishing sentences.  She also reports difficulty with short term memory.  She underwent neuropsychological evaluation with Dr. 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 ?  ?Past NSAIDS:  Naproxen, ibuprofen ?Past analgesics:  Excedrin, Tylenol, Fioricet ?Past abortive triptans:  Maxalt, sumatriptan '100mg'$ , Tosymra NS (burns) ?Past abortive ergotamine:  None ?Past muscle relaxants:  None ?Past anti-emetic:  None ?Past antihypertensive medications:  None ?Past antidepressant medications:  Nortriptyline (irritability); venlafaxine (fatigue, felt sick) ?Past anticonvulsant medications:  topiramate '100mg'$   BID ?Past anti-CGRP:  Melodie Bouillon '100mg'$  ?Past vitamins/Herbal/Supplements:  None ?Past antihistamines/decongestants:  None ?Other past therapies:  Migraine cocktails (decreased intensity but not abort) ?  ?Family history of headache:  On mother's side. ? ?PAST MEDICAL HISTORY: ?Past Medical History:  ?Diagnosis Date  ? ADHD   ? Anxiety   ? Arthritis   ? Cervical abnormality   ? Cervical cancer ([place]) 2003  ? Chronic pain   ? Constipation   ? Depression   ? Heartburn   ? Hypertension   ? Interstitial cystitis   ? Leukemia ([place])   ? Remission for 23 yrs  ? Migraines   ? Seizures ([place])   ? Sleep apnea   ? Vaginal Pap smear, abnormal   ? ? ?MEDICATIONS: ?Current Outpatient Medications on File Prior to Visit  ?Medication Sig Dispense Refill  ? BIOTIN PO Take by mouth.    ? cyclobenzaprine (FLEXERIL) 10 MG tablet Take 10 mg by mouth 3 (three) times daily as needed for muscle spasms.    ? diphenhydrAMINE (BENADRYL) 25 mg capsule Take 25 mg by mouth.    ? EMGALITY 120 MG/ML SOAJ INJECT 120 MG (1 SYRINGE) INTO THE SKIN EVERY 28 DAYS. 1 mL 5  ? ibuprofen (ADVIL) 800 MG tablet TAKE 1 TABLET BY MOUTH EVERY 8 HOURS AS NEEDED 60 tablet 1  ? lamoTRIgine (LAMICTAL) 200 MG tablet Take 1 tablet (200 mg total) by mouth 2 (two) times daily. 180 tablet 3  ? LINZESS 72 MCG capsule Take 72 mcg by mouth every morning.    ? ondansetron (ZOFRAN-ODT) 4 MG disintegrating tablet DISSOLVE 1 TABLET UNDER THE TONGUE EVERY 8 HOURS AS NEEDED FOR NAUSEA OR VOMITING 30 tablet 1  ? pantoprazole (PROTONIX) 40 MG tablet Take 40 mg by mouth daily.    ? promethazine (PHENERGAN) 25 MG tablet TAKE 1 TABLET BY MOUTH EVERY 6 HOURS AS NEEDED FOR NAUSEA OR VOMITING 30 tablet 1  ? SUMAtriptan (TOSYMRA) 10 MG/ACT SOLN 1 spray in one nostril.  May repeat every 1 hour.  Maximum 3 sprays in 24 hours. 6 each 5  ? SUMAtriptan 6 MG/0.5ML SOAJ INJECT 0.5 ML INTO THE SKIN EVERY 2 HOURS AS NEEDED FOR MIGRAINE OR HEADACHEMAY REPEAT IN 2 HOURS IF HEADACHE PERSISTS OR  RECURS 4.5 mL 5  ? ?No current facility-administered medications on file prior to visit.  ? ? ?ALLERGIES: ?Allergies  ?Allergen Reactions  ? Tetracycline Hcl Hives  ?  Childhood allergy  ? Erythromycin Hives  ?  Childhood allergy  ? Pertussis Vaccines Hives  ?  Childhood allergy  ? Reglan [Metoclopramide] Other (See Comments)  ?  Muscle Spasms  ? Tetracyclines & Related Hives  ?  Childhood allergy  ? Latex Itching and Rash  ? ? ?FAMILY HISTORY: ?Family History  ?Problem Relation Age of Onset  ? Depression Mother   ? Bipolar disorder Mother   ? COPD Mother   ? Hypertension Mother   ? Mental illness Mother   ? Kidney disease Father   ?     partial kidney fx  ? COPD Father   ? Pancreatic cancer Paternal Grandmother   ? Celiac disease Paternal Grandmother   ? Hypertension Paternal Grandmother   ? Heart failure Paternal Grandfather   ? Prostate cancer Other   ? Healthy Son   ? Bladder Cancer Neg Hx   ? ? ?  ?Objective:  ?*** ?General: No acute distress.  Patient appears ***-groomed.   ?Head:  Normocephalic/atraumatic ?Eyes:  Fundi examined but not visualized ?  Neck: supple, no paraspinal tenderness, full range of motion ?Heart:  Regular rate and rhythm ?Lungs:  Clear to auscultation bilaterally ?Back: No paraspinal tenderness ?Neurological Exam: alert and oriented to person, place, and time.  Speech fluent and not dysarthric, language intact.  CN II-XII intact. Bulk and tone normal, muscle strength 5/5 throughout.  Sensation to light touch intact.  Deep tendon reflexes 2+ throughout, toes downgoing.  Finger to nose testing intact.  Gait normal, Romberg negative. ? ? ?Metta Clines, DO ? ?CC: *** ? ? ? ? ? ? ?

## 2021-09-02 ENCOUNTER — Ambulatory Visit: Payer: Medicaid Other | Admitting: Neurology

## 2021-09-02 ENCOUNTER — Encounter: Payer: Self-pay | Admitting: Neurology

## 2021-09-02 DIAGNOSIS — Z029 Encounter for administrative examinations, unspecified: Secondary | ICD-10-CM

## 2021-09-28 NOTE — Progress Notes (Deleted)
NEUROLOGY FOLLOW UP OFFICE NOTE  Bethany Little [id number]  Assessment/Plan:   Migraine without aura, without status migrainosus, not intractable - worse due to multiple stressors.    Migraine prevention:  Emgality Migraine rescue:  Refilled sumatriptan '6mg'$ .  She will try Nurtec as well Limit use of pain relievers to no more than 2 days out of week to prevent risk of rebound or medication-overuse headache. Keep headache diary Follow up 6 months.     Subjective:  Bethany Little is a 36 year old right-handed woman with hypertension, chronic pain, depression, tobacco use disorder and history of pseudoseizure (followed by Dr. Delice Lesch), cervical cancer and leukemia who follows up for migraines.  She is accompanied by her spouse who supplements history.   UPDATE: Nurtec???? Intensity:  *** Duration:  *** Frequency:  *** Current NSAIDS: ibuprofen Current analgesics: none Current triptans:  sumatriptan '6mg'$  [place] Current ergotamine:  None Current anti-emetic: Zofran 4 mg Current muscle relaxants:  Flexeril '10mg'$  Current anti-anxiolytic:  None Current sleep aide: none Current Antihypertensive medications: none Current Antidepressant medications:  None.  She avoids antidepressants due to side effects to multiple antidepressants. Current Anticonvulsant medications:  lamotrigine '150mg'$  twice daily (for mood) Current anti-CGRP:  Emgality Current Vitamins/Herbal/Supplements: B12 Current Antihistamines/Decongestants:  Benadryl Other therapy:  None Hormone/birth control: No   Caffeine:  No coffee.  Diet:  Drinks about 40 oz water daily.  Poor appetite due to nausea and vomiting. Exercise:  No Depression:  yes; Anxiety:  yes.     Other pain:  Diffuse pain Sleep hygiene:  Poor due to headaches.  Tired in the morning but more energy by the afternoon but fatigued again in evening.  Melatonin not working.  Medicaid said they would not cover her sleep study.   HISTORY:  Onset:  She has  prior history of episodic migraines but they became frequent and intractable in June 2019.  No known preceding event.  Started taking pain relievers and headaches gradually got worse. Location:  Right sided (frontal/periorbital and temple)  Quality:  Throbbing Initial intensity:  Severe  It may wake her up at night.  She denies new headache, thunderclap headache  Aura:  No Prodrome:  No Postdrome:  fatigue Associated symptoms: Nausea, vomiting, photophobia, phonophobia, blurred vision (particularly in right eye, eye lacrimation, right frontal swelling.  The pain causes her to feel confused and irritable.  She denies associated unilateral numbness or weakness. Initial duration:  Constant back round headache.  Flare-ups last several hours (over 4 hours) Initial Frequency: Daily Initial Frequency of abortive medication: Takes Fioricet 4-5 days a week.   Triggers: Unknown Exacerbating factors: Closing her eyes, light, sound Relieving factors: Rest Activity:  aggravates   MRI of brain without contrast from 06/17/18 was normal.   She reported difficulty pronouncing words and finishing sentences.  She also reports difficulty with short term memory.  She underwent neuropsychological evaluation with Dr. 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   Past NSAIDS:  Naproxen, ibuprofen Past analgesics:  Excedrin, Tylenol, Fioricet Past abortive triptans:  Maxalt, sumatriptan '100mg'$ , Tosymra NS (b[place]) Past abortive ergotamine:  None Past muscle relaxants:  None Past anti-emetic:  None Past antihypertensive medications:  None Past antidepressant medications:  Nortriptyline (irritability); venlafaxine (fatigue, felt sick) Past anticonvulsant medications:  topiramate '100mg'$  BID Past anti-CGRP:  Melodie Bouillon '100mg'$  Past vitamins/Herbal/Supplements:  None Past antihistamines/decongestants:  None Other past therapies:   Migraine cocktails (decreased intensity but not abort)  Family history of headache:  On mother's side.  PAST MEDICAL HISTORY: Past Medical History:  Diagnosis Date   ADHD    Anxiety    Arthritis    Cervical abnormality    Cervical cancer ([place]) 2003   Chronic pain    Constipation    Depression    Heartburn    Hypertension    Interstitial cystitis    Leukemia ([place])    Remission for 23 yrs   Migraines    Seizures ([facility])    Sleep apnea    Vaginal Pap smear, abnormal     MEDICATIONS: Current Outpatient Medications on File Prior to Visit  Medication Sig Dispense Refill   BIOTIN PO Take by mouth.     cyclobenzaprine (FLEXERIL) 10 MG tablet Take 10 mg by mouth 3 (three) times daily as needed for muscle spasms.     diphenhydrAMINE (BENADRYL) 25 mg capsule Take 25 mg by mouth.     EMGALITY 120 MG/ML SOAJ INJECT 120 MG (1 SYRINGE) INTO THE SKIN EVERY 28 DAYS. 1 mL 5   ibuprofen (ADVIL) 800 MG tablet TAKE 1 TABLET BY MOUTH EVERY 8 HOURS AS NEEDED 60 tablet 1   lamoTRIgine (LAMICTAL) 200 MG tablet Take 1 tablet (200 mg total) by mouth 2 (two) times daily. 180 tablet 3   LINZESS 72 MCG capsule Take 72 mcg by mouth every morning.     ondansetron (ZOFRAN-ODT) 4 MG disintegrating tablet DISSOLVE 1 TABLET UNDER THE TONGUE EVERY 8 HOURS AS NEEDED FOR NAUSEA OR VOMITING 30 tablet 1   pantoprazole (PROTONIX) 40 MG tablet Take 40 mg by mouth daily.     promethazine (PHENERGAN) 25 MG tablet TAKE 1 TABLET BY MOUTH EVERY 6 HOURS AS NEEDED FOR NAUSEA OR VOMITING 30 tablet 1   SUMAtriptan (TOSYMRA) 10 MG/ACT SOLN 1 spray in one nostril.  May repeat every 1 hour.  Maximum 3 sprays in 24 hours. 6 each 5   SUMAtriptan 6 MG/0.5ML SOAJ INJECT 0.5 ML INTO THE SKIN EVERY 2 HOURS AS NEEDED FOR MIGRAINE OR HEADACHEMAY REPEAT IN 2 HOURS IF HEADACHE PERSISTS OR RECURS 4.5 mL 5   No current facility-administered medications on file prior to visit.    ALLERGIES: Allergies  Allergen Reactions   Tetracycline  Hcl Hives    Childhood allergy   Erythromycin Hives    Childhood allergy   Pertussis Vaccines Hives    Childhood allergy   Reglan [Metoclopramide] Other (See Comments)    Muscle Spasms   Tetracyclines & Related Hives    Childhood allergy   Latex Itching and Rash    FAMILY HISTORY: Family History  Problem Relation Age of Onset   Depression Mother    Bipolar disorder Mother    COPD Mother    Hypertension Mother    Mental illness Mother    Kidney disease Father        partial kidney fx   COPD Father    Pancreatic cancer Paternal Grandmother    Celiac disease Paternal Grandmother    Hypertension Paternal Grandmother    Heart failure Paternal Grandfather    Prostate cancer Other    Healthy Son    Bladder Cancer Neg Hx       Objective:  *** General: No acute distress.  Patient appears ***-groomed.   Head:  Normocephalic/atraumatic Eyes:  Fundi examined but not visualized Neck: supple, no paraspinal tenderness, full range of motion Heart:  Regular rate and rhythm Lungs:  Clear to auscultation bilaterally Back: No paraspinal  tenderness Neurological Exam: alert and oriented to person, place, and time.  Speech fluent and not dysarthric, language intact.  CN II-XII intact. Bulk and tone normal, muscle strength 5/5 throughout.  Sensation to light touch intact.  Deep tendon reflexes 2+ throughout, toes downgoing.  Finger to nose testing intact.  Gait normal, Romberg negative.   Metta Clines, DO  CC: ***

## 2021-09-29 ENCOUNTER — Other Ambulatory Visit: Payer: Self-pay | Admitting: Adult Health

## 2021-09-29 ENCOUNTER — Encounter: Payer: Self-pay | Admitting: Neurology

## 2021-09-29 ENCOUNTER — Ambulatory Visit: Payer: Medicaid Other | Admitting: Neurology

## 2021-09-29 ENCOUNTER — Other Ambulatory Visit: Payer: Self-pay | Admitting: Neurology

## 2021-09-29 DIAGNOSIS — Z029 Encounter for administrative examinations, unspecified: Secondary | ICD-10-CM

## 2021-10-30 ENCOUNTER — Other Ambulatory Visit: Payer: Self-pay | Admitting: Neurology

## 2021-10-30 ENCOUNTER — Other Ambulatory Visit: Payer: Self-pay | Admitting: Adult Health

## 2021-11-02 ENCOUNTER — Other Ambulatory Visit: Payer: Self-pay | Admitting: *Deleted

## 2021-11-04 NOTE — Progress Notes (Signed)
NEUROLOGY FOLLOW UP OFFICE NOTE  Bethany Little [id number]  Assessment/Plan:   Migraine without aura, without status migrainosus, not intractable - worse due to multiple stressors. Psychogenic non-epileptic spells (PNES) - followed by Dr. Ellouise Newer   At this time, I wouldn't make any changes to migraine management.  I reiterated that she should work with her PCP about addressing management of her stress. Migraine prevention:  Emgality Migraine rescue:  sumatriptan '6mg'$  [place] Limit use of pain relievers to no more than 2 days out of week to prevent risk of rebound or medication-overuse headache. Keep headache diary Follow up 6 months.     Subjective:  Bethany Little is a 36 year old right-handed woman with hypertension, chronic pain, depression, tobacco use disorder and history of pseudoseizure, cervical cancer and leukemia who follows up for migraines.  She is accompanied by her spouse who supplements history.   UPDATE: Migraines have been well-controlled on Emgality up until last month.  Migraines were moderate, lasting less than a day with sumatriptan '6mg'$ , and averaging 5 a month.  However, she has had multiple significant stressors in 2023/02/07.  Her father passed away, her son was in the hospital, she is dealing with GI issues (such as gastric erosion) and she has been having problems with her mother who has psychiatric comorbidities.  Therefore they have been been daily.  She was unable to tolerate Tosymra.  Less than 10 aily headache - elevated blood pressure poor sleep   ibuprofen QOD, sumatriptan 10 a  month Current NSAIDS: ibuprofen Current analgesics: none Current triptans:  sumatriptan '6mg'$  [place] Current ergotamine:  None Current anti-emetic: Zofran 4 mg Current muscle relaxants:  Flexeril '10mg'$  Current anti-anxiolytic:  None Current sleep aide: none Current Antihypertensive medications: none Current Antidepressant medications:  None.  She avoids antidepressants due  to side effects to multiple antidepressants. Current Anticonvulsant medications:  lamotrigine '150mg'$  twice daily (for mood) Current anti-CGRP:  Emgality Current Vitamins/Herbal/Supplements: melatonin Current Antihistamines/Decongestants:  Benadryl Other therapy:  None Hormone/birth control: No   Caffeine:  No coffee.  Diet:  Drinks about 40 oz water daily.  Poor appetite due to nausea and vomiting. Exercise:  No Depression:  yes; Anxiety:  yes.     Other pain:  Diffuse pain Sleep hygiene:  Poor due to headaches.  Tired in the morning but more energy by the afternoon but fatigued again in evening.  Melatonin not working.  Medicaid said they would not cover her sleep study.   HISTORY:  Onset:  She has prior history of episodic migraines but they became frequent and intractable in June 2019.  No known preceding event.  Started taking pain relievers and headaches gradually got worse. Location:  Right sided (frontal/periorbital and temple)  Quality:  Throbbing Initial intensity:  Severe  It may wake her up at night.  She denies new headache, thunderclap headache  Aura:  No Prodrome:  No Postdrome:  fatigue Associated symptoms: Nausea, vomiting, photophobia, phonophobia, blurred vision (particularly in right eye, eye lacrimation, right frontal swelling.  The pain causes her to feel confused and irritable.  She denies associated unilateral numbness or weakness. Initial duration:  Constant back round headache.  Flare-ups last several hours (over 4 hours) Initial Frequency: Daily Initial Frequency of abortive medication: Takes Fioricet 4-5 days a week.   Triggers: Unknown Exacerbating factors: Closing her eyes, light, sound Relieving factors: Rest Activity:  aggravates   MRI of brain without contrast from 06/17/18 was normal.   She reported difficulty pronouncing words  and finishing sentences.  She also reports difficulty with short term memory.  She underwent neuropsychological evaluation  with Dr. 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   Past NSAIDS:  Naproxen, ibuprofen Past analgesics:  Excedrin, Tylenol, Fioricet Past abortive triptans:  Maxalt, sumatriptan '100mg'$ , Tosymra NS (b[place]) Past abortive ergotamine:  None Past muscle relaxants:  None Past anti-emetic:  None Past antihypertensive medications:  None Past antidepressant medications:  Nortriptyline (irritability); venlafaxine (fatigue, felt sick), sertraline (side effect), duloxetine (side effect) Past anticonvulsant medications:  topiramate '100mg'$  BID Past anti-CGRP:  Melodie Bouillon '100mg'$  Past vitamins/Herbal/Supplements:  None Past antihistamines/decongestants:  None Other past therapies:  Migraine cocktails (decreased intensity but not abort)   Family history of headache:  On mother's side.  PAST MEDICAL HISTORY: Past Medical History:  Diagnosis Date   ADHD    Anxiety    Arthritis    Cervical abnormality    Cervical cancer ([place]) 2003   Chronic pain    Constipation    Depression    Heartburn    Hypertension    Interstitial cystitis    Leukemia ([place])    Remission for 23 yrs   Migraines    Seizures ([facility])    Sleep apnea    Vaginal Pap smear, abnormal     MEDICATIONS: Current Outpatient Medications on File Prior to Visit  Medication Sig Dispense Refill   BIOTIN PO Take by mouth.     cyclobenzaprine (FLEXERIL) 10 MG tablet Take 10 mg by mouth 3 (three) times daily as needed for muscle spasms.     diphenhydrAMINE (BENADRYL) 25 mg capsule Take 25 mg by mouth.     EMGALITY 120 MG/ML SOAJ INJECT 120 MG (1 SYRINGE) INTO THE SKIN EVERY 28 DAYS. 1 mL 5   ibuprofen (ADVIL) 800 MG tablet TAKE 1 TABLET BY MOUTH EVERY 8 HOURS AS NEEDED 60 tablet 1   lamoTRIgine (LAMICTAL) 200 MG tablet Take 1 tablet (200 mg total) by mouth 2 (two) times daily. 180 tablet 3   LINZESS 72 MCG capsule Take 72 mcg by mouth every morning.      ondansetron (ZOFRAN-ODT) 4 MG disintegrating tablet DISSOLVE 1 TABLET UNDER THE TONGUE EVERY 8 HOURS AS NEEDED FOR NAUSEA OR VOMITING 30 tablet 0   pantoprazole (PROTONIX) 40 MG tablet Take 40 mg by mouth daily.     promethazine (PHENERGAN) 25 MG tablet TAKE 1 TABLET BY MOUTH EVERY 6 HOURS AS NEEDED FOR NAUSEA OR VOMITING 30 tablet 1   SUMAtriptan (TOSYMRA) 10 MG/ACT SOLN 1 spray in one nostril.  May repeat every 1 hour.  Maximum 3 sprays in 24 hours. 6 each 5   SUMAtriptan 6 MG/0.5ML SOAJ INJECT 0.5 ML INTO THE SKIN EVERY 2 HOURS AS NEEDED FOR MIGRAINE OR HEADACHEMAY REPEAT IN 2 HOURS IF HEADACHE PERSISTS OR RECURS 4.5 mL 5   No current facility-administered medications on file prior to visit.    ALLERGIES: Allergies  Allergen Reactions   Tetracycline Hcl Hives    Childhood allergy   Erythromycin Hives    Childhood allergy   Pertussis Vaccines Hives    Childhood allergy   Reglan [Metoclopramide] Other (See Comments)    Muscle Spasms   Tetracyclines & Related Hives    Childhood allergy   Latex Itching and Rash    FAMILY HISTORY: Family History  Problem Relation Age of Onset   Depression Mother    Bipolar disorder Mother    COPD Mother  Hypertension Mother    Mental illness Mother    Kidney disease Father        partial kidney fx   COPD Father    Pancreatic cancer Paternal Grandmother    Celiac disease Paternal Grandmother    Hypertension Paternal Grandmother    Heart failure Paternal Grandfather    Prostate cancer Other    Healthy Son    Bladder Cancer Neg Hx       Objective:  Blood pressure 136/85, pulse 94, height '5\' 4"'$  (1.626 m), weight 174 lb 3.5 oz (79 kg), SpO2 95 %. General: No acute distress.  Patient appears well-groomed.   Head:  Normocephalic/atraumatic Eyes:  Fundi examined but not visualized Neck: supple, no paraspinal tenderness, full range of motion Heart:  Regular rate and rhythm Lungs:  Clear to auscultation bilaterally Back: No paraspinal  tenderness Neurological Exam: alert and oriented to person, place, and time.  Speech fluent and not dysarthric, language intact.  CN II-XII intact. Bulk and tone normal, muscle strength 5/5 throughout.  Sensation to light touch intact.  Deep tendon reflexes 2+ throughout, toes downgoing.  Finger to nose testing intact.  Gait normal, Romberg negative.   Metta Clines, DO  CC: Beverlyn Roux, MD

## 2021-11-09 ENCOUNTER — Encounter: Payer: Self-pay | Admitting: Neurology

## 2021-11-09 ENCOUNTER — Ambulatory Visit: Payer: Medicaid Other | Admitting: Neurology

## 2021-11-09 VITALS — BP 136/85 | HR 94 | Ht 64.0 in | Wt 174.2 lb

## 2021-11-09 DIAGNOSIS — G43009 Migraine without aura, not intractable, without status migrainosus: Secondary | ICD-10-CM

## 2021-11-09 DIAGNOSIS — F445 Conversion disorder with seizures or convulsions: Secondary | ICD-10-CM

## 2021-11-09 MED ORDER — SUMATRIPTAN SUCCINATE 6 MG/0.5ML ~~LOC~~ SOAJ: SUBCUTANEOUS | 5 refills | Status: DC

## 2021-11-30 ENCOUNTER — Other Ambulatory Visit: Payer: Self-pay | Admitting: Neurology

## 2021-11-30 ENCOUNTER — Other Ambulatory Visit: Payer: Self-pay | Admitting: Adult Health

## 2021-12-01 ENCOUNTER — Other Ambulatory Visit: Payer: Self-pay | Admitting: Adult Health

## 2021-12-10 ENCOUNTER — Ambulatory Visit: Payer: Medicaid Other | Admitting: Neurology

## 2022-02-01 ENCOUNTER — Other Ambulatory Visit: Payer: Self-pay | Admitting: Adult Health

## 2022-02-24 ENCOUNTER — Telehealth: Payer: Self-pay | Admitting: Pharmacy Technician

## 2022-02-24 NOTE — Telephone Encounter (Signed)
Patient Advocate Encounter  Prior Authorization for  Emgality '120mg'$ /mL pen has been approved.     Effective: 02-24-2022 to 02-24-2023

## 2022-02-24 NOTE — Telephone Encounter (Signed)
Submitted a Prior Authorization request to [employer] for  Emgality '120mg'$   via CoverMyMeds. Will update once we receive a response.   Key: [id number]

## 2022-04-01 ENCOUNTER — Other Ambulatory Visit: Payer: Self-pay | Admitting: Adult Health

## 2022-04-01 ENCOUNTER — Other Ambulatory Visit: Payer: Self-pay | Admitting: Neurology

## 2022-05-10 NOTE — Progress Notes (Deleted)
 NEUROLOGY FOLLOW UP OFFICE NOTE  Bethany Little [id number]  Assessment/Plan:   Migraine without aura, without status migrainosus, not intractable - worse due to multiple stressors. Psychogenic non-epileptic spells (PNES) - followed by Dr. Karen Aquino    Migraine prevention:  Emgality Will provide Nurtec to take daily for the 8 days prior to next Emgality injection Migraine rescue:  sumatriptan 6mg [place] Limit use of pain relievers to no more than 2 days out of week to prevent risk of rebound or medication-overuse headache. Keep headache diary Follow up 6 months.     Subjective:  Bethany Little is a 36 year old right-handed woman with hypertension, chronic pain, depression, tobacco use disorder and history of pseudoseizure, cervical cancer and leukemia who follows up for migraines.  She is accompanied by her spouse who supplements history.   UPDATE: For the past two months, she has not been taking Emgality on time due to other issues.  Her husband was in the hospital for stroke.  She has lost several family members.  She is under a lot of stress. She also had fallen and hit her head which is contributing to headaches.  Headaches have been worse.   Intensity:  severe Duration:  2 days with shot. Frequency:  almost daily for the last week prior to next injection.     ibuprofen QOD, sumatriptan 10 a  month Current NSAIDS: ibuprofen Current analgesics: none Current triptans:  sumatriptan 6mg [place] Current ergotamine:  None Current anti-emetic: Zofran 4 mg Current muscle relaxants:  Flexeril 10mg Current anti-anxiolytic:  None Current sleep aide: none Current Antihypertensive medications: none Current Antidepressant medications:  None.  She avoids antidepressants due to side effects to multiple antidepressants. Current Anticonvulsant medications:  lamotrigine 150mg twice daily (for mood) Current anti-CGRP:  Emgality Current Vitamins/Herbal/Supplements: melatonin Current  Antihistamines/Decongestants:  Benadryl Other therapy:  None Hormone/birth control: No   Caffeine:  No coffee.  Diet:  Drinks about 40 oz water daily.  Poor appetite due to nausea and vomiting. Exercise:  No Depression:  yes; Anxiety:  yes.     Other pain:  Diffuse pain Sleep hygiene:  Poor due to headaches.  Tired in the morning but more energy by the afternoon but fatigued again in evening.  Melatonin not working.  Medicaid said they would not cover her sleep study.   HISTORY:  Onset:  She has prior history of episodic migraines but they became frequent and intractable in June 2019.  No known preceding event.  Started taking pain relievers and headaches gradually got worse. Location:  Right sided (frontal/periorbital and temple)  Quality:  Throbbing Initial intensity:  Severe  It may wake her up at night.  She denies new headache, thunderclap headache  Aura:  No Prodrome:  No Postdrome:  fatigue Associated symptoms: Nausea, vomiting, photophobia, phonophobia, blurred vision (particularly in right eye, eye lacrimation, right frontal swelling.  The pain causes her to feel confused and irritable.  She denies associated unilateral numbness or weakness. Initial duration:  Constant back round headache.  Flare-ups last several hours (over 4 hours) Initial Frequency: Daily Initial Frequency of abortive medication: Takes Fioricet 4-5 days a week.   Triggers: Unknown Exacerbating factors: Closing her eyes, light, sound Relieving factors: Rest Activity:  aggravates   MRI of brain without contrast from 06/17/18 was normal.   She reported difficulty pronouncing words and finishing sentences.  She also reports difficulty with short term memory.  She underwent neuropsychological evaluation with Dr. 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   Past NSAIDS:  Naproxen, ibuprofen Past analgesics:  Excedrin, Tylenol,  Fioricet Past abortive triptans:  Maxalt, sumatriptan 100mg, Tosymra NS (burns) Past abortive ergotamine:  None Past muscle relaxants:  None Past anti-emetic:  None Past antihypertensive medications:  None Past antidepressant medications:  Nortriptyline (irritability); venlafaxine (fatigue, felt sick), sertraline (side effect), duloxetine (side effect) Past anticonvulsant medications:  topiramate 100mg BID Past anti-CGRP:  Ajovy, Ubrelvy 100mg Past vitamins/Herbal/Supplements:  None Past antihistamines/decongestants:  None Other past therapies:  Migraine cocktails (decreased intensity but not abort)   Family history of headache:  On mother's side.  PAST MEDICAL HISTORY: Past Medical History:  Diagnosis Date   ADHD    Anxiety    Arthritis    Cervical abnormality    Cervical cancer (HCC) 2003   Chronic pain    Constipation    Depression    Heartburn    Hypertension    Interstitial cystitis    Leukemia (HCC)    Remission for 23 yrs   Migraines    Seizures (HCC)    Sleep apnea    Vaginal Pap smear, abnormal     MEDICATIONS: Current Outpatient Medications on File Prior to Visit  Medication Sig Dispense Refill   BIOTIN PO Take by mouth.     cyclobenzaprine (FLEXERIL) 10 MG tablet Take 10 mg by mouth 3 (three) times daily as needed for muscle spasms.     diphenhydrAMINE (BENADRYL) 25 mg capsule Take 25 mg by mouth.     Galcanezumab-gnlm (EMGALITY) 120 MG/ML SOAJ INJECT 120 MG (1 SYRINGE) INTO THE SKIN EVERY 28 DAYS. 1 mL 1   ibuprofen (ADVIL) 800 MG tablet TAKE 1 TABLET BY MOUTH EVERY 8 HOURS AS NEEDED 60 tablet 1   lamoTRIgine (LAMICTAL) 200 MG tablet Take 1 tablet (200 mg total) by mouth 2 (two) times daily. 180 tablet 3   LINZESS 72 MCG capsule Take 72 mcg by mouth every morning.     ondansetron (ZOFRAN-ODT) 4 MG disintegrating tablet DISSOLVE 1 TABLET UNDER THE TONGUE EVERY 8 HOURS AS NEEDED FOR NAUSEA OR VOMITING 30 tablet 2   pantoprazole (PROTONIX) 40 MG tablet Take 40  mg by mouth daily.     promethazine (PHENERGAN) 25 MG tablet TAKE 1 TABLET BY MOUTH EVERY 6 HOURS AS NEEDED FOR NAUSEA OR VOMITING 30 tablet 1   SUMAtriptan (TOSYMRA) 10 MG/ACT SOLN 1 spray in one nostril.  May repeat every 1 hour.  Maximum 3 sprays in 24 hours. 6 each 5   SUMAtriptan 6 MG/0.5ML SOAJ INJECT 0.5 ML INTO THE SKIN EVERY 2 HOURS AS NEEDED FOR MIGRAINE OR HEADACHEMAY REPEAT IN 2 HOURS IF HEADACHE PERSISTS OR RECURS 4.5 mL 5   No current facility-administered medications on file prior to visit.    ALLERGIES: Allergies  Allergen Reactions   Tetracycline Hcl Hives    Childhood allergy   Erythromycin Hives    Childhood allergy   Pertussis Vaccines Hives    Childhood allergy   Reglan [Metoclopramide] Other (See Comments)    Muscle Spasms   Tetracyclines & Related Hives    Childhood allergy   Latex Itching and Rash    FAMILY HISTORY: Family History  Problem Relation Age of Onset   Depression Mother    Bipolar disorder Mother    COPD Mother    Hypertension Mother    Mental illness Mother    Kidney disease Father          partial kidney fx   COPD Father    Pancreatic cancer Paternal Grandmother    Celiac disease Paternal Grandmother    Hypertension Paternal Grandmother    Heart failure Paternal Grandfather    Prostate cancer Other    Healthy Son    Bladder Cancer Neg Hx       Objective:  Blood pressure 134/81, pulse (!) 110, height 5' 4" (1.626 m), weight 175 lb (79.4 kg), SpO2 100 %. General: No acute distress.  Patient appears well-groomed.      Bethany Zapanta, DO  CC: Elena Adamo, MD    

## 2022-05-11 ENCOUNTER — Ambulatory Visit: Payer: Medicaid Other | Admitting: Neurology

## 2022-06-08 NOTE — Progress Notes (Deleted)
NEUROLOGY FOLLOW UP OFFICE NOTE  MARQUEE PISKOR [id number]  Assessment/Plan:   Migraine without aura, without status migrainosus, not intractable - worse due to multiple stressors. Psychogenic non-epileptic spells (PNES) - followed by Dr. Ellouise Newer    Migraine prevention:  Emgality Migraine rescue:  sumatriptan 82m [place] Limit use of pain relievers to no more than 2 days out of week to prevent risk of rebound or medication-overuse headache. Keep headache diary Follow up 6 months.     Subjective:  Bethany Larcomis a 37year old right-handed woman with hypertension, chronic pain, depression, tobacco use disorder and history of pseudoseizure, cervical cancer and leukemia who follows up for migraines.  She is accompanied by her spouse who supplements history.   UPDATE: Intensity:  *** Duration:  *** Frequency:  ***   ibuprofen QOD, sumatriptan 10 a  month Current NSAIDS: ibuprofen Current analgesics: none Current triptans:  sumatriptan 654mSC Current ergotamine:  None Current anti-emetic: Zofran 4 mg Current muscle relaxants:  Flexeril 1082murrent anti-anxiolytic:  None Current sleep aide: none Current Antihypertensive medications: none Current Antidepressant medications:  None.  She avoids antidepressants due to side effects to multiple antidepressants. Current Anticonvulsant medications:  lamotrigine 150m30mice daily (for mood) Current anti-CGRP:  Emgality Current Vitamins/Herbal/Supplements: melatonin Current Antihistamines/Decongestants:  Benadryl Other therapy:  None Hormone/birth control: No   Caffeine:  No coffee.  Diet:  Drinks about 40 oz water daily.  Poor appetite due to nausea and vomiting. Exercise:  No Depression:  yes; Anxiety:  yes.     Other pain:  Diffuse pain Sleep hygiene:  Poor due to headaches.  Tired in the morning but more energy by the afternoon but fatigued again in evening.  Melatonin not working.  Medicaid said they would not cover  her sleep study.   HISTORY:  Onset:  She has prior history of episodic migraines but they became frequent and intractable in June 2019.  No known preceding event.  Started taking pain relievers and headaches gradually got worse. Location:  Right sided (frontal/periorbital and temple)  Quality:  Throbbing Initial intensity:  Severe  It may wake her up at night.  She denies new headache, thunderclap headache  Aura:  No Prodrome:  No Postdrome:  fatigue Associated symptoms: Nausea, vomiting, photophobia, phonophobia, blurred vision (particularly in right eye, eye lacrimation, right frontal swelling.  The pain causes her to feel confused and irritable.  She denies associated unilateral numbness or weakness. Initial duration:  Constant back round headache.  Flare-ups last several hours (over 4 hours) Initial Frequency: Daily Initial Frequency of abortive medication: Takes Fioricet 4-5 days a week.   Triggers: Unknown Exacerbating factors: Closing her eyes, light, sound Relieving factors: Rest Activity:  aggravates   MRI of brain without contrast from 06/17/18 was normal.   She reported difficulty pronouncing words and finishing sentences.  She also reports difficulty with short term memory.  She underwent neuropsychological evaluation with Dr. 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   Past NSAIDS:  Naproxen, ibuprofen Past analgesics:  Excedrin, Tylenol, Fioricet Past abortive triptans:  Maxalt, sumatriptan 100mg7msymra NS (burns) Past abortive ergotamine:  None Past muscle relaxants:  None Past anti-emetic:  None Past antihypertensive medications:  None Past antidepressant medications:  Nortriptyline (irritability); venlafaxine (fatigue, felt sick), sertraline (side effect), duloxetine (side effect) Past anticonvulsant medications:  topiramate 100mg 24mPast anti-CGRP:  Ajovy, Ubrelvy 100mg P68m vitamins/Herbal/Supplements:  None Past antihistamines/decongestants:  None Other past therapies:  Migraine cocktails (decreased intensity but not abort)   Family history of headache:  On mother's side.  PAST MEDICAL HISTORY: Past Medical History:  Diagnosis Date   ADHD    Anxiety    Arthritis    Cervical abnormality    Cervical cancer ([place]) 2003   Chronic pain    Constipation    Depression    Heartburn    Hypertension    Interstitial cystitis    Leukemia ([place])    Remission for 23 yrs   Migraines    Seizures (HCC)    Sleep apnea    Vaginal Pap smear, abnormal     MEDICATIONS: Current Outpatient Medications on File Prior to Visit  Medication Sig Dispense Refill   BIOTIN PO Take by mouth.     cyclobenzaprine (FLEXERIL) 10 MG tablet Take 10 mg by mouth 3 (three) times daily as needed for muscle spasms.     diphenhydrAMINE (BENADRYL) 25 mg capsule Take 25 mg by mouth.     Galcanezumab-gnlm (EMGALITY) 120 MG/ML SOAJ INJECT 120 MG (1 SYRINGE) INTO THE SKIN EVERY 28 DAYS. 1 mL 1   ibuprofen (ADVIL) 800 MG tablet TAKE 1 TABLET BY MOUTH EVERY 8 HOURS AS NEEDED 60 tablet 1   lamoTRIgine (LAMICTAL) 200 MG tablet Take 1 tablet (200 mg total) by mouth 2 (two) times daily. 180 tablet 3   LINZESS 72 MCG capsule Take 72 mcg by mouth every morning.     ondansetron (ZOFRAN-ODT) 4 MG disintegrating tablet DISSOLVE 1 TABLET UNDER THE TONGUE EVERY 8 HOURS AS NEEDED FOR NAUSEA OR VOMITING 30 tablet 2   pantoprazole (PROTONIX) 40 MG tablet Take 40 mg by mouth daily.     promethazine (PHENERGAN) 25 MG tablet TAKE 1 TABLET BY MOUTH EVERY 6 HOURS AS NEEDED FOR NAUSEA OR VOMITING 30 tablet 1   SUMAtriptan (TOSYMRA) 10 MG/ACT SOLN 1 spray in one nostril.  May repeat every 1 hour.  Maximum 3 sprays in 24 hours. 6 each 5   SUMAtriptan 6 MG/0.5ML SOAJ INJECT 0.5 ML INTO THE SKIN EVERY 2 HOURS AS NEEDED FOR MIGRAINE OR HEADACHEMAY REPEAT IN 2 HOURS IF HEADACHE PERSISTS OR RECURS 4.5 mL 5   No current  facility-administered medications on file prior to visit.    ALLERGIES: Allergies  Allergen Reactions   Tetracycline Hcl Hives    Childhood allergy   Erythromycin Hives    Childhood allergy   Pertussis Vaccines Hives    Childhood allergy   Reglan [Metoclopramide] Other (See Comments)    Muscle Spasms   Tetracyclines & Related Hives    Childhood allergy   Latex Itching and Rash    FAMILY HISTORY: Family History  Problem Relation Age of Onset   Depression Mother    Bipolar disorder Mother    COPD Mother    Hypertension Mother    Mental illness Mother    Kidney disease Father        partial kidney fx   COPD Father    Pancreatic cancer Paternal Grandmother    Celiac disease Paternal Grandmother    Hypertension Paternal Grandmother    Heart failure Paternal Grandfather    Prostate cancer Other    Healthy Son    Bladder Cancer Neg Hx       Objective:  *** General: No acute distress.  Patient appears well-groomed.   Head:  Normocephalic/atraumatic Eyes:  Fundi examined but not visualized Neck: supple, no paraspinal tenderness, full range of motion  Heart:  Regular rate and rhythm Neurological Exam: ***   Metta Clines, DO  CC: Beverlyn Roux, MD

## 2022-06-09 ENCOUNTER — Ambulatory Visit: Payer: Medicaid Other | Admitting: Neurology

## 2022-06-09 ENCOUNTER — Encounter: Payer: Self-pay | Admitting: Neurology

## 2022-06-10 ENCOUNTER — Telehealth: Payer: Self-pay | Admitting: Neurology

## 2022-06-10 ENCOUNTER — Other Ambulatory Visit: Payer: Self-pay | Admitting: Neurology

## 2022-06-10 ENCOUNTER — Other Ambulatory Visit: Payer: Self-pay | Admitting: Adult Health

## 2022-06-10 NOTE — Telephone Encounter (Signed)
Pt also wanted me to notate she didn't realize her appointment was yesterday. She has been at the hospital with her husband.

## 2022-06-10 NOTE — Telephone Encounter (Signed)
1. Which medications need refilled? (List name and dosage, if known) emgality  2. Which pharmacy/location is medication to be sent to? (include street and city if local pharmacy) [place]  Pt has scheduled a follow up with Dr. Tomi Likens for 07/08/22

## 2022-06-11 ENCOUNTER — Other Ambulatory Visit: Payer: Self-pay | Admitting: Neurology

## 2022-06-11 MED ORDER — EMGALITY 120 MG/ML ~~LOC~~ SOAJ: SUBCUTANEOUS | 1 refills | Status: DC

## 2022-06-11 NOTE — Telephone Encounter (Signed)
Refill sent.

## 2022-07-07 ENCOUNTER — Other Ambulatory Visit: Payer: Self-pay | Admitting: Adult Health

## 2022-07-07 NOTE — Progress Notes (Signed)
NEUROLOGY FOLLOW UP OFFICE NOTE  [name] [id number]  Assessment/Plan:   Migraine without aura, without status migrainosus, not intractable - worse due to multiple stressors. Psychogenic non-epileptic spells (PNES) - followed by Dr. Patrcia Dolly    Migraine prevention:  Emgality Will provide Nurtec to take daily for the 8 days prior to next Emgality injection Migraine rescue:  sumatriptan 6mg  [place] Limit use of pain relievers to no more than 2 days out of week to prevent risk of rebound or medication-overuse headache. Keep headache diary Follow up 6 months.     Subjective:  Bethany Little is a 37 year old right-handed woman with hypertension, chronic pain, depression, tobacco use disorder and history of pseudoseizure, cervical cancer and leukemia who follows up for migraines.  She is accompanied by her spouse who supplements history.   UPDATE: For the past two months, she has not been taking Emgality on time due to other issues.  Her husband was in the hospital for stroke.  She has lost several family members.  She is under a lot of stress. She also had fallen and hit her head which is contributing to headaches.  Headaches have been worse.   Intensity:  severe Duration:  2 days with shot. Frequency:  almost daily for the last week prior to next injection.     ibuprofen QOD, sumatriptan 10 a  month Current NSAIDS: ibuprofen Current analgesics: none Current triptans:  sumatriptan 6mg   Current ergotamine:  None Current anti-emetic: Zofran 4 mg Current muscle relaxants:  Flexeril 10mg  Current anti-anxiolytic:  None Current sleep aide: none Current Antihypertensive medications: none Current Antidepressant medications:  None.  She avoids antidepressants due to side effects to multiple antidepressants. Current Anticonvulsant medications:  lamotrigine 150mg  twice daily (for mood) Current anti-CGRP:  Emgality Current Vitamins/Herbal/Supplements: melatonin Current  Antihistamines/Decongestants:  Benadryl Other therapy:  None Hormone/birth control: No   Caffeine:  No coffee.  Diet:  Drinks about 40 oz water daily.  Poor appetite due to nausea and vomiting. Exercise:  No Depression:  yes; Anxiety:  yes.     Other pain:  Diffuse pain Sleep hygiene:  Poor due to headaches.  Tired in the morning but more energy by the afternoon but fatigued again in evening.  Melatonin not working.  Medicaid said they would not cover her sleep study.   HISTORY:  Onset:  She has prior history of episodic migraines but they became frequent and intractable in June 2019.  No known preceding event.  Started taking pain relievers and headaches gradually got worse. Location:  Right sided (frontal/periorbital and temple)  Quality:  Throbbing Initial intensity:  Severe  It may wake her up at night.  She denies new headache, thunderclap headache  Aura:  No Prodrome:  No Postdrome:  fatigue Associated symptoms: Nausea, vomiting, photophobia, phonophobia, blurred vision (particularly in right eye, eye lacrimation, right frontal swelling.  The pain causes her to feel confused and irritable.  She denies associated unilateral numbness or weakness. Initial duration:  Constant back round headache.  Flare-ups last several hours (over 4 hours) Initial Frequency: Daily Initial Frequency of abortive medication: Takes Fioricet 4-5 days a week.   Triggers: Unknown Exacerbating factors: Closing her eyes, light, sound Relieving factors: Rest Activity:  aggravates   MRI of brain without contrast from 06/17/18 was normal.   She reported difficulty pronouncing words and finishing sentences.  She also reports difficulty with short term memory.  She underwent neuropsychological evaluation with Dr. 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   Past NSAIDS:  Naproxen, ibuprofen Past analgesics:  Excedrin, Tylenol,  Fioricet Past abortive triptans:  Maxalt, sumatriptan 100mg , Tosymra NS (burns) Past abortive ergotamine:  None Past muscle relaxants:  None Past anti-emetic:  None Past antihypertensive medications:  None Past antidepressant medications:  Nortriptyline (irritability); venlafaxine (fatigue, felt sick), sertraline (side effect), duloxetine (side effect) Past anticonvulsant medications:  topiramate 100mg  BID Past anti-CGRP:  Ashby Dawes 100mg  Past vitamins/Herbal/Supplements:  None Past antihistamines/decongestants:  None Other past therapies:  Migraine cocktails (decreased intensity but not abort)   Family history of headache:  On mother's side.  PAST MEDICAL HISTORY: Past Medical History:  Diagnosis Date   ADHD    Anxiety    Arthritis    Cervical abnormality    Cervical cancer (HCC) 2003   Chronic pain    Constipation    Depression    Heartburn    Hypertension    Interstitial cystitis    Leukemia (HCC)    Remission for 23 yrs   Migraines    Seizures (HCC)    Sleep apnea    Vaginal Pap smear, abnormal     MEDICATIONS: Current Outpatient Medications on File Prior to Visit  Medication Sig Dispense Refill   BIOTIN PO Take by mouth.     cyclobenzaprine (FLEXERIL) 10 MG tablet Take 10 mg by mouth 3 (three) times daily as needed for muscle spasms.     diphenhydrAMINE (BENADRYL) 25 mg capsule Take 25 mg by mouth.     Galcanezumab-gnlm (EMGALITY) 120 MG/ML SOAJ INJECT 120 MG (1 SYRINGE) INTO THE SKIN EVERY 28 DAYS. 1 mL 1   ibuprofen (ADVIL) 800 MG tablet TAKE 1 TABLET BY MOUTH EVERY 8 HOURS AS NEEDED 60 tablet 1   lamoTRIgine (LAMICTAL) 200 MG tablet Take 1 tablet (200 mg total) by mouth 2 (two) times daily. 180 tablet 3   LINZESS 72 MCG capsule Take 72 mcg by mouth every morning.     ondansetron (ZOFRAN-ODT) 4 MG disintegrating tablet DISSOLVE 1 TABLET UNDER THE TONGUE EVERY 8 HOURS AS NEEDED FOR NAUSEA OR VOMITING 30 tablet 2   pantoprazole (PROTONIX) 40 MG tablet Take 40  mg by mouth daily.     promethazine (PHENERGAN) 25 MG tablet TAKE 1 TABLET BY MOUTH EVERY 6 HOURS AS NEEDED FOR NAUSEA OR VOMITING 30 tablet 1   SUMAtriptan (TOSYMRA) 10 MG/ACT SOLN 1 spray in one nostril.  May repeat every 1 hour.  Maximum 3 sprays in 24 hours. 6 each 5   SUMAtriptan 6 MG/0.5ML SOAJ INJECT 0.5 ML INTO THE SKIN EVERY 2 HOURS AS NEEDED FOR MIGRAINE OR HEADACHEMAY REPEAT IN 2 HOURS IF HEADACHE PERSISTS OR RECURS 4.5 mL 5   No current facility-administered medications on file prior to visit.    ALLERGIES: Allergies  Allergen Reactions   Tetracycline Hcl Hives    Childhood allergy   Erythromycin Hives    Childhood allergy   Pertussis Vaccines Hives    Childhood allergy   Reglan [Metoclopramide] Other (See Comments)    Muscle Spasms   Tetracyclines & Related Hives    Childhood allergy   Latex Itching and Rash    FAMILY HISTORY: Family History  Problem Relation Age of Onset   Depression Mother    Bipolar disorder Mother    COPD Mother    Hypertension Mother    Mental illness Mother    Kidney disease Father  partial kidney fx   COPD Father    Pancreatic cancer Paternal Grandmother    Celiac disease Paternal Grandmother    Hypertension Paternal Grandmother    Heart failure Paternal Grandfather    Prostate cancer Other    Healthy Son    Bladder Cancer Neg Hx       Objective:  Blood pressure 134/81, pulse (!) 110, height 5\' 4"  (1.626 m), weight 175 lb (79.4 kg), SpO2 100 %. General: No acute distress.  Patient appears well-groomed.      Shon Millet, DO  CC: Beverely Low, MD

## 2022-07-08 ENCOUNTER — Ambulatory Visit: Payer: Medicaid Other | Admitting: Neurology

## 2022-07-08 ENCOUNTER — Encounter: Payer: Self-pay | Admitting: Neurology

## 2022-07-08 VITALS — BP 134/81 | HR 110 | Ht 64.0 in | Wt 175.0 lb

## 2022-07-08 DIAGNOSIS — G43009 Migraine without aura, not intractable, without status migrainosus: Secondary | ICD-10-CM | POA: Diagnosis not present

## 2022-07-08 MED ORDER — SUMATRIPTAN SUCCINATE 6 MG/0.5ML ~~LOC~~ SOAJ: SUBCUTANEOUS | 5 refills | Status: AC

## 2022-07-08 MED ORDER — EMGALITY 120 MG/ML ~~LOC~~ SOAJ: SUBCUTANEOUS | 5 refills | Status: AC

## 2022-07-08 NOTE — Patient Instructions (Signed)
Emgality every 30 days Take Nurtec daily beginning 8 days prior to next injection.  Let me know if it works Sumatriptan shot as needed Follow up 6 months.

## 2022-07-15 ENCOUNTER — Other Ambulatory Visit (HOSPITAL_COMMUNITY): Payer: Self-pay

## 2022-08-04 ENCOUNTER — Other Ambulatory Visit (HOSPITAL_COMMUNITY): Payer: Self-pay

## 2022-08-09 ENCOUNTER — Other Ambulatory Visit: Payer: Self-pay | Admitting: Adult Health

## 2022-08-13 ENCOUNTER — Encounter: Payer: Self-pay | Admitting: Neurology

## 2022-08-13 ENCOUNTER — Ambulatory Visit: Payer: Medicaid Other | Admitting: Neurology

## 2022-08-13 ENCOUNTER — Telehealth: Payer: Self-pay | Admitting: Anesthesiology

## 2022-08-13 ENCOUNTER — Other Ambulatory Visit: Payer: Self-pay | Admitting: Neurology

## 2022-08-13 VITALS — BP 114/79 | HR 115 | Resp 18 | Ht 64.0 in | Wt 175.0 lb

## 2022-08-13 DIAGNOSIS — F419 Anxiety disorder, unspecified: Secondary | ICD-10-CM

## 2022-08-13 DIAGNOSIS — F445 Conversion disorder with seizures or convulsions: Secondary | ICD-10-CM

## 2022-08-13 DIAGNOSIS — F32A Depression, unspecified: Secondary | ICD-10-CM

## 2022-08-13 MED ORDER — NURTEC 75 MG PO TBDP: 75.0000 mg | ORAL_TABLET | Freq: Every day | ORAL | 11 refills | Status: AC | PRN

## 2022-08-13 MED ORDER — LAMOTRIGINE 200 MG PO TABS: 200.0000 mg | ORAL_TABLET | Freq: Two times a day (BID) | ORAL | 3 refills | Status: AC

## 2022-08-13 NOTE — Patient Instructions (Signed)
Good to see you.  Continue Lamotrigine 200mg  twice a day  2. Referral will be sent for Psychiatry and psychotherapy  3. Continue follow-up with Dr. Tomi Likens, follow-up with me in 1 year, call for any changes
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
## 2022-08-13 NOTE — Telephone Encounter (Signed)
Pt was given Nurtec samples by Dr Tomi Likens and is requesting a Rx for Nurtec to be sent to her pharmacy at [facility] in [place].

## 2022-08-18 ENCOUNTER — Telehealth: Payer: Self-pay

## 2022-08-18 NOTE — Telephone Encounter (Signed)
Patient Advocate Encounter  Prior Authorization for SUMAtriptan Succinate 6MG /0.5ML auto-injectors has been approved through [employer].    KeyCB:[id number]  Effective: 08-18-2022 to 08-18-2023

## 2022-08-18 NOTE — Telephone Encounter (Signed)
Patient Advocate Encounter   Received notification from [facility] that prior authorization is required for SUMAtriptan Succinate 6MG /0.5ML auto-injectors   Submitted: 08-18-2022 Key [id number]  Status is pending

## 2022-09-30 ENCOUNTER — Telehealth: Payer: Self-pay

## 2022-09-30 ENCOUNTER — Other Ambulatory Visit (HOSPITAL_COMMUNITY): Payer: Self-pay

## 2022-09-30 NOTE — Telephone Encounter (Signed)
PA request received via CMM for Nurtec 75MG  dispersible tablets  PA has been submitted to [employer] [facility] and is pending determination  Key: [id number]

## 2022-10-08 ENCOUNTER — Other Ambulatory Visit: Payer: Self-pay | Admitting: Adult Health

## 2022-10-11 NOTE — Telephone Encounter (Signed)
[facility] reviewed your NURTEC ODT 75 MG TABLET request for the above-identified  member, and it is denied for the following reason: because we did not see what we need to  approve the drug you asked for, (Nurtec ODT 75 milligram). We may be able to approve this  drug in a certain situation (when you have had a headache frequency of less than 15  headache days per month during the prior 6 months). We do not see that this applies to you.  We based this decision on your health plan's prior authorization clinical criteria named  Migraine Therapy - Calcitonin Gene-Related Inhibitors

## 2022-10-23 ENCOUNTER — Telehealth: Payer: Self-pay | Admitting: Pharmacy Technician

## 2022-10-23 NOTE — Telephone Encounter (Signed)
Patient Advocate Encounter  Received notification from [employer] that prior authorization for NURTEC 75MG  is required.   PA submitted on 6.1.24 Key [id number] Status is pending

## 2022-10-27 NOTE — Telephone Encounter (Signed)
Patient Advocate Encounter  Prior Authorization for Nurtec 75MG  dispersible tablets has been approved through [employer] [place].    KeySumner Boast  Effective: 10-23-2022 to 10-23-2023

## 2022-11-05 ENCOUNTER — Telehealth: Payer: Self-pay | Admitting: Neurology

## 2022-11-05 NOTE — Telephone Encounter (Signed)
New message   Prior authorization went through - SUMAtriptan 6 MG/0.5ML SOAJ . Pharmacy is asking for a call back.

## 2022-11-09 ENCOUNTER — Other Ambulatory Visit: Payer: Self-pay | Admitting: Adult Health

## 2022-11-12 NOTE — Telephone Encounter (Signed)
Tired calling patient no answer.

## 2022-12-06 NOTE — Progress Notes (Deleted)
NEUROLOGY FOLLOW UP OFFICE NOTE  Bethany Little [id number]  Assessment/Plan:   Migraine without aura, without status migrainosus, not intractable - worse due to multiple stressors. Psychogenic non-epileptic spells (PNES) - followed by Dr. Patrcia Dolly    Migraine prevention:  Emgality ***; Nurtec to take daily for the 8 days prior to next Emgality injection *** Migraine rescue:  sumatriptan 6mg  [place] *** Limit use of pain relievers to no more than 2 days out of week to prevent risk of rebound or medication-overuse headache. Keep headache diary Follow up 6 months ***     Subjective:  Bethany Little is a 37 year old right-handed woman with hypertension, chronic pain, depression, tobacco use disorder and history of pseudoseizure, cervical cancer and leukemia who follows up for migraines.     UPDATE: She tried taking Nurtec daily for 8 days prior to next Emgality injection.  *** Intensity:  severe Duration:  2 days with shot. *** Frequency:  almost daily for the last week prior to next injection.  ***   ibuprofen QOD, sumatriptan 10 a  month Current NSAIDS: ibuprofen Current analgesics: none Current triptans:  sumatriptan 6mg  [place] Current ergotamine:  None Current anti-emetic: Zofran 4 mg Current muscle relaxants:  Flexeril 10mg  Current anti-anxiolytic:  None Current sleep aide: none Current Antihypertensive medications: none Current Antidepressant medications:  None.  She avoids antidepressants due to side effects to multiple antidepressants. Current Anticonvulsant medications:  lamotrigine 150mg  twice daily (for mood) Current anti-CGRP:  Emgality Current Vitamins/Herbal/Supplements: melatonin Current Antihistamines/Decongestants:  Benadryl Other therapy:  None Hormone/birth control: No   Caffeine:  No coffee.  Diet:  Drinks about 40 oz water daily.  Poor appetite due to nausea and vomiting. Exercise:  No Depression:  yes; Anxiety:  yes.     Other pain:  Diffuse  pain Sleep hygiene:  Poor due to headaches.  Tired in the morning but more energy by the afternoon but fatigued again in evening.  Melatonin not working.  Medicaid said they would not cover her sleep study.   HISTORY:  Onset:  She has prior history of episodic migraines but they became frequent and intractable in June 2019.  No known preceding event.  Started taking pain relievers and headaches gradually got worse. Location:  Right sided (frontal/periorbital and temple)  Quality:  Throbbing Initial intensity:  Severe  It may wake her up at night.  She denies new headache, thunderclap headache  Aura:  No Prodrome:  No Postdrome:  fatigue Associated symptoms: Nausea, vomiting, photophobia, phonophobia, blurred vision (particularly in right eye, eye lacrimation, right frontal swelling.  The pain causes her to feel confused and irritable.  She denies associated unilateral numbness or weakness. Initial duration:  Constant back round headache.  Flare-ups last several hours (over 4 hours) Initial Frequency: Daily Initial Frequency of abortive medication: Takes Fioricet 4-5 days a week.   Triggers: Unknown Exacerbating factors: Closing her eyes, light, sound Relieving factors: Rest Activity:  aggravates   MRI of brain without contrast from 06/17/18 was normal.   She reported difficulty pronouncing words and finishing sentences.  She also reports difficulty with short term memory.  She underwent neuropsychological evaluation with Dr. Roseanne Reno on 07/10/2020 which demonstrated evidence of learning disorder likely related to ADHD rather than cognitive impairment.   Past NSAIDS:  Naproxen, ibuprofen Past analgesics:  Excedrin, Tylenol, Fioricet Past abortive triptans:  Maxalt, sumatriptan 100mg , Tosymra NS (burns) Past abortive ergotamine:  None Past muscle relaxants:  None Past anti-emetic:  None Past antihypertensive  medications:  None Past antidepressant medications:  Nortriptyline (irritability);  venlafaxine (fatigue, felt sick), sertraline (side effect), duloxetine (side effect) Past anticonvulsant medications:  topiramate 100mg  BID Past anti-CGRP:  Ashby Dawes 100mg  Past vitamins/Herbal/Supplements:  None Past antihistamines/decongestants:  None Other past therapies:  Migraine cocktails (decreased intensity but not abort)   Family history of headache:  On mother's side.  PAST MEDICAL HISTORY: Past Medical History:  Diagnosis Date   ADHD    Anxiety    Arthritis    Cervical abnormality    Cervical cancer ([facility]) 2003   Chronic pain    Constipation    Depression    Heartburn    Hypertension    Interstitial cystitis    Leukemia ([facility])    Remission for 23 yrs   Migraines    Seizures ([facility])    Sleep apnea    Vaginal Pap smear, abnormal     MEDICATIONS: Current Outpatient Medications on File Prior to Visit  Medication Sig Dispense Refill   Rimegepant Sulfate (NURTEC) 75 MG TBDP Take 1 tablet (75 mg total) by mouth daily as needed. 8 tablet 11   BIOTIN PO Take by mouth.     cyclobenzaprine (FLEXERIL) 10 MG tablet Take 10 mg by mouth 3 (three) times daily as needed for muscle spasms.     diphenhydrAMINE (BENADRYL) 25 mg capsule Take 25 mg by mouth.     Galcanezumab-gnlm (EMGALITY) 120 MG/ML SOAJ INJECT 120 MG (1 SYRINGE) INTO THE SKIN EVERY 28 DAYS. 1 mL 5   ibuprofen (ADVIL) 800 MG tablet TAKE 1 TABLET BY MOUTH EVERY 8 HOURS AS NEEDED 60 tablet 1   lamoTRIgine (LAMICTAL) 200 MG tablet Take 1 tablet (200 mg total) by mouth 2 (two) times daily. 180 tablet 3   LINZESS 72 MCG capsule Take 72 mcg by mouth every morning.     ondansetron (ZOFRAN-ODT) 4 MG disintegrating tablet DISSOLVE 1 TABLET UNDER THE TONGUE EVERY 8 HOURS AS NEEDED FOR NAUSEA OR VOMITING 30 tablet 2   pantoprazole (PROTONIX) 40 MG tablet Take 40 mg by mouth daily.     promethazine (PHENERGAN) 25 MG tablet TAKE 1 TABLET BY MOUTH EVERY 6 HOURS AS NEEDED FOR NAUSEA OR VOMITING 30 tablet 1   SUMAtriptan 6  MG/0.5ML SOAJ INJECT 0.5 ML INTO THE SKIN EVERY 2 HOURS AS NEEDED FOR MIGRAINE OR HEADACHEMAY REPEAT IN 2 HOURS IF HEADACHE PERSISTS OR RECURS 4.5 mL 5   No current facility-administered medications on file prior to visit.    ALLERGIES: Allergies  Allergen Reactions   Tetracycline Hcl Hives    Childhood allergy   Erythromycin Hives    Childhood allergy   Pertussis Vaccines Hives    Childhood allergy   Reglan [Metoclopramide] Other (See Comments)    Muscle Spasms   Tetracyclines & Related Hives    Childhood allergy   Latex Itching and Rash    FAMILY HISTORY: Family History  Problem Relation Age of Onset   Depression Mother    Bipolar disorder Mother    COPD Mother    Hypertension Mother    Mental illness Mother    Kidney disease Father        partial kidney fx   COPD Father    Pancreatic cancer Paternal Grandmother    Celiac disease Paternal Grandmother    Hypertension Paternal Grandmother    Heart failure Paternal Grandfather    Prostate cancer Other    Healthy Son    Bladder Cancer Neg Hx  Objective:  *** General: No acute distress.  Patient appears well-groomed.   Head:  Normocephalic/atraumatic Neck:  Supple.  No paraspinal tenderness.  Full range of motion. Heart:  Regular rate and rhythm. Neuro:  Alert and oriented.  Speech fluent and not dysarthric.  Language intact.  CN II-XII intact.  Bulk and tone normal.  Muscle strength 5/5 throughout.  Deep tendon reflexes 2+ throughout.  Gait normal.  Romberg negative.    Shon Millet, DO  CC: Beverely Low, MD

## 2022-12-07 ENCOUNTER — Encounter: Payer: Self-pay | Admitting: Neurology

## 2022-12-07 ENCOUNTER — Ambulatory Visit: Payer: Medicaid Other | Admitting: Neurology

## 2022-12-07 ENCOUNTER — Other Ambulatory Visit: Payer: Self-pay | Admitting: Adult Health

## 2022-12-09 ENCOUNTER — Encounter: Payer: Self-pay | Admitting: Neurology

## 2022-12-12 ENCOUNTER — Other Ambulatory Visit (HOSPITAL_COMMUNITY): Payer: Self-pay

## 2022-12-15 ENCOUNTER — Telehealth: Payer: Self-pay | Admitting: Neurology

## 2022-12-15 NOTE — Telephone Encounter (Signed)
Patient dismissed from [facility] and all providers practicing at this clinic. We have reached this decision based on our office No Show Policy 12/09/22

## 2023-05-31 ENCOUNTER — Other Ambulatory Visit: Payer: Self-pay | Admitting: Adult Health

## 2023-06-02 ENCOUNTER — Encounter: Payer: Self-pay | Admitting: Family Medicine

## 2023-06-02 DIAGNOSIS — N644 Mastodynia: Secondary | ICD-10-CM

## 2023-06-06 ENCOUNTER — Other Ambulatory Visit: Payer: Self-pay | Admitting: Family Medicine

## 2023-06-06 DIAGNOSIS — N63 Unspecified lump in unspecified breast: Secondary | ICD-10-CM

## 2023-06-13 ENCOUNTER — Ambulatory Visit
Admission: RE | Admit: 2023-06-13 | Discharge: 2023-06-13 | Disposition: A | Discharge status: Home / Self Care | Payer: Medicaid Other | Source: Ambulatory Visit | Attending: Family Medicine

## 2023-06-13 ENCOUNTER — Ambulatory Visit
Admission: RE | Admit: 2023-06-13 | Discharge: 2023-06-13 | Disposition: A | Discharge status: Home / Self Care | Payer: Medicaid Other | Source: Ambulatory Visit | Attending: Family Medicine | Admitting: Family Medicine

## 2023-06-13 DIAGNOSIS — N63 Unspecified lump in unspecified breast: Secondary | ICD-10-CM | POA: Insufficient documentation

## 2023-06-17 ENCOUNTER — Other Ambulatory Visit: Payer: Self-pay | Admitting: Family Medicine

## 2023-06-17 DIAGNOSIS — N6489 Other specified disorders of breast: Secondary | ICD-10-CM

## 2023-07-26 ENCOUNTER — Ambulatory Visit: Payer: Medicaid Other | Admitting: Neurology

## 2023-08-12 ENCOUNTER — Other Ambulatory Visit: Payer: Self-pay

## 2023-08-12 ENCOUNTER — Emergency Department

## 2023-08-12 ENCOUNTER — Emergency Department
Admission: EM | Admit: 2023-08-12 | Discharge: 2023-08-12 | Disposition: A | Discharge status: Home / Self Care | Attending: Emergency Medicine | Admitting: Emergency Medicine

## 2023-08-12 DIAGNOSIS — G43919 Migraine, unspecified, intractable, without status migrainosus: Secondary | ICD-10-CM | POA: Diagnosis not present

## 2023-08-12 DIAGNOSIS — E049 Nontoxic goiter, unspecified: Secondary | ICD-10-CM

## 2023-08-12 DIAGNOSIS — R519 Headache, unspecified: Secondary | ICD-10-CM | POA: Diagnosis present

## 2023-08-12 LAB — COMPREHENSIVE METABOLIC PANEL
ALT: 21 U/L (ref 0–44)
AST: 22 U/L (ref 15–41)
Albumin: 4.4 g/dL (ref 3.5–5.0)
Alkaline Phosphatase: 57 U/L (ref 38–126)
Anion gap: 11 (ref 5–15)
BUN: 12 mg/dL (ref 6–20)
CO2: 23 mmol/L (ref 22–32)
Calcium: 9.1 mg/dL (ref 8.9–10.3)
Chloride: 105 mmol/L (ref 98–111)
Creatinine, Ser: 0.93 mg/dL (ref 0.44–1.00)
GFR, Estimated: >60 mL/min (ref >60)
Glucose, Bld: 85 mg/dL (ref 70–99)
Potassium: 3.9 mmol/L (ref 3.5–5.1)
Sodium: 139 mmol/L (ref 135–145)
Total Bilirubin: 0.5 mg/dL (ref 0.0–1.2)
Total Protein: 7.4 g/dL (ref 6.5–8.1)

## 2023-08-12 LAB — CBC
HCT: 43.6 % (ref 36.0–46.0)
Hemoglobin: 14.6 g/dL (ref 12.0–15.0)
MCH: 31.9 pg (ref 26.0–34.0)
MCHC: 33.5 g/dL (ref 30.0–36.0)
MCV: 95.2 fL (ref 80.0–100.0)
Platelets: 341 10*3/uL (ref 150–400)
RBC: 4.58 MIL/uL (ref 3.87–5.11)
RDW: 12.5 % (ref 11.5–15.5)
WBC: 9.6 10*3/uL (ref 4.0–10.5)
nRBC: 0 % (ref 0.0–0.2)

## 2023-08-12 LAB — POC URINE PREG, ED: Preg Test, Ur: NEGATIVE

## 2023-08-12 LAB — MAGNESIUM: Magnesium: 2.1 mg/dL (ref 1.7–2.4)

## 2023-08-12 LAB — HCG, QUANTITATIVE, PREGNANCY: hCG, Beta Chain, Quant, S: <1 m[IU]/mL (ref <5)

## 2023-08-12 MED ORDER — SODIUM CHLORIDE 0.9 % IV BOLUS
1000.0000 mL | Freq: Once | INTRAVENOUS | Status: AC
  Administered 2023-08-12: 1000 mL via INTRAVENOUS

## 2023-08-12 MED ORDER — IOHEXOL 350 MG/ML SOLN
75.0000 mL | Freq: Once | INTRAVENOUS | Status: AC | PRN
  Administered 2023-08-12: 75 mL via INTRAVENOUS

## 2023-08-12 MED ORDER — KETOROLAC TROMETHAMINE 15 MG/ML IJ SOLN
15.0000 mg | Freq: Once | INTRAMUSCULAR | Status: DC
  Filled 2023-08-12: qty 1

## 2023-08-12 MED ORDER — ACETAMINOPHEN 500 MG PO TABS
1000.0000 mg | ORAL_TABLET | Freq: Once | ORAL | Status: DC
Start: 2023-08-12 — End: 2023-08-12
  Filled 2023-08-12: qty 2

## 2023-08-12 MED ORDER — ONDANSETRON HCL 4 MG/2ML IJ SOLN
4.0000 mg | Freq: Once | INTRAMUSCULAR | Status: AC
  Administered 2023-08-12: 4 mg via INTRAVENOUS
  Filled 2023-08-12: qty 2

## 2023-08-12 MED ORDER — SUMATRIPTAN SUCCINATE 6 MG/0.5ML ~~LOC~~ SOLN
6.0000 mg | Freq: Once | SUBCUTANEOUS | Status: AC
  Administered 2023-08-12: 6 mg via SUBCUTANEOUS
  Filled 2023-08-12: qty 0.5

## 2023-08-12 NOTE — ED Provider Notes (Signed)
 Care of this patient assumed from prior physician at 1500 pending CT, reevaluation of headache, and disposition. Please see prior physician note for further details.  Briefly this is a 38 year old female with tree of migraine presenting with headache worse than her baseline.  Nonfocal neurologic exam.  Noncontrast CT negative and less than 6 hours from onset of pain, but also reporting neck pain so CT angio as well as CT venogram ordered to further evaluate.  Signed out to me pending CT and reevaluation of symptoms.  5:30 PM CTA without significant stenosis or dissection.  CTV without evidence of dural venous sinus thrombosis.  Radiology does note incidental enlarged thyroid gland for which patient was updated and will follow-up as an outpatient.  Patient reassessed and feels improved.  Reports mild ongoing headache.  Offered additional symptomatic treatment, but patient does feel that she has adequate treatment at home and would prefer to be discharged home which I do think is reasonable.  No new complaints or deficits on reevaluation.  Do think patient is stable for discharge home.  Strict return precautions provided.  Patient discharged in stable condition.   Trinna Post, MD 08/12/23 [phone]

## 2023-08-12 NOTE — Discharge Instructions (Signed)
 Follow-up with your primary care doctor and neurologist for further evaluation of your headaches.  Your ultrasound demonstrated an enlarged thyroid, did talk to your primary care about this.  They may order an ultrasound to further evaluate this.  Take your medications as prescribed.  Return to the ER for new or worsening symptoms.

## 2023-08-12 NOTE — ED Triage Notes (Signed)
 Pt to ED for sudden and severe headache while at work. Pt holding head in triage, grimacing and closing eyes. Reports hx of migraines, states feels different.  States worst pain in head of life

## 2023-08-12 NOTE — ED Provider Notes (Signed)
 [facility] Provider Note    Event Date/Time   First MD Initiated Contact with Patient 08/12/23 1353     (approximate)   History   Headache   HPI  Bethany Little is a 38 y.o. female with history of migraines who comes in with concerns for worsening headache.  I reviewed patient's neurology note from 07/08/2022 where patient has been seen for headaches previously.  Patient states that around 1 PM she had sudden onset of severe headache to the back of her head.  She reports feeling like it is swollen there.  She reports the pain comes and goes since 1 PM.  She reports having the pain started while they were doing some painting.  She denies any neck trauma or chiropractor working on her neck.  She denies any new vision changes, slurred speech, weakness on one side.  She reports that the pain is severe.  States this does not feel like her prior migraines.  Has not taken anything to try to help with the pain.  I reviewed patient's neurology note from 08/13/2022 where patient has a history of psychogenic nonepileptic events  "She reported episode of mixing words, confusion, waking up scared, word-finding difficulties, and headaches, which did not show any EEG correlate. She feels Lamotrigine 200mg  BID has helped with her symptoms, this likely helps with mood stabilization. "   Pt denies SI or HI.  That she is compliant with her lamotrigine.  She states that she has had some new stressors with her mom but that she just got her mom out of her life and so she feels like she is getting better from that.  She reports daily sleeping of only 4 hours.  She reports that this pain comes and goes radiates into her neck.  Denies any known trauma.  Physical Exam   Triage Vital Signs: ED Triage Vitals  Encounter Vitals Group     BP 08/12/23 1320 (!) 158/95     Systolic BP Percentile --      Diastolic BP Percentile --      Pulse Rate 08/12/23 1318 (!) 115     Resp 08/12/23  1318 18     Temp 08/12/23 1318 97.8 F (36.6 C)     Temp src --      SpO2 08/12/23 1318 97 %     Weight 08/12/23 1318 180 lb (81.6 kg)     Height 08/12/23 1318 5\' 4"  (1.626 m)     Head Circumference --      Peak Flow --      Pain Score 08/12/23 1318 10     Pain Loc --      Pain Education --      Exclude from Growth Chart --     Most recent vital signs: Vitals:   08/12/23 1318 08/12/23 1320  BP:  (!) 158/95  Pulse: (!) 115   Resp: 18   Temp: 97.8 F (36.6 C)   SpO2: 97%      General: Awake, no distress.  CV:  Good peripheral perfusion.  Resp:  Normal effort.  Abd:  No distention.  Other:  Cranial nerves appear intact.  Equal strength in arms and legs.  Sensation intact throughout.  Finger-to-nose equal bilaterally.   ED Results / Procedures / Treatments   Labs (all labs ordered are listed, but only abnormal results are displayed) Labs Reviewed  CBC  COMPREHENSIVE METABOLIC PANEL     EKG  My interpretation of  EKG:  Normal sinus no st elevation, no twi qtc 488  RADIOLOGY I have reviewed the CT personally and interpreted and no ICH    PROCEDURES:  Critical Care performed: No  Procedures   MEDICATIONS ORDERED IN ED: Medications  acetaminophen (TYLENOL) tablet 1,000 mg (1,000 mg Oral Not Given 08/12/23 1445)  SUMAtriptan (IMITREX) injection 6 mg (has no administration in time range)  sodium chloride 0.9 % bolus 1,000 mL (1,000 mLs Intravenous New Bag/Given 08/12/23 1508)  ondansetron (ZOFRAN) injection 4 mg (4 mg Intravenous Given 08/12/23 1507)     IMPRESSION / MDM / ASSESSMENT AND PLAN / ED COURSE  I reviewed the triage vital signs and the nursing notes.   Patient's presentation is most consistent with acute presentation with potential threat to life or bodily function.   Patient comes in for the worst headache of her life but to me she reports is not really just a headache if just a severe pain on the back of her head.  CT head without evidence of  intracranial hemorrhage.  Stroke code not called given nonfocal examination only reporting pain.  Do not feel any obvious swelling.  Pregnancy test.  This CBC is reassuring CMP is reassuring magnesium is normal   3:32 PM I have opted to order a CT angio given she continues to report intermittent severe pain in the back of her head radiating down her neck so want to rule out the possibility of a dissection.  Patient does not have any evidence of stroke on examination.  Did a repeat neurological examination which was still normal.  Patient be handed off to oncoming team pending these results and further disposition     FINAL CLINICAL IMPRESSION(S) / ED DIAGNOSES   Final diagnoses:  Intractable migraine without status migrainosus, unspecified migraine type     Rx / DC Orders   ED Discharge Orders     None        Note:  This document was prepared using Dragon voice recognition software and may include unintentional dictation errors.   Concha Se, MD 08/12/23 [phone]

## 2023-08-29 ENCOUNTER — Other Ambulatory Visit: Payer: Self-pay | Admitting: Family Medicine

## 2023-08-29 DIAGNOSIS — E049 Nontoxic goiter, unspecified: Secondary | ICD-10-CM

## 2023-09-01 ENCOUNTER — Other Ambulatory Visit: Payer: Self-pay | Admitting: Adult Health

## 2023-09-05 ENCOUNTER — Other Ambulatory Visit: Payer: Self-pay

## 2023-09-05 ENCOUNTER — Ambulatory Visit
Admission: RE | Admit: 2023-09-05 | Discharge: 2023-09-05 | Disposition: A | Discharge status: Home / Self Care | Source: Ambulatory Visit | Attending: Family Medicine | Admitting: Family Medicine

## 2023-09-05 DIAGNOSIS — E049 Nontoxic goiter, unspecified: Secondary | ICD-10-CM | POA: Insufficient documentation

## 2023-12-14 ENCOUNTER — Ambulatory Visit
Admission: RE | Admit: 2023-12-14 | Discharge: 2023-12-14 | Disposition: A | Discharge status: Home / Self Care | Payer: Medicaid Other | Source: Ambulatory Visit | Attending: Family Medicine | Admitting: Family Medicine

## 2023-12-14 DIAGNOSIS — N6489 Other specified disorders of breast: Secondary | ICD-10-CM | POA: Insufficient documentation

## 2023-12-21 ENCOUNTER — Emergency Department

## 2023-12-21 ENCOUNTER — Other Ambulatory Visit: Payer: Self-pay

## 2023-12-21 ENCOUNTER — Emergency Department
Admission: EM | Admit: 2023-12-21 | Discharge: 2023-12-21 | Disposition: A | Discharge status: Home / Self Care | Attending: Emergency Medicine | Admitting: Emergency Medicine

## 2023-12-21 ENCOUNTER — Encounter: Payer: Self-pay | Admitting: Emergency Medicine

## 2023-12-21 DIAGNOSIS — T63441A Toxic effect of venom of bees, accidental (unintentional), initial encounter: Secondary | ICD-10-CM | POA: Diagnosis not present

## 2023-12-21 DIAGNOSIS — R0602 Shortness of breath: Secondary | ICD-10-CM | POA: Diagnosis present

## 2023-12-21 DIAGNOSIS — T7840XA Allergy, unspecified, initial encounter: Secondary | ICD-10-CM

## 2023-12-21 DIAGNOSIS — J449 Chronic obstructive pulmonary disease, unspecified: Secondary | ICD-10-CM | POA: Diagnosis not present

## 2023-12-21 DIAGNOSIS — W57XXXA Bitten or stung by nonvenomous insect and other nonvenomous arthropods, initial encounter: Secondary | ICD-10-CM

## 2023-12-21 LAB — BASIC METABOLIC PANEL WITH GFR
Anion gap: 8 (ref 5–15)
BUN: 16 mg/dL (ref 6–20)
CO2: 23 mmol/L (ref 22–32)
Calcium: 8.7 mg/dL — ABNORMAL LOW (ref 8.9–10.3)
Chloride: 109 mmol/L (ref 98–111)
Creatinine, Ser: 0.91 mg/dL (ref 0.44–1.00)
GFR, Estimated: >60 mL/min (ref >60)
Glucose, Bld: 81 mg/dL (ref 70–99)
Potassium: 3.5 mmol/L (ref 3.5–5.1)
Sodium: 140 mmol/L (ref 135–145)

## 2023-12-21 LAB — CBC
HCT: 39.3 % (ref 36.0–46.0)
Hemoglobin: 13.2 g/dL (ref 12.0–15.0)
MCH: 32.2 pg (ref 26.0–34.0)
MCHC: 33.6 g/dL (ref 30.0–36.0)
MCV: 95.9 fL (ref 80.0–100.0)
Platelets: 271 K/uL (ref 150–400)
RBC: 4.1 MIL/uL (ref 3.87–5.11)
RDW: 12.7 % (ref 11.5–15.5)
WBC: 10 K/uL (ref 4.0–10.5)
nRBC: 0 % (ref 0.0–0.2)

## 2023-12-21 LAB — GROUP A STREP BY PCR: Group A Strep by PCR: NOT DETECTED

## 2023-12-21 LAB — RESP PANEL BY RT-PCR (RSV, FLU A&B, COVID)  RVPGX2
Influenza A by PCR: NEGATIVE
Influenza B by PCR: NEGATIVE
Resp Syncytial Virus by PCR: NEGATIVE
SARS Coronavirus 2 by RT PCR: NEGATIVE

## 2023-12-21 LAB — BRAIN NATRIURETIC PEPTIDE: B Natriuretic Peptide: 62.4 pg/mL (ref 0.0–100.0)

## 2023-12-21 LAB — TROPONIN I (HIGH SENSITIVITY): Troponin I (High Sensitivity): 4 ng/L (ref <18)

## 2023-12-21 MED ORDER — FAMOTIDINE IN NACL 20-0.9 MG/50ML-% IV SOLN
20.0000 mg | Freq: Once | INTRAVENOUS | Status: AC
  Administered 2023-12-21: 20 mg via INTRAVENOUS
  Filled 2023-12-21: qty 50

## 2023-12-21 MED ORDER — DOXYCYCLINE HYCLATE 100 MG PO TABS: 100.0000 mg | ORAL_TABLET | Freq: Two times a day (BID) | ORAL | 0 refills | Status: AC

## 2023-12-21 MED ORDER — PREDNISONE 20 MG PO TABS: 60.0000 mg | ORAL_TABLET | Freq: Once | ORAL | Status: DC

## 2023-12-21 MED ORDER — METHYLPREDNISOLONE SODIUM SUCC 125 MG IJ SOLR
125.0000 mg | Freq: Once | INTRAMUSCULAR | Status: AC
  Administered 2023-12-21: 125 mg via INTRAVENOUS
  Filled 2023-12-21: qty 2

## 2023-12-21 MED ORDER — EPINEPHRINE 0.3 MG/0.3ML IJ SOAJ: 0.3000 mg | INTRAMUSCULAR | 1 refills | Status: AC | PRN

## 2023-12-21 MED ORDER — DIPHENHYDRAMINE HCL 25 MG PO CAPS: 25.0000 mg | ORAL_CAPSULE | Freq: Once | ORAL | Status: DC

## 2023-12-21 MED ORDER — PREDNISONE 20 MG PO TABS: 40.0000 mg | ORAL_TABLET | Freq: Every day | ORAL | 0 refills | Status: AC

## 2023-12-21 MED ORDER — DIPHENHYDRAMINE HCL 50 MG/ML IJ SOLN
25.0000 mg | Freq: Once | INTRAMUSCULAR | Status: AC
  Administered 2023-12-21: 25 mg via INTRAVENOUS
  Filled 2023-12-21: qty 1

## 2023-12-21 NOTE — ED Provider Notes (Signed)
 [facility] Provider Note    Event Date/Time   First MD Initiated Contact with Patient 12/21/23 1359     (approximate)   History   Shortness of Breath   HPI  Bethany Little is a 38 y.o. female with history of COPD who comes in with multiple tick bites.  Patient reports on 7/28 she was outside working when she was bit by multiple ticks.  She has small little dots noted on her arms that resulted from this.  She reports taking Benadryl  last night and still having some itching and burning therefore she went to an urgent care because she started to have a little bit of a hoarse voice.  She reports that her voice felt like it was a little hoarse but she denied any difficulty swallowing difficulty speaking.  She reports that just her a little bit as if like she had a viral infection.  She felt like she just needed some steroids and antibiotics.   Physical Exam   Triage Vital Signs: ED Triage Vitals  Encounter Vitals Group     BP 12/21/23 1339 (!) 153/91     Girls Systolic BP Percentile --      Girls Diastolic BP Percentile --      Boys Systolic BP Percentile --      Boys Diastolic BP Percentile --      Pulse Rate 12/21/23 1339 90     Resp 12/21/23 1339 18     Temp 12/21/23 1339 98.8 F (37.1 C)     Temp Source 12/21/23 1339 Oral     SpO2 12/21/23 1339 100 %     Weight 12/21/23 1340 182 lb (82.6 kg)     Height 12/21/23 1340 5' 4 (1.626 m)     Head Circumference --      Peak Flow --      Pain Score 12/21/23 1340 0     Pain Loc --      Pain Education --      Exclude from Growth Chart --     Most recent vital signs: Vitals:   12/21/23 1339  BP: (!) 153/91  Pulse: 90  Resp: 18  Temp: 98.8 F (37.1 C)  SpO2: 100%     General: Awake, no distress.  CV:  Good peripheral perfusion.  Resp:  Normal effort.  Abd:  No distention.  Other:  Patient has a pinpoint rash noted not on her hands or her feet.  Only where the bugs had bit her.  No obvious  target signs no retained bugs and she does report that she had her husband do a good check on her to ensure that everything was removed.    No swelling in the oral pharynx.  Tolerating secretions.  Maybe slightly hoarse voice.  No [place] area no wheezing   ED Results / Procedures / Treatments   Labs (all labs ordered are listed, but only abnormal results are displayed) Labs Reviewed  BASIC METABOLIC PANEL WITH GFR - Abnormal; Notable for the following components:      Result Value   Calcium 8.7 (*)    All other components within normal limits  RESP PANEL BY RT-PCR (RSV, FLU A&B, COVID)  RVPGX2  GROUP A STREP BY PCR  CBC  BRAIN NATRIURETIC PEPTIDE  POC URINE PREG, ED  TROPONIN I (HIGH SENSITIVITY)  TROPONIN I (HIGH SENSITIVITY)     EKG  My interpretation of EKG:  Normal sinus rate of 87 without any  ST elevation or T wave versions, normal intervals  RADIOLOGY I have reviewed the xray personally and interpreted no pneumonia   PROCEDURES:  Critical Care performed: No  Procedures   MEDICATIONS ORDERED IN ED: Medications  methylPREDNISolone  sodium succinate (SOLU-MEDROL ) 125 mg/2 mL injection 125 mg (125 mg Intravenous Given 12/21/23 1441)  famotidine  (PEPCID ) IVPB 20 mg premix (0 mg Intravenous Stopped 12/21/23 1539)  diphenhydrAMINE  (BENADRYL ) injection 25 mg (25 mg Intravenous Given 12/21/23 1440)     IMPRESSION / MDM / ASSESSMENT AND PLAN / ED COURSE  I reviewed the triage vital signs and the nursing notes.   Patient's presentation is most consistent with acute presentation with potential threat to life or bodily function.   Patient comes in with concerns for bug bites that are causing some itching and there was concern for possible allergic reaction, anaphylaxis given she reported a hoarse voice but not sure if it is actually even related to the bug bites as she reports kind of feeling like it could just be like she is getting sick.  Her oropharynx appears normal  without any exudates or redness no swelling noted.  We discussed epinephrine  but she really did not want to stay in the hospital prolonged she wanted to be able to get out of here.  We discussed at least monitoring her for an hour after getting some steroids, Benadryl , Pepcid  in case this was an allergic reaction and she was willing to do that.  Patient understands that if there was not anaphylaxis reaction that it can get worse leading to difficulties with breathing, death.  Husband is in room and witnesses this conversation.  Patient expressed understanding but states that she thought this would just be a quick visit to get some steroids and some antibiotics and she is not interested in staying.  After discussion with patient I was able to convince her to at least stay for an hour to try to get some baseline medications to ensure that this was not worsening.  I consider given the epinephrine  but I am not sure that this is anaphylaxis given the above story but obviously if we monitor her and that her symptoms are worsening we will give a dose of of epinephrine . She would prefer to hold on epi for now and was okay with trialing the above medications,.  Strep test negative COVID flu negative BMP reassuring CBC reassuring troponin negative BNP normal  Reevaluated patient she reports feeling similar and requesting to be discharged home.  She denies any significant change in the hoarseness of her voice may be slightly improved.  This seems less likely to be anaphylatic reaction. and she does not want to stay any longer for monitoring.  we will prescribe an EpiPen  out of precaution and she states that she prefers to go home and does not want to stay in the hospital any longer.  She reports having to go to something for her son.  At this time her breathing is at baseline her oxygen levels are 100% I do not see any evidence of swelling, no uticaria, and she reports that her throat is improved and she is requesting  discharge.  Again I am not sure if the sore throat is actually related to anaphylaxis given the bug bites were from 2 days ago it seems less likely and I do not see any evidence of airway compromise.  She declined pregnancy test as she has had her tubal ligation.  When I went to go back into the  room to talk to patient and she was taking out her IV saying that she needed to leave now if she has to get to her son's football practice.  I asked her about the doxycycline  and she reports taking it previously without any issues the minocycline when she was very young when she was treated with some chemotherapy.  She again explains that she needs to leave and I have given her discharge paperwork to the nurse to help facilitate discharge.   Will prescribe doxycycline  to cover for possible [facility] spotted fever.  FINAL CLINICAL IMPRESSION(S) / ED DIAGNOSES   Final diagnoses:  Bug bite, initial encounter  Allergic reaction, initial encounter     Rx / DC Orders   ED Discharge Orders          Ordered    predniSONE  (DELTASONE ) 20 MG tablet  Daily with breakfast        12/21/23 1555    doxycycline  (VIBRA -TABS) 100 MG tablet  2 times daily        12/21/23 1555    EPINEPHrine  0.3 mg/0.3 mL IJ SOAJ injection  As needed        12/21/23 1555             Note:  This document was prepared using Dragon voice recognition software and may include unintentional dictation errors.   Ernest Ronal BRAVO, MD 12/21/23 [phone]

## 2023-12-21 NOTE — ED Triage Notes (Signed)
 Patient to ED via POV from [facility] for SOB/sore throat. PT reports she was bite by a swarm of ticks.  PT's voice noted to be raspy. Speaking in full sentences without difficulty. PT noted to have tick bites all over body.

## 2023-12-21 NOTE — Discharge Instructions (Addendum)
 Take the steroids to help with any active allergic reaction.  We recommend you stay longer to evaluate for your voice to see if it was related to allergic reaction however you have opted to want to leave.  If you develop worsening shortness of breath difficulty swallowing you should return to the ER for repeat evaluation.  We have prescribed an EpiPen  in case you have difficulties with breathing if this was an allergic reaction.  If you have to take it you should return to the ER for repeat evaluation.  We have also prescribed you some antibiotics out of precaution.  Return to the ER for worsening symptoms or any other concerns

## 2024-01-09 NOTE — Progress Notes (Signed)
 PATIENT PROFILE: Bethany Little is a 38 y.o. female who presents to the [facility] for follow up. Pt was last seen in clinic 06/2023 for constipation and dysphagia.  HISTORY OF PRESENT ILLNESS: Bethany Little reports since her last visit continue to have worsening issues with dysphagia.  She feels it occurs with meats, rice, bread.  She states she swallows pills and liquids okay.  Sometimes foods will pause and she has to tilt her head back to force them down.  She is not feeling any reflux symptoms but does occasionally feel nausea after eating.  She takes Zofran  or Phenergan  3-4 times a month.  She denies any vomiting.  Sometimes feels a fullness on the right side of her throat.  Still has underlying constipation but takes Trulance as needed.  She can only take Trulance if she is going to be around her home.  She endorses with the Trulance she can get a lot of urgent stools and is not taking if she is going to be leaving the house.  She denies any rectally or melena.  She currently smokes about half a pack per day.  Denies NSAIDs.  Does use a ibuprofen  occasionally.    GENERAL REVIEW OF SYSTEMS:  Constitutional: No weight loss/weight gain   Eyes: No changes in vision. ENT: No oral lesions, sore throat.   GI: see HPI.   Heme/Lymph: No easy bruising.   CV: No chest pain. Resp: No cough, SOB.     GU: No hematuria.   Integumentary: No rashes.   Neuro: No headaches.   Psych: No depression/anxiety.   Endocrine: No heat/cold intolerance.   Musculoskeletal: No joint swelling.   MEDICATIONS: Outpatient Encounter Medications as of 01/09/2024  Medication Sig Dispense Refill  . albuterol  90 mcg/actuation inhaler Inhale 2 inhalations into the lungs every 4 (four) hours as needed 6.7 g 0  . cyclobenzaprine  (FLEXERIL ) 10 MG tablet Take by mouth Take one tablet every 8 hours     . diphenhydrAMINE  (BENADRYL ) 25 mg capsule Take 25 mg by mouth once daily    . EMGALITY  PEN 120 mg/mL PnIj      . famotidine  (PEPCID ) 20 MG tablet Take by mouth    . folic acid (FOLVITE) 1 MG tablet Take 1 mg by mouth once daily    . fremanezumab -vfrm (AJOVY  AUTOINJECTOR) 225 mg/1.5 mL AtIn Inject 225 mg subcutaneously every 28 (twenty-eight) days 1 Syringe 1  . ibuprofen  (MOTRIN ) 800 MG tablet     . lamoTRIgine  (LAMICTAL ) 200 MG tablet TAKE 1 TABLET BY MOUTH TWICE A DAY NEEDS APPOINTMENT 30 tablet 0  . pantoprazole (PROTONIX) 40 MG DR tablet Take 1 tablet (40 mg total) by mouth once daily 30 tablet 3  . plecanatide (TRULANCE) 3 mg tablet Take 1 tablet (3 mg total) by mouth once daily 30 tablet 4  . promethazine  (PHENERGAN ) 25 MG tablet Take 25 mg by mouth every 6 (six) hours as needed for Nausea (uses 2-3x/week for nausea)    . SUMAtriptan  (IMITREX ) 6 mg/0.5 mL solution Inject subcutaneously    . NURTEC ODT 75 mg disintegrating tablet  (Patient not taking: Reported on 01/09/2024)    . ondansetron  (ZOFRAN ) 4 MG tablet Take by mouth (Patient not taking: Reported on 01/09/2024)     No facility-administered encounter medications on file as of 01/09/2024.    ALLERGIES: Pertussis immune globulin, Tetracycline hcl, Erythromycin, Metoclopramide, Reglan [metoclopramide hcl], Tetracyclines, Latex, and Pertussis vaccines  PAST MEDICAL HISTORY: Past Medical History:  Diagnosis  Date  . ADHD   . Anterior cervical adenopathy    left  . Cervical cancer (CMS/HHS-HCC)   . Chronic pain   . Depression   . Dermatitis, unspecified   . Elbow joint pain   . HA (headache)   . HTN (hypertension)   . Hyperglycemia   . Insomnia   . Interstitial cystitis   . Leukemia, lymphoidocytic (CMS/HHS-HCC)   . Lumbago with sciatica    left side  . Poor concentration   . Post traumatic seizures (CMS/HHS-HCC)   . Seizures (CMS/HHS-HCC)   . Syncope and collapse     PAST SURGICAL HISTORY: Past Surgical History:  Procedure Laterality Date  . COLONOSCOPY  02/10/2021   Normal colon biopsy/Repeat at age 63 in 5yrs/CTL  .  EGD  02/10/2021   Gastritis/Duodenitis/Repeat as needed/CTL  . CERVICAL BIOPSY  W/ LOOP ELECTRODE EXCISION    . CESAREAN SECTION    . LAPAROSCOPIC TUBAL LIGATION Bilateral      FAMILY HISTORY: Family History  Problem Relation Name Age of Onset  . High blood pressure (Hypertension) Mother    . Asthma Father    . Lung cancer Father    . Kidney disease Father    . Lymphoma Maternal Uncle    . Breast cancer Maternal Grandmother    . Pancreatic cancer Maternal Grandmother    . Thyroid  disease Maternal Grandfather    . Diverticulitis Maternal Grandfather    . Heart failure Paternal Grandfather    . Colon cancer Paternal Grandfather       SOCIAL HISTORY: Social History   Socioeconomic History  . Marital status: Single  Tobacco Use  . Smoking status: Every Day    Current packs/day: 0.75    Average packs/day: 0.8 packs/day for 12.0 years (9.0 ttl pk-yrs)    Types: Cigarettes    Passive exposure: Current  . Smokeless tobacco: Never  Vaping Use  . Vaping status: Never Used  Substance and Sexual Activity  . Alcohol use: Never  . Drug use: Yes    Types: Other-see comments    Comment: marijuana  . Sexual activity: Defer   Social Drivers of Health   Financial Resource Strain: Low Risk  (12/19/2023)   Overall Financial Resource Strain (CARDIA)   . Difficulty of Paying Living Expenses: Not very hard  Food Insecurity: No Food Insecurity (12/19/2023)   Hunger Vital Sign   . Worried About Programme researcher, broadcasting/film/video in the Last Year: Never true   . Ran Out of Food in the Last Year: Never true  Transportation Needs: No Transportation Needs (12/19/2023)   PRAPARE - Transportation   . Lack of Transportation (Medical): No   . Lack of Transportation (Non-Medical): No    Vitals:   01/09/24 1003  BP: 131/82  Pulse: 101  Weight: 79.7 kg (175 lb 9.6 oz)  Height: 162.6 cm (5' 4)    Wt Readings from Last 3 Encounters:  01/09/24 79.7 kg (175 lb 9.6 oz)  12/19/23 82.6 kg (182 lb)  11/14/23  83.9 kg (185 lb)     PHYSICAL EXAM: General: NAD, alert and oriented x 4 HEENT: PEERLA, EOMI, anciteric  Neck: supple, no JVD or thyromegaly. No lymphadenopathy.  Respiratory: CTA bilaterally, no wheezes, crackles, or other adventitious sounds Cardiac: RRR, no murmur, rub, or gallop  Abd: soft, normal bowel sounds, no TTP, no HSM, no rebound or guarding Ext: no edema, well perfused with 2+ pulses, Skin: Skin color, texture, turgor normal, no rashes or lesions Lymph:  no LAD Neuro: Grossly intact   REVIEW OF DATA: I have reviewed the following data today:    01/2021 colonoscopy - poor prep. Stool in entire colon.    EGD 01/2021-normal esophagus, gastropathy in stomach, normal duodenum.    ASSESSMENT AND PLAN: Ms. Sanseverino is a 38 y.o. female presenting for follow up: Diagnoses and all orders for this visit:  Dysphagia, unspecified type -     Ambulatory Referral to Upper Endoscopy  Gastroesophageal reflux disease, unspecified whether esophagitis present -     Ambulatory Referral to Upper Endoscopy    1.  Dysphagia-was occurring at follow-up in February 2025 and now states symptoms have worsened and would like to proceed with endoscopy.  Endorses solids dysphagia denies liquid or pill dysphagia.  Last EGD was 2022.  She is not feeling a lot of reflux symptoms on pantoprazole 40 mg daily & diet modifications reviewed.  2.  Constipation-uses Trulance as needed.  Daily Trulance causes significant diarrhea.  Continue dietary fiber fluid intake.  No lower GI alarm symptoms.   Denies prior issues w/ sedation.   I reviewed the risks (including bleeding, perforation, infection, anesthesia complications, cardiac/respiratory complications), benefits and alternatives of EGD. Patient consents to proceed. Denies CP/SOB/blood thinners.    Attestation Statement:   I personally performed the service, non-incident to. [facility])   JANICE BRIDGES Martin, PA    Romero NOVAK. Mevelyn RIGGERS [facility]

## 2024-01-31 ENCOUNTER — Encounter

## 2024-01-31 ENCOUNTER — Ambulatory Visit

## 2024-01-31 DIAGNOSIS — K209 Esophagitis, unspecified without bleeding: Secondary | ICD-10-CM | POA: Diagnosis not present

## 2024-01-31 DIAGNOSIS — K2950 Unspecified chronic gastritis without bleeding: Secondary | ICD-10-CM | POA: Diagnosis not present

## 2024-02-13 ENCOUNTER — Other Ambulatory Visit: Payer: Self-pay | Admitting: Adult Health

## 2024-02-13 ENCOUNTER — Ambulatory Visit: Admitting: Urology

## 2024-02-13 ENCOUNTER — Encounter: Payer: Self-pay | Admitting: Urology

## 2024-02-13 VITALS — BP 131/83 | HR 109 | Ht 64.0 in | Wt 177.2 lb

## 2024-02-13 DIAGNOSIS — R3129 Other microscopic hematuria: Secondary | ICD-10-CM

## 2024-02-13 DIAGNOSIS — N3010 Interstitial cystitis (chronic) without hematuria: Secondary | ICD-10-CM | POA: Diagnosis not present

## 2024-02-13 LAB — URINALYSIS, COMPLETE
Bilirubin, UA: NEGATIVE
Glucose, UA: NEGATIVE
Ketones, UA: NEGATIVE
Leukocytes,UA: NEGATIVE
Nitrite, UA: NEGATIVE
Protein,UA: NEGATIVE
Specific Gravity, UA: 1.03 (ref 1.005–1.030)
Urobilinogen, Ur: 0.2 mg/dL (ref 0.2–1.0)
pH, UA: 6 (ref 5.0–7.5)

## 2024-02-13 LAB — MICROSCOPIC EXAMINATION: Epithelial Cells (non renal): >10 /HPF — AB (ref 0–10)

## 2024-02-13 MED ORDER — TRIMETHOPRIM 100 MG PO TABS: 100.0000 mg | ORAL_TABLET | Freq: Every day | ORAL | 11 refills | Status: AC

## 2024-02-13 NOTE — Progress Notes (Signed)
 02/13/2024 10:09 AM   Bethany Little 23-Mar-1986 [id number]  Referring provider: Adina Buel HERO, MD [address] [place],  [place] [address]  No chief complaint on file.   HPI: SH: Recurrent urinary tract infections last seen in 2017  Today Patient reports having up to 20 bladder infections a year with abdominal pain cramping and foul-smelling urine.  She says symptoms temporarily respond to antibiotics.  She says she was diagnosed age 38 is having in her cystitis with similar symptoms but was nonspecific.  She said it makes her not feel like she empties her bladder.  She said she stopped medications in 2015 due to side effects from [facility].   She normally voids every 2-3 hours gets up 3 times at night.  She sometimes leaks with coughing sneezing and with urgency.  Sometimes she leaks while she sleeping.  She wears 1 liner a day  Intercourse sometimes painful  Has not had a hysterectomy  She had a CT stone protocol that was normal in 2022.  She had a negative urine culture in 2021.    No history of kidney stones or bladder surgery.  No neurologic issue   PMH: Past Medical History:  Diagnosis Date   ADHD    Anxiety    Arthritis    Cervical abnormality    Cervical cancer (HCC) 2003   Chronic pain    Constipation    Depression    Heartburn    Hypertension    Interstitial cystitis    Leukemia (HCC)    Remission for 23 yrs   Migraines    Seizures (HCC)    Sleep apnea    Vaginal Pap smear, abnormal     Surgical History: Past Surgical History:  Procedure Laterality Date   anterior cervical adenopathy     BREAST BIOPSY Right 04/13/2016   u/s bx rt hydromark clip BENIGN BREAST TISSUE WITH FIBROADENOMATOUS CHANGE. NEGATIVE FOR ATYPIA AND MALIGNANCY.   CARDIAC CATHETERIZATION     2 weeks old   CESAREAN SECTION     COLONOSCOPY WITH PROPOFOL  N/A 02/10/2021   Procedure: COLONOSCOPY WITH PROPOFOL ;  Surgeon: Maryruth Ole DASEN, MD;  Location: [facility]  ENDOSCOPY;  Service: Endoscopy;  Laterality: N/A;   ESOPHAGOGASTRODUODENOSCOPY (EGD) WITH PROPOFOL  N/A 02/10/2021   Procedure: ESOPHAGOGASTRODUODENOSCOPY (EGD) WITH PROPOFOL ;  Surgeon: Maryruth Ole DASEN, MD;  Location: [facility] ENDOSCOPY;  Service: Endoscopy;  Laterality: N/A;   LAPAROSCOPIC APPENDECTOMY N/A 01/28/2020   Procedure: APPENDECTOMY LAPAROSCOPIC;  Surgeon: Kallie Manuelita JAYSON, MD;  Location: [facility];  Service: General;  Laterality: N/A;   LEEP     TUBAL LIGATION      Home Medications:  Allergies as of 02/13/2024       Reactions   Tetracycline Hcl Hives   Childhood allergy   Erythromycin Hives   Childhood allergy   Pertussis Vaccines Hives   Childhood allergy   Reglan [metoclopramide] Other (See Comments)   Muscle Spasms   Tetracyclines & Related Hives   Childhood allergy   Latex Itching, Rash        Medication List        Accurate as of February 13, 2024 10:09 AM. If you have any questions, ask your nurse or doctor.          BIOTIN PO Take by mouth.   cyclobenzaprine  10 MG tablet Commonly known as: FLEXERIL  Take 10 mg by mouth 3 (three) times daily as needed for muscle spasms.   diphenhydrAMINE  25 mg capsule Commonly  known as: BENADRYL  Take 25 mg by mouth.   Emgality  120 MG/ML Soaj Generic drug: Galcanezumab -gnlm INJECT 120 MG (1 SYRINGE) INTO THE SKIN EVERY 28 DAYS.   ibuprofen  800 MG tablet Commonly known as: ADVIL  TAKE 1 TABLET BY MOUTH EVERY 8 HOURS AS NEEDED   lamoTRIgine  200 MG tablet Commonly known as: LAMICTAL  Take 1 tablet (200 mg total) by mouth 2 (two) times daily.   Linzess 72 MCG capsule Generic drug: linaclotide Take 72 mcg by mouth every morning.   Nurtec 75 MG Tbdp Generic drug: Rimegepant Sulfate Take 1 tablet (75 mg total) by mouth daily as needed.   ondansetron  4 MG disintegrating tablet Commonly known as: ZOFRAN -ODT DISSOLVE 1 TABLET UNDER THE TONGUE EVERY 8 HOURS AS NEEDED FOR NAUSEA OR VOMITING   pantoprazole 40 MG  tablet Commonly known as: PROTONIX Take 40 mg by mouth daily.   promethazine  25 MG tablet Commonly known as: PHENERGAN  TAKE 1 TABLET BY MOUTH EVERY 6 HOURS AS NEEDED FOR NAUSEA OR VOMITING   SUMAtriptan  6 MG/0.5ML Soaj INJECT 0.5 ML INTO THE SKIN EVERY 2 HOURS AS NEEDED FOR MIGRAINE OR HEADACHEMAY REPEAT IN 2 HOURS IF HEADACHE PERSISTS OR RECURS        Allergies:  Allergies  Allergen Reactions   Tetracycline Hcl Hives    Childhood allergy   Erythromycin Hives    Childhood allergy   Pertussis Vaccines Hives    Childhood allergy   Reglan [Metoclopramide] Other (See Comments)    Muscle Spasms   Tetracyclines & Related Hives    Childhood allergy   Latex Itching and Rash    Family History: Family History  Problem Relation Age of Onset   Depression Mother    Bipolar disorder Mother    COPD Mother    Hypertension Mother    Mental illness Mother    Kidney disease Father        partial kidney fx   COPD Father    Pancreatic cancer Paternal Grandmother    Celiac disease Paternal Grandmother    Hypertension Paternal Grandmother    Heart failure Paternal Grandfather    Prostate cancer Other    Healthy Son    Bladder Cancer Neg Hx     Social History:  reports that she has been smoking cigarettes. She has a 10 pack-year smoking history. She has never used smokeless tobacco. She reports that she does not currently use alcohol. She reports current drug use. Drug: Marijuana.  ROS:                                        Physical Exam: There were no vitals taken for this visit.  Constitutional:  Alert and oriented, No acute distress. HEENT: [place] AT, moist mucus membranes.  Trachea midline, no masses. Cardiovascular: No clubbing, cyanosis, or edema. Respiratory: Normal respiratory effort, no increased work of breathing. GI: Abdomen is soft, nontender, nondistended, no abdominal masses GU: Mild grade 2 hypermobility bladder neck and negative cough test.   Levator muscles mildly tender.  Urethra mildly tender Skin: No rashes, bruises or suspicious lesions. Lymph: No cervical or inguinal adenopathy. Neurologic: Grossly intact, no focal deficits, moving all 4 extremities. Psychiatric: Normal mood and affect.  Laboratory Data: Lab Results  Component Value Date   WBC 10.0 12/21/2023   HGB 13.2 12/21/2023   HCT 39.3 12/21/2023   MCV 95.9 12/21/2023   PLT 271 12/21/2023  Lab Results  Component Value Date   CREATININE 0.91 12/21/2023    No results found for: PSA  No results found for: TESTOSTERONE  No results found for: HGBA1C  Urinalysis    Component Value Date/Time   COLORURINE YELLOW (A) 10/02/2020 1249   APPEARANCEUR HAZY (A) 10/02/2020 1249   APPEARANCEUR Clear 02/11/2020 1512   LABSPEC 1.016 10/02/2020 1249   LABSPEC 1.001 07/01/2014 1258   PHURINE 6.0 10/02/2020 1249   GLUCOSEU NEGATIVE 10/02/2020 1249   GLUCOSEU Negative 07/01/2014 1258   HGBUR NEGATIVE 10/02/2020 1249   BILIRUBINUR NEGATIVE 10/02/2020 1249   BILIRUBINUR Negative 02/11/2020 1512   BILIRUBINUR Negative 07/01/2014 1258   KETONESUR 20 (A) 10/02/2020 1249   PROTEINUR NEGATIVE 10/02/2020 1249   UROBILINOGEN 0.2 04/11/2014 1749   NITRITE NEGATIVE 10/02/2020 1249   LEUKOCYTESUR TRACE (A) 10/02/2020 1249   LEUKOCYTESUR Negative 07/01/2014 1258    Pertinent Imaging: Urine reviewed.  Urine sent for culture.  Chart reviewed  Assessment & Plan: Pathophysiology of UTIs discussed.  Role of prophylaxis discussed.  Role of ultrasound and cystoscopy discussed.  Patient may or may not have interstitial cystitis.  We will get urine cultures on prophylaxis moving forward.  She may or may not need urodynamics.  I looked through the medical record and I really could not find a lot on her treatment 10 years ago for IC.  I could not find positive cultures either.  She may have an element of interstitial cystitis  Because of microscopic hematuria she will have a  CT scan.  Call if positive.  Come back for pelvic examination and cystoscopy on trimethoprim  100 mg 3 x 11 she understands about getting culture  1. Interstitial cystitis (chronic) without hematuria (Primary)  - Urinalysis, Complete   No follow-ups on file.  Glendia DELENA Elizabeth, MD  [facility] [address], [address] [place], [place] [address] [phone]

## 2024-02-13 NOTE — Patient Instructions (Signed)

## 2024-02-15 LAB — CULTURE, URINE COMPREHENSIVE

## 2024-03-14 ENCOUNTER — Ambulatory Visit
Admission: RE | Admit: 2024-03-14 | Discharge: 2024-03-14 | Disposition: A | Discharge status: Home / Self Care | Source: Ambulatory Visit | Attending: Urology | Admitting: Urology

## 2024-03-14 DIAGNOSIS — R3129 Other microscopic hematuria: Secondary | ICD-10-CM | POA: Diagnosis present

## 2024-03-14 MED ORDER — IOHEXOL 350 MG/ML SOLN
100.0000 mL | Freq: Once | INTRAVENOUS | Status: AC | PRN
  Administered 2024-03-14: 100 mL via INTRAVENOUS

## 2024-04-08 ENCOUNTER — Encounter (HOSPITAL_COMMUNITY): Payer: Self-pay

## 2024-04-08 ENCOUNTER — Emergency Department (HOSPITAL_COMMUNITY)
Admission: EM | Admit: 2024-04-08 | Discharge: 2024-04-08 | Disposition: A | Discharge status: Home / Self Care | Attending: Emergency Medicine | Admitting: Emergency Medicine

## 2024-04-08 ENCOUNTER — Emergency Department (HOSPITAL_COMMUNITY)

## 2024-04-08 ENCOUNTER — Other Ambulatory Visit: Payer: Self-pay

## 2024-04-08 DIAGNOSIS — S0093XA Contusion of unspecified part of head, initial encounter: Secondary | ICD-10-CM | POA: Insufficient documentation

## 2024-04-08 DIAGNOSIS — R519 Headache, unspecified: Secondary | ICD-10-CM | POA: Diagnosis present

## 2024-04-08 DIAGNOSIS — Z91040 Latex allergy status: Secondary | ICD-10-CM | POA: Diagnosis not present

## 2024-04-08 DIAGNOSIS — W228XXA Striking against or struck by other objects, initial encounter: Secondary | ICD-10-CM | POA: Insufficient documentation

## 2024-04-08 DIAGNOSIS — G43919 Migraine, unspecified, intractable, without status migrainosus: Secondary | ICD-10-CM | POA: Diagnosis not present

## 2024-04-08 MED ORDER — KETOROLAC TROMETHAMINE 60 MG/2ML IM SOLN
30.0000 mg | Freq: Once | INTRAMUSCULAR | Status: AC
  Administered 2024-04-08: 30 mg via INTRAMUSCULAR
  Filled 2024-04-08: qty 2

## 2024-04-08 NOTE — ED Triage Notes (Signed)
 Pt c/o migraine since Tues when she hit her head on a shelf. Pt has taken her migraine meds without any relief.

## 2024-04-08 NOTE — Discharge Instructions (Signed)
 Return if any problems. Continue home mediacations

## 2024-04-10 NOTE — ED Provider Notes (Signed)
 [facility] AT [facility] Provider Note   CSN: [id number] Arrival date & time: 04/08/24  1307     Patient presents with: Migraine   Bethany Little is a 38 y.o. female.   Patient reports that she hit her head on a shelf 4 days ago.  Patient reports that she has since developed a migraine headache.  Patient reports that she has used her migraine medications without relief.  Patient reports the headache has gotten worse.  Patient states that headache is worse than usual.  Patient denies any nausea or vomiting she has not had any cough or congestion.  The history is provided by the patient. No language interpreter was used.  Migraine This is a new problem. The problem occurs constantly. Pertinent negatives include no abdominal pain.       Prior to Admission medications   Medication Sig Start Date End Date Taking? Authorizing Provider  BIOTIN PO Take by mouth.    [provider]  cyclobenzaprine  (FLEXERIL ) 10 MG tablet Take 10 mg by mouth 3 (three) times daily as needed for muscle spasms.    [provider]  diphenhydrAMINE  (BENADRYL ) 25 mg capsule Take 25 mg by mouth.    [provider]  Galcanezumab -gnlm (EMGALITY ) 120 MG/ML SOAJ INJECT 120 MG (1 SYRINGE) INTO THE SKIN EVERY 28 DAYS. 07/08/22   Skeet, Adam R, DO  ibuprofen  (ADVIL ) 800 MG tablet TAKE 1 TABLET BY MOUTH EVERY 8 HOURS AS NEEDED 02/13/24   Signa Nest A, NP  lamoTRIgine  (LAMICTAL ) 200 MG tablet Take 1 tablet (200 mg total) by mouth 2 (two) times daily. 08/13/22   Georjean Darice HERO, MD  LINZESS 72 MCG capsule Take 72 mcg by mouth every morning. 05/26/21   [provider]  ondansetron  (ZOFRAN -ODT) 4 MG disintegrating tablet DISSOLVE 1 TABLET UNDER THE TONGUE EVERY 8 HOURS AS NEEDED FOR NAUSEA OR VOMITING 11/30/21   Skeet, Adam R, DO  pantoprazole (PROTONIX) 40 MG tablet Take 40 mg by mouth daily.    [provider]  promethazine  (PHENERGAN ) 25 MG  tablet TAKE 1 TABLET BY MOUTH EVERY 6 HOURS AS NEEDED FOR NAUSEA OR VOMITING 11/09/22   Signa Nest LABOR, NP  Rimegepant Sulfate (NURTEC) 75 MG TBDP Take 1 tablet (75 mg total) by mouth daily as needed. 08/13/22   Jaffe, Adam R, DO  SUMAtriptan  6 MG/0.5ML SOAJ INJECT 0.5 ML INTO THE SKIN EVERY 2 HOURS AS NEEDED FOR MIGRAINE OR HEADACHEMAY REPEAT IN 2 HOURS IF HEADACHE PERSISTS OR RECURS 07/08/22   Skeet Cornet R, DO  trimethoprim  (TRIMPEX ) 100 MG tablet Take 1 tablet (100 mg total) by mouth daily. 02/13/24   Gaston Hamilton, MD    Allergies: Tetracycline hcl, Erythromycin, Pertussis vaccines, Reglan [metoclopramide], Tetracyclines & related, and Latex    Review of Systems  Gastrointestinal:  Negative for abdominal pain.  All other systems reviewed and are negative.   Updated Vital Signs BP 128/80 (BP Location: Right Arm)   Pulse 78   Temp 98 F (36.7 C) (Oral)   Resp 15   Ht 5' 4 (1.626 m)   Wt 79.8 kg   LMP 04/01/2024 (Approximate)   SpO2 96%   BMI 30.21 kg/m   Physical Exam Vitals and nursing note reviewed.  Constitutional:      Appearance: She is well-developed.  HENT:     Head: Normocephalic.  Cardiovascular:     Rate and Rhythm: Normal rate.  Pulmonary:     Effort: Pulmonary effort  is normal.  Abdominal:     General: There is no distension.  Musculoskeletal:        General: Normal range of motion.     Cervical back: Normal range of motion.  Skin:    General: Skin is warm.  Neurological:     General: No focal deficit present.     Mental Status: She is alert and oriented to person, place, and time.     (all labs ordered are listed, but only abnormal results are displayed) Labs Reviewed - No data to display  EKG: None  Radiology: CT Head Wo Contrast Result Date: 04/08/2024 EXAM: CT HEAD WITHOUT CONTRAST 04/08/2024 03:27:53 PM TECHNIQUE: CT of the head was performed without the administration of intravenous contrast. Automated exposure control, iterative  reconstruction, and/or weight based adjustment of the mA/kV was utilized to reduce the radiation dose to as low as reasonably achievable. COMPARISON: CT head 08/12/2023 CLINICAL HISTORY: Head trauma, moderate-severe FINDINGS: BRAIN AND VENTRICLES: No acute hemorrhage. No evidence of acute infarct. No hydrocephalus. No extra-axial collection. No mass effect or midline shift. ORBITS: No acute abnormality. SINUSES: No acute abnormality. SOFT TISSUES AND SKULL: No acute soft tissue abnormality. No skull fracture. IMPRESSION: 1. No acute intracranial abnormality. Electronically signed by: Gilmore Molt MD 04/08/2024 03:38 PM EST RP Workstation: [id number]     Procedures   Medications Ordered in the ED  ketorolac  (TORADOL ) injection 30 mg (30 mg Intramuscular Given 04/08/24 1641)                                    Medical Decision Making Pt complains of a persistent headache after being hit in the head.  Patient has a history of migraines.  She has tried all of her migraine medicines and they have not worked  Amount and/or Complexity of Data Reviewed Radiology: ordered and independent interpretation performed. Decision-making details documented in ED Course.    Details: CT head ordered reviewed and interpreted CT shows no acute findings  Risk Prescription drug management. Risk Details: Patient given a Toradol  injection.  She reports headache is feeling some better.  Patient is reassured that CT shows no sign of head injury.  Patient is advised to continue home medications return if any problems.        Final diagnoses:  Intractable migraine without status migrainosus, unspecified migraine type  Contusion of head, unspecified part of head, initial encounter    ED Discharge Orders     None     An After Visit Summary was printed and given to the patient.      Flint Sonny POUR, PA-C 04/10/24 1402    Yolande Lamar BROCKS, MD 04/11/24 [phone]

## 2024-04-30 ENCOUNTER — Other Ambulatory Visit: Payer: Self-pay | Admitting: Adult Health

## 2024-05-07 ENCOUNTER — Ambulatory Visit: Admitting: Urology

## 2024-05-07 VITALS — BP 138/92 | HR 105 | Ht 64.0 in | Wt 184.0 lb

## 2024-05-07 DIAGNOSIS — R3129 Other microscopic hematuria: Secondary | ICD-10-CM | POA: Diagnosis not present

## 2024-05-07 MED ORDER — LIDOCAINE HCL URETHRAL/MUCOSAL 2 % EX GEL
1.0000 | Freq: Once | CUTANEOUS | Status: AC
  Administered 2024-05-07: 10:00:00 1 via URETHRAL

## 2024-05-07 NOTE — Progress Notes (Signed)
 05/07/2024 10:24 AM   Bethany Little [birth year] [id number]  Referring provider: Adina Buel HERO, MD [address] [place],  [place] [address]  Chief Complaint  Patient presents with   Cysto    HPI: Patient reports having up to 20 bladder infections a year with abdominal pain cramping and foul-smelling urine.  She says symptoms temporarily respond to antibiotics.  She says she was diagnosed age 38 is having in her cystitis with similar symptoms but was nonspecific.  She said it makes her not feel like she empties her bladder.  She said she stopped medications in 2015 due to side effects from [facility].    She normally voids every 2-3 hours gets up 3 times at night.  She sometimes leaks with coughing sneezing and with urgency.  Sometimes she leaks while she sleeping.  She wears 1 liner a day   Intercourse sometimes painful   Has not had a hysterectomy   She had a CT stone protocol that was normal in 2022.  She had a negative urine culture in 2021.      Pathophysiology of UTIs discussed.  Role of prophylaxis discussed.  Role of ultrasound and cystoscopy discussed.  Patient may or may not have interstitial cystitis.  We will get urine cultures on prophylaxis moving forward.  She may or may not need urodynamics.  I looked through the medical record and I really could not find a lot on her treatment 10 years ago for IC.  I could not find positive cultures either.  She may have an element of interstitial cystitis   Because of microscopic hematuria she will have a CT scan.  Call if positive.  Come back for pelvic examination and cystoscopy on trimethoprim  100 mg 3 x 11 she understands about getting culture  Today CT scan normal.  Last culture negative Patient doing very well on trimethoprim  and no UTIs On pelvic examination she had grade 2 hypermobility the bladder neck and negative cough test after cystoscopy.  No tenderness.  No prolapse Patient underwent flexible cystoscopy.   Bladder Koza and trigone were normal.  No cystitis.  No carcinoma.  No pain on insertion or bladder filling   PMH: Past Medical History:  Diagnosis Date   ADHD    Anxiety    Arthritis    Cervical abnormality    Cervical cancer (HCC) 2003   Chronic pain    Constipation    Depression    Heartburn    Hypertension    Interstitial cystitis    Leukemia (HCC)    Remission for 23 yrs   Migraines    Seizures (HCC)    Sleep apnea    Vaginal Pap smear, abnormal     Surgical History: Past Surgical History:  Procedure Laterality Date   anterior cervical adenopathy     BREAST BIOPSY Right 04/13/2016   u/s bx rt hydromark clip BENIGN BREAST TISSUE WITH FIBROADENOMATOUS CHANGE. NEGATIVE FOR ATYPIA AND MALIGNANCY.   CARDIAC CATHETERIZATION     30 weeks old   CESAREAN SECTION     COLONOSCOPY WITH PROPOFOL  N/A 02/10/2021   Procedure: COLONOSCOPY WITH PROPOFOL ;  Surgeon: Maryruth Ole DASEN, MD;  Location: [facility] ENDOSCOPY;  Service: Endoscopy;  Laterality: N/A;   ESOPHAGOGASTRODUODENOSCOPY (EGD) WITH PROPOFOL  N/A 02/10/2021   Procedure: ESOPHAGOGASTRODUODENOSCOPY (EGD) WITH PROPOFOL ;  Surgeon: Maryruth Ole DASEN, MD;  Location: [facility] ENDOSCOPY;  Service: Endoscopy;  Laterality: N/A;   LAPAROSCOPIC APPENDECTOMY N/A 01/28/2020   Procedure: APPENDECTOMY LAPAROSCOPIC;  Surgeon:  Kallie Manuelita BROCKS, MD;  Location: [facility];  Service: General;  Laterality: N/A;   LEEP     TUBAL LIGATION      Home Medications:  Allergies as of 05/07/2024       Reactions   Tetracycline Hcl Hives   Childhood allergy   Erythromycin Hives   Childhood allergy   Pertussis Vaccines Hives   Childhood allergy   Reglan [metoclopramide] Other (See Comments)   Muscle Spasms   Tetracyclines & Related Hives   Childhood allergy   Latex Itching, Rash        Medication List        Accurate as of May 07, 2024 10:24 AM. If you have any questions, ask your nurse or doctor.          amitriptyline 25 MG  tablet Commonly known as: ELAVIL Take 25 mg by mouth at bedtime.   BIOTIN PO Take by mouth.   cyclobenzaprine  10 MG tablet Commonly known as: FLEXERIL  Take 10 mg by mouth 3 (three) times daily as needed for muscle spasms.   diphenhydrAMINE  25 mg capsule Commonly known as: BENADRYL  Take 25 mg by mouth.   Emgality  120 MG/ML Soaj Generic drug: Galcanezumab -gnlm INJECT 120 MG (1 SYRINGE) INTO THE SKIN EVERY 28 DAYS.   ibuprofen  800 MG tablet Commonly known as: ADVIL  TAKE 1 TABLET BY MOUTH EVERY 8 HOURS AS NEEDED   lamoTRIgine  200 MG tablet Commonly known as: LAMICTAL  Take 1 tablet (200 mg total) by mouth 2 (two) times daily.   Linzess 72 MCG capsule Generic drug: linaclotide Take 72 mcg by mouth every morning.   Nurtec 75 MG Tbdp Generic drug: Rimegepant Sulfate Take 1 tablet (75 mg total) by mouth daily as needed.   ondansetron  4 MG disintegrating tablet Commonly known as: ZOFRAN -ODT DISSOLVE 1 TABLET UNDER THE TONGUE EVERY 8 HOURS AS NEEDED FOR NAUSEA OR VOMITING   ondansetron  4 MG tablet Commonly known as: ZOFRAN  Take 4 mg by mouth every 8 (eight) hours as needed.   pantoprazole 40 MG tablet Commonly known as: PROTONIX Take 40 mg by mouth daily.   promethazine  25 MG tablet Commonly known as: PHENERGAN  TAKE 1 TABLET BY MOUTH EVERY 6 HOURS AS NEEDED FOR NAUSEA OR VOMITING   SUMAtriptan  6 MG/0.5ML Soaj INJECT 0.5 ML INTO THE SKIN EVERY 2 HOURS AS NEEDED FOR MIGRAINE OR HEADACHEMAY REPEAT IN 2 HOURS IF HEADACHE PERSISTS OR RECURS   trimethoprim  100 MG tablet Commonly known as: TRIMPEX  Take 1 tablet (100 mg total) by mouth daily.        Allergies: Allergies[1]  Family History: Family History  Problem Relation Age of Onset   Depression Mother    Bipolar disorder Mother    COPD Mother    Hypertension Mother    Mental illness Mother    Kidney disease Father        partial kidney fx   COPD Father    Pancreatic cancer Paternal Grandmother    Celiac  disease Paternal Grandmother    Hypertension Paternal Grandmother    Heart failure Paternal Grandfather    Prostate cancer Other    Healthy Son    Bladder Cancer Neg Hx     Social History:  reports that she has been smoking cigarettes. She has a 10 pack-year smoking history. She has never used smokeless tobacco. She reports that she does not currently use alcohol. She reports current drug use. Drug: Marijuana.  ROS:  Physical Exam: BP (!) 138/92 (BP Location: Left Arm, Patient Position: Sitting, Cuff Size: Normal)   Pulse (!) 105   Ht 5' 4 (1.626 m)   Wt 83.5 kg   LMP 04/01/2024 (Approximate)   SpO2 98%   BMI 31.58 kg/m   Constitutional:  Alert and oriented, No acute distress. HEENT: [place] AT, moist mucus membranes.  Trachea midline, no masses.   Laboratory Data: Lab Results  Component Value Date   WBC 10.0 12/21/2023   HGB 13.2 12/21/2023   HCT 39.3 12/21/2023   MCV 95.9 12/21/2023   PLT 271 12/21/2023    Lab Results  Component Value Date   CREATININE 0.91 12/21/2023    No results found for: PSA  No results found for: TESTOSTERONE  No results found for: HGBA1C  Urinalysis    Component Value Date/Time   COLORURINE YELLOW (A) 10/02/2020 1249   APPEARANCEUR Clear 02/13/2024 1009   LABSPEC 1.016 10/02/2020 1249   LABSPEC 1.001 07/01/2014 1258   PHURINE 6.0 10/02/2020 1249   GLUCOSEU Negative 02/13/2024 1009   GLUCOSEU Negative 07/01/2014 1258   HGBUR NEGATIVE 10/02/2020 1249   BILIRUBINUR Negative 02/13/2024 1009   BILIRUBINUR Negative 07/01/2014 1258   KETONESUR 20 (A) 10/02/2020 1249   PROTEINUR Negative 02/13/2024 1009   PROTEINUR NEGATIVE 10/02/2020 1249   UROBILINOGEN 0.2 04/11/2014 1749   NITRITE Negative 02/13/2024 1009   NITRITE NEGATIVE 10/02/2020 1249   LEUKOCYTESUR Negative 02/13/2024 1009   LEUKOCYTESUR TRACE (A) 10/02/2020 1249   LEUKOCYTESUR Negative 07/01/2014 1258     Pertinent Imaging: Urine reviewed and sent for culture  Assessment & Plan: 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  Reassess in 6 months.  Call if culture positive.  Get cultures if she flares.  1. Microhematuria (Primary)  - Urinalysis, Complete - lidocaine  (XYLOCAINE ) 2 % jelly 1 Application   No follow-ups on file.  Glendia DELENA Elizabeth, MD  [facility] [address], [address] [place], [place] [address] [phone]     [1]  Allergies Allergen Reactions   Tetracycline Hcl Hives    Childhood allergy   Erythromycin Hives    Childhood allergy   Pertussis Vaccines Hives    Childhood allergy   Reglan [Metoclopramide] Other (See Comments)    Muscle Spasms   Tetracyclines & Related Hives    Childhood allergy   Latex Itching and Rash

## 2024-05-08 LAB — URINALYSIS, COMPLETE
Bilirubin, UA: NEGATIVE
Glucose, UA: NEGATIVE
Ketones, UA: NEGATIVE
Leukocytes,UA: NEGATIVE
Nitrite, UA: NEGATIVE
Protein,UA: NEGATIVE
Specific Gravity, UA: 1.025 (ref 1.005–1.030)
Urobilinogen, Ur: 0.2 mg/dL (ref 0.2–1.0)
pH, UA: 6 (ref 5.0–7.5)

## 2024-05-08 LAB — MICROSCOPIC EXAMINATION: Epithelial Cells (non renal): >10 /HPF — AB (ref 0–10)

## 2024-05-10 LAB — CULTURE, URINE COMPREHENSIVE

## 2024-06-29 ENCOUNTER — Other Ambulatory Visit: Payer: Self-pay | Admitting: Family Medicine

## 2024-06-29 DIAGNOSIS — Z1231 Encounter for screening mammogram for malignant neoplasm of breast: Secondary | ICD-10-CM

## 2024-06-29 DIAGNOSIS — N6489 Other specified disorders of breast: Secondary | ICD-10-CM

## 2024-07-18 ENCOUNTER — Encounter

## 2024-07-18 ENCOUNTER — Other Ambulatory Visit

## 2024-11-05 ENCOUNTER — Ambulatory Visit: Admitting: Urology
